# Patient Record
Sex: Female | Born: 1950 | Race: White | Hispanic: No | Marital: Single | Smoking: Never smoker
Health system: Southern US, Community
[De-identification: ages and names within clinical notes are randomized; demographics above are authoritative.]

## PROBLEM LIST (undated history)

## (undated) DIAGNOSIS — E785 Hyperlipidemia, unspecified: Secondary | ICD-10-CM

## (undated) DIAGNOSIS — T7840XA Allergy, unspecified, initial encounter: Secondary | ICD-10-CM

## (undated) DIAGNOSIS — Z923 Personal history of irradiation: Secondary | ICD-10-CM

## (undated) DIAGNOSIS — H9190 Unspecified hearing loss, unspecified ear: Secondary | ICD-10-CM

## (undated) DIAGNOSIS — K219 Gastro-esophageal reflux disease without esophagitis: Secondary | ICD-10-CM

## (undated) DIAGNOSIS — M1990 Unspecified osteoarthritis, unspecified site: Secondary | ICD-10-CM

## (undated) DIAGNOSIS — H269 Unspecified cataract: Secondary | ICD-10-CM

## (undated) DIAGNOSIS — D126 Benign neoplasm of colon, unspecified: Secondary | ICD-10-CM

## (undated) DIAGNOSIS — Z9221 Personal history of antineoplastic chemotherapy: Secondary | ICD-10-CM

## (undated) DIAGNOSIS — K648 Other hemorrhoids: Secondary | ICD-10-CM

## (undated) DIAGNOSIS — IMO0002 Reserved for concepts with insufficient information to code with codable children: Secondary | ICD-10-CM

## (undated) DIAGNOSIS — IMO0001 Reserved for inherently not codable concepts without codable children: Secondary | ICD-10-CM

## (undated) DIAGNOSIS — Z86010 Personal history of colonic polyps: Principal | ICD-10-CM

## (undated) DIAGNOSIS — C801 Malignant (primary) neoplasm, unspecified: Secondary | ICD-10-CM

## (undated) DIAGNOSIS — Z973 Presence of spectacles and contact lenses: Secondary | ICD-10-CM

## (undated) DIAGNOSIS — I10 Essential (primary) hypertension: Secondary | ICD-10-CM

## (undated) DIAGNOSIS — E789 Disorder of lipoprotein metabolism, unspecified: Secondary | ICD-10-CM

## (undated) DIAGNOSIS — K5790 Diverticulosis of intestine, part unspecified, without perforation or abscess without bleeding: Secondary | ICD-10-CM

## (undated) DIAGNOSIS — M25562 Pain in left knee: Secondary | ICD-10-CM

## (undated) HISTORY — DX: Unspecified cataract: H26.9

## (undated) HISTORY — DX: Reserved for concepts with insufficient information to code with codable children: IMO0002

## (undated) HISTORY — DX: Other hemorrhoids: K64.8

## (undated) HISTORY — DX: Pain in left knee: M25.562

## (undated) HISTORY — DX: Allergy, unspecified, initial encounter: T78.40XA

## (undated) HISTORY — DX: Diverticulosis of intestine, part unspecified, without perforation or abscess without bleeding: K57.90

## (undated) HISTORY — PX: KNEE SURGERY: SHX244

## (undated) HISTORY — PX: POLYPECTOMY: SHX149

## (undated) HISTORY — DX: Reserved for inherently not codable concepts without codable children: IMO0001

## (undated) HISTORY — PX: UPPER GASTROINTESTINAL ENDOSCOPY: SHX188

## (undated) HISTORY — DX: Personal history of colonic polyps: Z86.010

## (undated) HISTORY — DX: Disorder of lipoprotein metabolism, unspecified: E78.9

## (undated) HISTORY — DX: Benign neoplasm of colon, unspecified: D12.6

## (undated) HISTORY — PX: TONSILLECTOMY AND ADENOIDECTOMY: SHX28

## (undated) HISTORY — DX: Essential (primary) hypertension: I10

## (undated) HISTORY — PX: BREAST LUMPECTOMY: SHX2

## (undated) HISTORY — PX: HEMORRHOID SURGERY: SHX153

## (undated) HISTORY — DX: Hyperlipidemia, unspecified: E78.5

## (undated) HISTORY — PX: TREATMENT FISTULA ANAL: SUR1390

## (undated) HISTORY — PX: BREAST EXCISIONAL BIOPSY: SUR124

## (undated) HISTORY — PX: OTHER SURGICAL HISTORY: SHX169

---

## 1996-09-06 ENCOUNTER — Encounter: Payer: Self-pay | Admitting: Orthopedic Surgery

## 1997-12-05 ENCOUNTER — Ambulatory Visit (HOSPITAL_COMMUNITY): Admission: RE | Admit: 1997-12-05 | Discharge: 1997-12-05 | Payer: Self-pay | Admitting: Obstetrics & Gynecology

## 1998-06-11 ENCOUNTER — Ambulatory Visit (HOSPITAL_COMMUNITY): Admission: RE | Admit: 1998-06-11 | Discharge: 1998-06-11 | Payer: Self-pay | Admitting: Obstetrics & Gynecology

## 1998-06-11 ENCOUNTER — Encounter: Payer: Self-pay | Admitting: Obstetrics & Gynecology

## 1998-11-11 ENCOUNTER — Ambulatory Visit (HOSPITAL_COMMUNITY): Admission: RE | Admit: 1998-11-11 | Discharge: 1998-11-11 | Payer: Self-pay | Admitting: Obstetrics & Gynecology

## 1999-04-08 ENCOUNTER — Encounter: Payer: Self-pay | Admitting: Internal Medicine

## 1999-04-08 ENCOUNTER — Ambulatory Visit (HOSPITAL_COMMUNITY): Admission: RE | Admit: 1999-04-08 | Discharge: 1999-04-08 | Payer: Self-pay | Admitting: Internal Medicine

## 1999-05-01 ENCOUNTER — Encounter: Admission: RE | Admit: 1999-05-01 | Discharge: 1999-07-30 | Payer: Self-pay | Admitting: Internal Medicine

## 1999-06-18 ENCOUNTER — Ambulatory Visit (HOSPITAL_COMMUNITY): Admission: RE | Admit: 1999-06-18 | Discharge: 1999-06-18 | Payer: Self-pay | Admitting: Obstetrics & Gynecology

## 1999-06-18 ENCOUNTER — Encounter: Payer: Self-pay | Admitting: Obstetrics & Gynecology

## 1999-08-27 ENCOUNTER — Other Ambulatory Visit: Admission: RE | Admit: 1999-08-27 | Discharge: 1999-08-27 | Payer: Self-pay | Admitting: Obstetrics & Gynecology

## 2000-06-23 ENCOUNTER — Ambulatory Visit (HOSPITAL_COMMUNITY): Admission: RE | Admit: 2000-06-23 | Discharge: 2000-06-23 | Payer: Self-pay | Admitting: Obstetrics & Gynecology

## 2000-06-23 ENCOUNTER — Encounter: Payer: Self-pay | Admitting: Obstetrics & Gynecology

## 2000-07-06 HISTORY — PX: COLONOSCOPY: SHX174

## 2000-08-30 ENCOUNTER — Other Ambulatory Visit: Admission: RE | Admit: 2000-08-30 | Discharge: 2000-08-30 | Payer: Self-pay | Admitting: Obstetrics & Gynecology

## 2001-01-25 ENCOUNTER — Ambulatory Visit (HOSPITAL_COMMUNITY): Admission: RE | Admit: 2001-01-25 | Discharge: 2001-01-25 | Payer: Self-pay | Admitting: Gastroenterology

## 2001-06-24 ENCOUNTER — Encounter: Payer: Self-pay | Admitting: Obstetrics & Gynecology

## 2001-06-24 ENCOUNTER — Ambulatory Visit (HOSPITAL_COMMUNITY): Admission: RE | Admit: 2001-06-24 | Discharge: 2001-06-24 | Payer: Self-pay | Admitting: Obstetrics & Gynecology

## 2001-09-06 ENCOUNTER — Other Ambulatory Visit: Admission: RE | Admit: 2001-09-06 | Discharge: 2001-09-06 | Payer: Self-pay | Admitting: Obstetrics & Gynecology

## 2002-01-17 ENCOUNTER — Ambulatory Visit (HOSPITAL_COMMUNITY): Admission: RE | Admit: 2002-01-17 | Discharge: 2002-01-17 | Payer: Self-pay | Admitting: Internal Medicine

## 2002-07-19 ENCOUNTER — Ambulatory Visit (HOSPITAL_COMMUNITY): Admission: RE | Admit: 2002-07-19 | Discharge: 2002-07-19 | Payer: Self-pay | Admitting: Obstetrics & Gynecology

## 2002-07-19 ENCOUNTER — Encounter: Payer: Self-pay | Admitting: Obstetrics & Gynecology

## 2002-09-14 ENCOUNTER — Other Ambulatory Visit: Admission: RE | Admit: 2002-09-14 | Discharge: 2002-09-14 | Payer: Self-pay | Admitting: Obstetrics & Gynecology

## 2002-09-15 ENCOUNTER — Other Ambulatory Visit: Admission: RE | Admit: 2002-09-15 | Discharge: 2002-09-15 | Payer: Self-pay | Admitting: Obstetrics & Gynecology

## 2003-01-11 ENCOUNTER — Ambulatory Visit (HOSPITAL_COMMUNITY): Admission: RE | Admit: 2003-01-11 | Discharge: 2003-01-11 | Payer: Self-pay | Admitting: Internal Medicine

## 2003-01-11 ENCOUNTER — Encounter: Payer: Self-pay | Admitting: Internal Medicine

## 2003-07-25 ENCOUNTER — Ambulatory Visit (HOSPITAL_COMMUNITY): Admission: RE | Admit: 2003-07-25 | Discharge: 2003-07-25 | Payer: Self-pay | Admitting: Obstetrics & Gynecology

## 2003-07-31 ENCOUNTER — Ambulatory Visit (HOSPITAL_COMMUNITY): Admission: RE | Admit: 2003-07-31 | Discharge: 2003-07-31 | Payer: Self-pay | Admitting: Cardiology

## 2003-09-19 ENCOUNTER — Other Ambulatory Visit: Admission: RE | Admit: 2003-09-19 | Discharge: 2003-09-19 | Payer: Self-pay | Admitting: Obstetrics & Gynecology

## 2003-12-17 ENCOUNTER — Ambulatory Visit (HOSPITAL_COMMUNITY): Admission: RE | Admit: 2003-12-17 | Discharge: 2003-12-17 | Payer: Self-pay | Admitting: Internal Medicine

## 2004-07-25 ENCOUNTER — Ambulatory Visit (HOSPITAL_COMMUNITY): Admission: RE | Admit: 2004-07-25 | Discharge: 2004-07-25 | Payer: Self-pay | Admitting: Obstetrics & Gynecology

## 2004-09-25 ENCOUNTER — Other Ambulatory Visit: Admission: RE | Admit: 2004-09-25 | Discharge: 2004-09-25 | Payer: Self-pay | Admitting: Obstetrics & Gynecology

## 2005-07-27 ENCOUNTER — Ambulatory Visit (HOSPITAL_COMMUNITY): Admission: RE | Admit: 2005-07-27 | Discharge: 2005-07-27 | Payer: Self-pay | Admitting: Obstetrics & Gynecology

## 2006-08-03 ENCOUNTER — Ambulatory Visit (HOSPITAL_COMMUNITY): Admission: RE | Admit: 2006-08-03 | Discharge: 2006-08-03 | Payer: Self-pay | Admitting: Obstetrics & Gynecology

## 2006-08-18 ENCOUNTER — Ambulatory Visit (HOSPITAL_COMMUNITY): Admission: RE | Admit: 2006-08-18 | Discharge: 2006-08-18 | Payer: Self-pay | Admitting: Obstetrics & Gynecology

## 2006-11-09 ENCOUNTER — Emergency Department (HOSPITAL_COMMUNITY): Admission: EM | Admit: 2006-11-09 | Discharge: 2006-11-09 | Payer: Self-pay | Admitting: Emergency Medicine

## 2007-07-05 ENCOUNTER — Ambulatory Visit (HOSPITAL_COMMUNITY): Admission: RE | Admit: 2007-07-05 | Discharge: 2007-07-05 | Payer: Self-pay | Admitting: Internal Medicine

## 2007-08-23 ENCOUNTER — Ambulatory Visit (HOSPITAL_COMMUNITY): Admission: RE | Admit: 2007-08-23 | Discharge: 2007-08-23 | Payer: Self-pay | Admitting: Obstetrics & Gynecology

## 2007-08-26 ENCOUNTER — Emergency Department (HOSPITAL_COMMUNITY): Admission: EM | Admit: 2007-08-26 | Discharge: 2007-08-26 | Payer: Self-pay | Admitting: Emergency Medicine

## 2008-07-06 HISTORY — PX: BREAST EXCISIONAL BIOPSY: SUR124

## 2008-08-24 ENCOUNTER — Ambulatory Visit (HOSPITAL_COMMUNITY): Admission: RE | Admit: 2008-08-24 | Discharge: 2008-08-24 | Payer: Self-pay | Admitting: Obstetrics & Gynecology

## 2009-05-07 ENCOUNTER — Ambulatory Visit: Payer: Self-pay | Admitting: Orthopedic Surgery

## 2009-05-07 DIAGNOSIS — M23329 Other meniscus derangements, posterior horn of medial meniscus, unspecified knee: Secondary | ICD-10-CM

## 2009-05-07 DIAGNOSIS — M23302 Other meniscus derangements, unspecified lateral meniscus, unspecified knee: Secondary | ICD-10-CM

## 2009-05-08 ENCOUNTER — Ambulatory Visit (HOSPITAL_COMMUNITY): Admission: RE | Admit: 2009-05-08 | Discharge: 2009-05-08 | Payer: Self-pay | Admitting: Orthopedic Surgery

## 2009-05-09 ENCOUNTER — Ambulatory Visit: Payer: Self-pay | Admitting: Orthopedic Surgery

## 2009-06-11 ENCOUNTER — Ambulatory Visit: Payer: Self-pay | Admitting: Orthopedic Surgery

## 2009-06-11 DIAGNOSIS — M23302 Other meniscus derangements, unspecified lateral meniscus, unspecified knee: Secondary | ICD-10-CM | POA: Insufficient documentation

## 2009-06-11 DIAGNOSIS — M1710 Unilateral primary osteoarthritis, unspecified knee: Secondary | ICD-10-CM

## 2009-06-17 ENCOUNTER — Encounter (INDEPENDENT_AMBULATORY_CARE_PROVIDER_SITE_OTHER): Payer: Self-pay | Admitting: *Deleted

## 2009-06-18 ENCOUNTER — Ambulatory Visit: Payer: Self-pay | Admitting: Orthopedic Surgery

## 2009-06-18 ENCOUNTER — Ambulatory Visit (HOSPITAL_COMMUNITY): Admission: RE | Admit: 2009-06-18 | Discharge: 2009-06-18 | Payer: Self-pay | Admitting: Orthopedic Surgery

## 2009-06-24 ENCOUNTER — Ambulatory Visit: Payer: Self-pay | Admitting: Orthopedic Surgery

## 2009-07-02 ENCOUNTER — Encounter: Payer: Self-pay | Admitting: Orthopedic Surgery

## 2009-07-04 ENCOUNTER — Encounter: Admission: RE | Admit: 2009-07-04 | Discharge: 2009-07-04 | Payer: Self-pay | Admitting: Orthopedic Surgery

## 2009-07-04 ENCOUNTER — Encounter: Payer: Self-pay | Admitting: Orthopedic Surgery

## 2009-07-09 ENCOUNTER — Ambulatory Visit: Payer: Self-pay | Admitting: Orthopedic Surgery

## 2009-07-09 ENCOUNTER — Telehealth: Payer: Self-pay | Admitting: Orthopedic Surgery

## 2009-07-09 DIAGNOSIS — M25469 Effusion, unspecified knee: Secondary | ICD-10-CM | POA: Insufficient documentation

## 2009-07-11 ENCOUNTER — Encounter: Admission: RE | Admit: 2009-07-11 | Discharge: 2009-09-13 | Payer: Self-pay | Admitting: Orthopedic Surgery

## 2009-07-22 ENCOUNTER — Encounter: Payer: Self-pay | Admitting: Orthopedic Surgery

## 2009-07-23 ENCOUNTER — Encounter: Payer: Self-pay | Admitting: Orthopedic Surgery

## 2009-07-24 ENCOUNTER — Ambulatory Visit: Payer: Self-pay | Admitting: Orthopedic Surgery

## 2009-07-25 ENCOUNTER — Telehealth: Payer: Self-pay | Admitting: Orthopedic Surgery

## 2009-07-31 ENCOUNTER — Encounter: Payer: Self-pay | Admitting: Orthopedic Surgery

## 2009-08-29 ENCOUNTER — Ambulatory Visit (HOSPITAL_COMMUNITY): Admission: RE | Admit: 2009-08-29 | Discharge: 2009-08-29 | Payer: Self-pay | Admitting: Obstetrics & Gynecology

## 2009-10-29 ENCOUNTER — Ambulatory Visit: Payer: Self-pay | Admitting: Orthopedic Surgery

## 2009-11-26 ENCOUNTER — Ambulatory Visit: Payer: Self-pay | Admitting: Orthopedic Surgery

## 2010-01-08 ENCOUNTER — Ambulatory Visit: Payer: Self-pay | Admitting: Orthopedic Surgery

## 2010-01-09 ENCOUNTER — Telehealth: Payer: Self-pay | Admitting: Orthopedic Surgery

## 2010-01-10 ENCOUNTER — Ambulatory Visit (HOSPITAL_COMMUNITY): Admission: RE | Admit: 2010-01-10 | Discharge: 2010-01-10 | Payer: Self-pay | Admitting: Orthopedic Surgery

## 2010-01-28 ENCOUNTER — Ambulatory Visit: Payer: Self-pay | Admitting: Orthopedic Surgery

## 2010-01-28 DIAGNOSIS — M23349 Other meniscus derangements, anterior horn of lateral meniscus, unspecified knee: Secondary | ICD-10-CM | POA: Insufficient documentation

## 2010-02-06 ENCOUNTER — Telehealth (INDEPENDENT_AMBULATORY_CARE_PROVIDER_SITE_OTHER): Payer: Self-pay | Admitting: *Deleted

## 2010-02-06 ENCOUNTER — Encounter: Payer: Self-pay | Admitting: Orthopedic Surgery

## 2010-02-11 ENCOUNTER — Encounter (INDEPENDENT_AMBULATORY_CARE_PROVIDER_SITE_OTHER): Payer: Self-pay | Admitting: *Deleted

## 2010-02-17 ENCOUNTER — Encounter (INDEPENDENT_AMBULATORY_CARE_PROVIDER_SITE_OTHER): Payer: Self-pay | Admitting: *Deleted

## 2010-02-19 ENCOUNTER — Encounter: Payer: Self-pay | Admitting: Orthopedic Surgery

## 2010-02-24 ENCOUNTER — Encounter: Payer: Self-pay | Admitting: Orthopedic Surgery

## 2010-02-25 ENCOUNTER — Ambulatory Visit (HOSPITAL_COMMUNITY): Admission: RE | Admit: 2010-02-25 | Discharge: 2010-02-25 | Payer: Self-pay | Admitting: Orthopedic Surgery

## 2010-02-25 ENCOUNTER — Ambulatory Visit: Payer: Self-pay | Admitting: Orthopedic Surgery

## 2010-02-26 ENCOUNTER — Encounter: Payer: Self-pay | Admitting: Orthopedic Surgery

## 2010-02-27 ENCOUNTER — Ambulatory Visit: Payer: Self-pay | Admitting: Orthopedic Surgery

## 2010-02-27 DIAGNOSIS — Z9889 Other specified postprocedural states: Secondary | ICD-10-CM | POA: Insufficient documentation

## 2010-02-28 ENCOUNTER — Encounter (HOSPITAL_COMMUNITY): Admission: RE | Admit: 2010-02-28 | Discharge: 2010-03-30 | Payer: Self-pay | Admitting: Orthopedic Surgery

## 2010-03-03 ENCOUNTER — Encounter: Admission: RE | Admit: 2010-03-03 | Discharge: 2010-06-01 | Payer: Self-pay | Admitting: Orthopedic Surgery

## 2010-03-05 ENCOUNTER — Encounter: Payer: Self-pay | Admitting: Orthopedic Surgery

## 2010-03-12 ENCOUNTER — Encounter: Payer: Self-pay | Admitting: Orthopedic Surgery

## 2010-03-17 ENCOUNTER — Encounter: Payer: Self-pay | Admitting: Orthopedic Surgery

## 2010-03-19 ENCOUNTER — Ambulatory Visit: Payer: Self-pay | Admitting: Orthopedic Surgery

## 2010-03-20 ENCOUNTER — Telehealth: Payer: Self-pay | Admitting: Orthopedic Surgery

## 2010-03-20 ENCOUNTER — Encounter (INDEPENDENT_AMBULATORY_CARE_PROVIDER_SITE_OTHER): Payer: Self-pay | Admitting: *Deleted

## 2010-04-16 ENCOUNTER — Ambulatory Visit: Payer: Self-pay | Admitting: Orthopedic Surgery

## 2010-04-16 ENCOUNTER — Encounter (INDEPENDENT_AMBULATORY_CARE_PROVIDER_SITE_OTHER): Payer: Self-pay | Admitting: *Deleted

## 2010-04-21 ENCOUNTER — Encounter: Payer: Self-pay | Admitting: Orthopedic Surgery

## 2010-07-06 HISTORY — PX: KNEE SURGERY: SHX244

## 2010-07-16 ENCOUNTER — Ambulatory Visit
Admission: RE | Admit: 2010-07-16 | Discharge: 2010-07-16 | Payer: Self-pay | Source: Home / Self Care | Attending: Orthopedic Surgery | Admitting: Orthopedic Surgery

## 2010-07-27 ENCOUNTER — Encounter: Payer: Self-pay | Admitting: Obstetrics & Gynecology

## 2010-07-27 ENCOUNTER — Encounter: Payer: Self-pay | Admitting: Gastroenterology

## 2010-08-05 NOTE — Assessment & Plan Note (Signed)
Summary: NEW PROB/LT KNEE PAIN/NEEDS XRAY/UMR/CAF   Vital Signs:  Patient profile:   60 year old female Weight:      217 pounds  Visit Type:  new problem Referring Provider:  self Primary Provider:  Dr. Nila Nephew  CC:  left knee pain.  History of Present Illness: I saw April Walker in the office today for a  visit.  She is a 60 years old woman with the complaint of: left knee pain.  Xrays today in our office.  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  She has sharp throbbing pain which reaches the level of an 8.  It is intermittent it came on gradually is better when she is elevating the leg and worse when she is walking.  She complains of catching and swelling.  She is already on an anti-inflammatory     Allergies: 1)  ! Sulfa 2)  ! * Actifed 3)  ! 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 Cardiovascular:  Denies chest pain, palpitations, fainting, and murmurs. Respiratory:  Denies short of breath, wheezing, couch, tightness, pain on inspiration, and snoring . Gastrointestinal:  Denies heartburn, nausea, vomiting, diarrhea, constipation, and blood in your stools. Genitourinary:  Denies frequency, urgency, difficulty urinating, painful urination, flank pain, and bleeding in urine. Neurologic:  Denies numbness, tingling, unsteady gait, dizziness, tremors, and seizure. Musculoskeletal:  Denies joint pain, swelling, instability,  stiffness, redness, heat, and muscle pain. Endocrine:  Denies excessive thirst, exessive urination, and heat or cold intolerance. Psychiatric:  Denies nervousness, depression, anxiety, and hallucinations. Skin:  Denies changes in the skin, poor healing, rash, itching, and redness. HEENT:  Denies blurred or double vision, eye pain, redness, and watering. Immunology:  Complains of seasonal allergies; denies sinus problems and allergic to bee stings. Hemoatologic:  Denies easy bleeding and brusing.  Physical Exam  Msk:  The patient is well developed and nourished, with normal grooming and hygiene. The body habitus is   medium/large Pulses:  The pulses and perfusion were normal with normal color, temperature  and no swelling  Extremities:  upper extremities I see no abnormalities, no contracture no subluxation no atrophy no tremor  LEFT lower extremity is medial joint line tenderness patellofemoral crepitus no effusion, range of motion normal, strength normal, stability normal, Murray sign seems positive. Neurologic:  The coordination and sensation were normal  The reflexes were normal   Skin:  intact without lesions or rashes Inguinal Nodes:  no significant adenopathy Psych:  alert and cooperative; normal mood and affect; normal attention span and concentration   Impression & Recommendations:  Problem # 1:  KNEE, ARTHRITIS, DEGEN./OSTEO (ICD-715.96) Assessment New  LEFT knee Her updated medication list for this problem includes:    Lorcet Plus 7.5-650 Mg Tabs (Hydrocodone-acetaminophen) .Marland Kitchen... 1 by mouth q 4 as needed pain    Diclofenac Potassium 50 Mg Tabs (Diclofenac potassium) .Marland Kitchen... 1 by mouth two times a day  Orders: Est. Patient Level IV (45409) Knee x-ray,  3 views (81191) Joint Aspirate / Injection, Large (  [address]) Depo- Medrol 40mg  (J1030)  Problem # 2:  DERANGEMENT MENISCUS (ICD-717.5) Assessment: New  LEFT knee  X-rays 3 views LEFT knee show medial joint space  narrowing with bone to bone changes varus deformity.  Patellofemoral joint looks moderately arthritic.  Orders: Est. Patient Level IV (60454) Knee x-ray,  3 views (09811) LEFT knee joint injection Verbal consent was obtained. The knee was prepped with alcohol and ethyl chloride. 1 cc of depomedrol 40mg /cc and 4 cc of lidocaine 1% was injected. there were no complications.  Patient Instructions: 1)  You have received an injection of cortisone today. You may experience increased pain at the injection site. Apply ice pack to the area for 20 minutes every 2 hours and take 2 xtra strength tylenol every 8 hours. This increased pain will usually resolve in 24 hours. The injection will take effect in 3-10 days.  2)  MRI left knee f/u with film

## 2010-08-05 NOTE — Miscellaneous (Signed)
Summary: PT Clinical evaluation  PT Clinical evaluation   Imported By: Jacklynn Ganong 03/05/2010 08:11:54  _____________________________________________________________________  External Attachment:    Type:   Image     Comment:   External Document

## 2010-08-05 NOTE — Letter (Signed)
Summary: Work [facility]  [address]  [place], [place] [address]   Phone: [phone]  Fax: [phone]     Today's Date: October 29, 2009  Name of Patient: April Walker [facility]  The above named patient had a medical visit today at:  4:15 pm.  Please take the following into consideration:.    Special Instructions:  [ X ] To be returning to work today, 10/29/09, following appointment.   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   Sincerely yours,   Terrance Mass, MD

## 2010-08-05 NOTE — Progress Notes (Signed)
Summary: Phone calls following appointment today  Phone Note Outgoing Call   Call placed to: Specialist Summary of Call: (1) Called [facility] therapy dept @[address] location, ph [phone], per Dr Romeo Apple, and cancelled pt's appointment there for today; spoke w/ Vernona Rieger; okay for Thursday as scheduled. (2) Faxed work note to [employer], [place] dept fax [phone], per patient request. Initial call taken by: Cammie Sickle,  July 09, 2009 4:01 PM

## 2010-08-05 NOTE — Letter (Signed)
Summary: Medical office notes DOS [phone] fax [facility]  Medical office notes DOS [phone] fax [facility]   Imported By: Cammie Sickle 07/23/2009 17:24:47  _____________________________________________________________________  External Attachment:    Type:   Image     Comment:   External Document

## 2010-08-05 NOTE — Progress Notes (Signed)
Summary: note for work  Phone Note Call from Patient   Summary of Call: Patient called and would like her letter faxed to her supervisor at [phone] attn. Diane Coad.  She needs the out of work note, stating how long she is out of work.  Thanks Initial call taken by: Curtis Sites,  March 20, 2010 8:56 AM  Follow-up for Phone Call        Done, as we have authorization on file. Follow-up by: Cammie Sickle,  March 20, 2010 9:40 AM

## 2010-08-05 NOTE — Miscellaneous (Signed)
Summary: Rehab Report  Rehab Report   Imported By: Cammie Sickle 03/19/2010 18:12:02  _____________________________________________________________________  External Attachment:    Type:   Image     Comment:   External Document

## 2010-08-05 NOTE — Assessment & Plan Note (Signed)
Summary: RE-CK LT KNEE/POST OP SURG 02/25/10/UMR/CAF   Visit Type:  Follow-up Referring Keyonta Barradas:  self Primary Akyah Lagrange:  Dr. Nila Nephew  CC:  post op knee.  History of Present Illness:   She is a 60 years old woman with the complaint of:  post op left knee, SALK, PMM, Limited Synovectomy.  DOS 02/25/10.  Diclofenac bid Norco 5 only as needed.  OOW until Oct 17th  Today is 4 week recheck after PT.  Doing well, finished PT.  Her knee looks really good.  He moves free and easy and has near full range of motion.  There is no pain or crepitance with range of motion and she does not have a joint effusion  Excellent progress status post arthroscopy patient will start work on October 17 followup in about 3 months       Allergies: 1)  ! Sulfa 2)  ! * Actifed 3)  ! Hydrocodone   Other Orders: Post-Op Check ([address])  Patient Instructions: 1)  OK TO RTW 10 17 2011 2)  RETURN IN 3 MONTHS

## 2010-08-05 NOTE — Letter (Signed)
Summary: Out of Work  [facility]  [address]  [place], [place] [address]   Phone: [phone]  Fax: [phone]    July 09, 2009   Employee:  April Walker [facility]    To Whom It May Concern:   For Medical reasons, please excuse the above named employee from work for the following dates:  Start:   July 09, 2009  End:   July 24, 2009  Approximate return to work date:  July 31, 2009  If you need additional information, please feel free to contact our office.         Sincerely,    Terrance Mass, MD

## 2010-08-05 NOTE — Progress Notes (Signed)
Summary: call from patient re-schedule surgery date   Phone Note Call from Patient   Caller: Patient Summary of Call: Patient called back to relay that she needs to re-schedule surgery date from 02/11/10 to 02/18/10 if possible due to work schedule.  Please call at work ph # [phone] Initial call taken by: Cammie Sickle,  February 06, 2010 10:30 AM  Follow-up for Phone Call        rescheduled to 02/18/10 at 1020am, preop is 02/14/10 at 2:15pm, post op 1 in our office 10:30am on 02/20/10 Follow-up by: Ether Griffins,  February 06, 2010 10:44 AM

## 2010-08-05 NOTE — Letter (Signed)
Summary: Out of Work  [facility]  [address]  [place], [place] [address]   Phone: [phone]  Fax: [phone]    April 16, 2010   Employee:  April Walker [facility]    To Whom It May Concern:   For Medical reasons, please note that the above named patient/employee is to continue out of work as follows, and return to full duty work as follows, as per appointment in our office today.:   Start:   04/16/10  End/Return to work, no restrictions:  04/21/10  If you need additional information, please feel free to contact our office.         Sincerely,    Terrance Mass, MD

## 2010-08-05 NOTE — Letter (Signed)
Summary: History form  History form   Imported By: Jacklynn Ganong 01/13/2010 13:27:58  _____________________________________________________________________  External Attachment:    Type:   Image     Comment:   External Document

## 2010-08-05 NOTE — Miscellaneous (Signed)
Summary: PT Progress note  PT Progress note   Imported By: Jacklynn Ganong 04/21/2010 08:16:25  _____________________________________________________________________  External Attachment:    Type:   Image     Comment:   External Document

## 2010-08-05 NOTE — Assessment & Plan Note (Signed)
Summary: 1 m RE-CK LT KNEE/UMR/CAF   Visit Type:  Follow-up  CC:  recheck right knee.  History of Present Illness: a 60 year old female had arthroscopy of the RIGHT knee on June 18, 2009; 5 months postop  Procedure  arthroscopy, partial medial menisectomy, partial lateral menisectomy, chondroplasty, medial femoral condyle, chondroplasty lateral femoral condyle, and extensive synovectomy.   Medication  Ibuprofen 800 as needed, Lorcet plus made her hallucinate.   Today is a one month fu after Ibuprofen increase. 1 by mouth:  q8 to q 6   She has stiffness and pain right knee.  Has swelling left foot, no injury, just over compensation with weightbearing on the LEFT.                     Allergies: 1)  ! Sulfa 2)  ! * Actifed 3)  ! Hydrocodone  Review of Systems Musculoskeletal:  Complains of joint pain, swelling, stiffness, and muscle pain; denies instability, redness, and heat.   Knee Exam  General:    Well-developed, well-nourished, normal body habitus; no deformities, normal grooming.  Gait:    Normal heel-toe gait pattern bilaterally.    Skin:    Intact, no scars, lesions, rashes, cafe au lait spots, or bruising.    Inspection:    swelling: RIGHT knee joint  Palpation:    lateral joint line tenderness  Vascular:    There was no swelling or varicose veins. The pulses and temperature are normal. There was no edema or tenderness.  Sensory:    Gross coordination and sensation were normal.    Motor:    Motor strength 5/5 bilaterally for quadriceps, hamstrings, ankle dorsiflexion, and ankle plantar flexion.    Knee Exam:    Right:    Inspection:  Abnormal    Palpation:  Abnormal    Stability:  stable    flexion work of 120   Impression & Recommendations:  Problem # 1:  AFTERCARE FOLLOW SURGERY MUSCULOSKEL SYSTEM NEC (ICD-V58.78) Assessment New  Orders: Est. Patient Level III (42595) Joint Aspirate / Injection, Large  (20610) Depo- Medrol 40mg  (J1030)  Problem # 2:  JOINT EFFUSION, KNEE (ICD-719.06) Assessment: New  recurrent joint effusion RIGHT knee  Aspirated RIGHT knee 50 cc of fluid, injected Depo-Medrol Verbal consent was obtained. The knee was prepped with alcohol and ethyl chloride. 1 cc of depomedrol 40mg /cc and 4 cc of lidocaine 1% was injected. there were no complications.  Orders: Est. Patient Level III (63875) Joint Aspirate / Injection, Large (20610) Depo- Medrol 40mg  (J1030)  Medications Added to Medication List This Visit: 1)  Diclofenac Potassium 50 Mg Tabs (Diclofenac potassium) .Marland Kitchen.. 1 by mouth two times a day  Patient Instructions: 1)  Try an over the counter support hose for the swelling in the ankles  2)  You have received an injection of cortisone today. You may experience increased pain at the injection site. Apply ice pack to the area for 20 minutes every 2 hours and take 2 xtra strength tylenol every 8 hours. This increased pain will usually resolve in 24 hours. The injection will take effect in 3-10 days.  3)  Take diclofenac 50 two times a day  4)  return in 2 months  Prescriptions: DICLOFENAC POTASSIUM 50 MG TABS (DICLOFENAC POTASSIUM) 1 by mouth two times a day  #60 x 1   Entered and Authorized by:   [name] MD   Signed by:   [name] MD on 11/26/2009   Method used:  Print then Give to Patient   RxID:   [id number]

## 2010-08-05 NOTE — Assessment & Plan Note (Signed)
Summary: 2 M RE-CK RT KNEE/MRI RESULTS LEFT KNEE/UMR/BSF   Visit Type:  Follow-up Referring Provider:  self Primary Provider:  Dr. Nila Nephew  CC:  left knee pain.  History of Present Illness: I saw April Walker in the office today for a  visit.  She is a 60 years old woman with the complaint of: left knee pain.  Xrays today in our office.  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  She has sharp throbbing pain which reaches the level of an 8.  It is intermittent it came on gradually is better when she is elevating the leg and worse when she is walking.  She complains of catching and swelling.  She is already on an anti-inflammatory   MRI was done to evaluate LEFT knee:  1.  Tricompartmental degenerative changes, most significant medially. 2.  Intact ligamentous structures and no acute bony findings. 3.  Medial and lateral meniscal tears. 4.  Moderate to large joint effusion and moderate sized, partially ruptured, Baker's cyst. 5.  Pes anserinus bursitis.  She complains that her knee doesn't feel good.    Allergies: 1)  ! Sulfa 2)  ! * Actifed 3)  ! Hydrocodone  Physical Exam  Additional Exam:   * VS reviewed and were normal   *GEN: appearance was normal   ** CDV: normal pulses temperature and no edema  * LYMPH nodes were normal   * SKIN was normal   * Neuro: normal sensation ** Psyche: AAO x 3 and mood was normal   MSK *Gait was normal  *Inspection LEFT knee tenderness over the medial compartment. *ROM knee flexion 125 mild flexion contracture *Motor 5/5 *Stability normal special tests include McMurray's sign which is positive  RIGHT knee mild tenderness medial joint line full range of motion except for mild flexion contracture motor exam normal knee  stable   The upper extremities have normal appearance, ROM, strength and stability.     Impression & Recommendations:  Problem # 1:  KNEE, ARTHRITIS, DEGEN./OSTEO (ICD-715.96)  Her updated medication list for this problem includes:    Lorcet Plus 7.5-650 Mg Tabs (Hydrocodone-acetaminophen) .Marland Kitchen... 1 by mouth q 4 as needed pain    Diclofenac Potassium 50 Mg Tabs (Diclofenac potassium) .Marland Kitchen... 1 by mouth two times a day  Orders: Est. Patient Level IV ([address]109)  Problem # 2:  DERANGEMENT OF POSTERIOR HORN OF MEDIAL MENISCUS (ICD-717.2)  Orders: Est. Patient Level IV (60454)  Problem # 3:  DERANGEMENT OF ANTERIOR HORN OF LATERAL MENISCUS (ICD-717.42)  recommend: SALK medial and lateral  menisectomy  As the patient has undergone this procedure before we just reviewed again were going to do in the risk and complications she agrees to have the surgery, although we expect some mild discomfort afterwards we should be able to help her with some of her mechanical symptoms and improve her ability to walk.  Orders: Est. Patient Level IV (09811)  Patient Instructions: 1)  call to schedule  2)  will not need appt just pick up packet  Prescriptions: DICLOFENAC POTASSIUM 50 MG TABS (DICLOFENAC POTASSIUM) 1 by mouth two times a day  #60 x 1   Entered and Authorized by:   [name] MD   Signed by:   [name] MD on 01/28/2010   Method used:  Print then Give to Patient   RxID:   [id number] LORCET PLUS 7.5-650 MG TABS (HYDROCODONE-ACETAMINOPHEN) 1 by mouth q 4 as needed pain  #60 x 1   Entered and Authorized by:   [name] MD   Signed by:   [name] MD on 01/28/2010   Method used:   Print then Give to Patient   RxID:   [id number]

## 2010-08-05 NOTE — Miscellaneous (Signed)
Summary: reschedule surgery to 02/25/10   Clinical Lists Changes    post op 1 is now 02/27/10 at 930 am, preop is 02/20/10 at 845am

## 2010-08-05 NOTE — Miscellaneous (Signed)
Summary: PT Discharge Summary  PT Discharge Summary   Imported By: Jacklynn Ganong 08/08/2009 16:56:43  _____________________________________________________________________  External Attachment:    Type:   Image     Comment:   External Document

## 2010-08-05 NOTE — Letter (Signed)
Summary: FMLA form  FMLA form   Imported By: Cammie Sickle 03/18/2010 18:04:47  _____________________________________________________________________  External Attachment:    Type:   Image     Comment:   External Document

## 2010-08-05 NOTE — Progress Notes (Signed)
Summary: MRI appointment.  Phone Note Outgoing Call   Call placed by: Waldon Reining,  January 09, 2010 3:31 PM Call placed to: Patient Action Taken: Appt scheduled Summary of Call: I called to give the patient her MRI appointment at [facility] on 01-10-10 at 3:30. Patient has UMR, no precert required. She will follow up here for results.

## 2010-08-05 NOTE — Miscellaneous (Signed)
Summary: call back to [employer],no prior auth req'd for out-pt proc  Clinical Lists Changes  Call back to insurer [employer] re: pre-cert for re-scheduled date of 02/25/10 for out-pt surgery at [facility], [place] [id number].  Left voice message as requested on automated system at 4:58pm Followed up and reached representative re: updated surgery date, spoke w/Lisa.  Ref # [id number]

## 2010-08-05 NOTE — Assessment & Plan Note (Signed)
Summary: post op 1 left knee.cbt   Visit Type:  Follow-up Referring Provider:  self Primary Provider:  Dr. Nila Nephew  CC:  post op 1 left knee.  History of Present Illness: I saw April Walker in the office today for a followup visit.  She is a 60 years old woman with the complaint of:  post op 1 left knee, SALK, PMM, Limited Synovectomy.  DOS 02/25/10.  POD 2.  Today for dressing change, check wound.  Lorcet plus for pain, 1 by mouth q 4 hrs.  Tomorrow starting PT [facility], to switch to [facility].  Walking toe touch with crutches.  doing ok   knee looks good   return 3 weeks       Allergies: 1)  ! Sulfa 2)  ! * Actifed 3)  ! Hydrocodone   Impression & Recommendations:  Problem # 1:  DERANGEMENT OF POSTERIOR HORN OF MEDIAL MENISCUS (ICD-717.2) Assessment Comment Only  Orders: Post-Op Check (78295)  Problem # 2:  KNEE, ARTHRITIS, DEGEN./OSTEO (ICD-715.96) Assessment: Comment Only  Her updated medication list for this problem includes:    Lorcet Plus 7.5-650 Mg Tabs (Hydrocodone-acetaminophen) .Marland Kitchen... 1 by mouth q 4 as needed pain    Diclofenac Potassium 50 Mg Tabs (Diclofenac potassium) .Marland Kitchen... 1 by mouth two times a day  Orders: Post-Op Check (62130)  Problem # 3:  ARTHROSCOPY, KNEE, HX OF (ICD-V45.89) Assessment: Comment Only  Orders: Post-Op Check (86578)  Patient Instructions: 1)  Please schedule a follow-up appointment in 3 weeks.

## 2010-08-05 NOTE — Assessment & Plan Note (Signed)
Summary: 2 WK RE-CK RT KNEE/POST OP SURG 06/18/09/UMR/CAF   Visit Type:  Follow-up  CC:  knee pain .  History of Present Illness: I saw April Walker in the office today for a followup visit.  She is a 60 years old woman with the complaint of:  DOS right knee 06/18/09.  5 weeks out   Procedure  arthroscopy, partial medial menisectomy, partial lateral menisectomy, chondroplasty, medial femoral condyle, chondroplasty lateral femoral condyle, and extensive synovectomy.   Medication Lorcet plus and Ibuprofen, takes Ibuprofen, does not take Lorcet much.  Subjectives postop 3, recheck right knee after aspiration, injection and rest.  she has good days and bad days but the good days are out numbering the bad days.  She does have some mediolateral discomfort.  She is ready to go back to work.  Her knee looks pretty good there is minimal swelling good flexion extension.  Good quadriceps tone  Assessment progressing well status post arthroscopy of the RIGHT knee with extensive synovectomy and chondroplasties and partial medial lateral meniscectomy  Return to work after completing therapy                Allergies: 1)  ! Sulfa 2)  ! * Actifed   Impression & Recommendations:  Problem # 1:  AFTERCARE FOLLOW SURGERY MUSCULOSKEL SYSTEM NEC (ICD-V58.78) Assessment Improved  Orders: Post-Op Check (23762)  Problem # 2:  KNEE, ARTHRITIS, DEGEN./OSTEO (ICD-715.96) Assessment: Improved  Her updated medication list for this problem includes:    Lorcet Plus 7.5-650 Mg Tabs (Hydrocodone-acetaminophen) .Marland Kitchen... 1 by mouth q 4 as needed pain  Orders: Post-Op Check (83151)  Patient Instructions: 1)  RTW 26th  2)  finish therapy  3)  Continue the ibuprofen as needed  4)  return in 3 months

## 2010-08-05 NOTE — Letter (Signed)
Summary: Out of Work  [facility]  [address]  [place], [place] [address]   Phone: [phone]  Fax: [phone]    July 24, 2009   Employee:  April Walker [facility]    To Whom It May Concern:   For Medical reasons, please excuse the above named employee from work for the following dates:  Start:   July 24, 2009 (original start date out of work:  June 18, 2009)  End:   July 30, 2009  Medically cleared to return to work, full duty:   July 31, 2009   If you need additional information, please feel free to contact our office.         Sincerely,    Terrance Mass, MD

## 2010-08-05 NOTE — Assessment & Plan Note (Signed)
Summary: 3 WK RE-CK/POST OP 2/LT KNEE SURG 02/25/10/[facility]   Visit Type:  Follow-up Referring Provider:  self Primary Provider:  Dr. Nila Nephew  CC:  post op left knee.  History of Present Illness: I saw April Walker in the office today for a followup visit.  She is a 60 years old woman with the complaint of:  post op 1 left knee, SALK, PMM, Limited Synovectomy.  DOS 02/25/10.  POD 22  Diclofenac helps, no pain med needed.  Today is 3 week recheck left knee after PT.  Therapy ends thursday, note for review, needs to be signed.  OOW until Oct 18th   She looks really good. Her knee looks good. Her flexion is good. She has minimal swelling. No pain really  Followup in 4 weeks after finishing therapy    Allergies: 1)  ! Sulfa 2)  ! * Actifed 3)  ! Hydrocodone   Impression & Recommendations:  Problem # 1:  ARTHROSCOPY, KNEE, HX OF (ICD-V45.89)  Orders: Post-Op Check (40981)  Problem # 2:  DERANGEMENT OF ANTERIOR HORN OF LATERAL MENISCUS (ICD-717.42)  Orders: Post-Op Check (19147)  Problem # 3:  DERANGEMENT OF POSTERIOR HORN OF MEDIAL MENISCUS (ICD-717.2)  Orders: Post-Op Check (82956)  Problem # 4:  KNEE, ARTHRITIS, DEGEN./OSTEO (ICD-715.96)  Her updated medication list for this problem includes:    Lorcet Plus 7.5-650 Mg Tabs (Hydrocodone-acetaminophen) .Marland Kitchen... 1 by mouth q 4 as needed pain    Diclofenac Potassium 50 Mg Tabs (Diclofenac potassium) .Marland Kitchen... 1 by mouth two times a day  Orders: Post-Op Check (21308)  Patient Instructions: 1)  continue PT  2)  return in 4 weeks

## 2010-08-05 NOTE — Letter (Signed)
Summary: Out of Work  [facility]  [address]  [place], [place] [address]   Phone: [phone]  Fax: [phone]    March 20, 2010   Employee:  April Walker [facility]    To Whom It May Concern:  The above named patient/employee was seen in our office on 03/19/10  for an appointment in our office.  For Medical reasons, please excuse the above named employee from work for the following dates:  Start:   February 24, 2010  End/Approximate Return to work date:   April 21, 2010   If you need additional information, please feel free to contact our office.         Sincerely,    Terrance Mass, MD

## 2010-08-05 NOTE — Miscellaneous (Signed)
Summary: No pre-authorization req'd for out-patient surgery  Clinical Lists Changes   Call to insurer [employer] re: out-patient surgery scheduled 02/18/10 at [facility], [place] [address], [phone] do not require pre-authorization if done as out-patient.  Spoke w/Mohammed, Office manager.  Ref # [id number].

## 2010-08-05 NOTE — Letter (Signed)
Summary: surgery order LT knee sched 02/18/10  surgery order LT knee sched 02/18/10   Imported By: Cammie Sickle 02/11/2010 10:10:23  _____________________________________________________________________  External Attachment:    Type:   Image     Comment:   External Document

## 2010-08-05 NOTE — Miscellaneous (Signed)
Summary: PT discharge summary  PT discharage summary   Imported By: Jacklynn Ganong 08/05/2009 11:20:15  _____________________________________________________________________  External Attachment:    Type:   Image     Comment:   External Document

## 2010-08-05 NOTE — Letter (Signed)
Summary: Out of Work  [facility]  [address]  [place], [place] [address]   Phone: [phone]  Fax: [phone]    February 19, 2010   Employee:  FERGIE SHERBERT [facility]    To Whom It May Concern:   For Medical reasons, please excuse the above named employee from work for the following dates:  Start:   February 24, 2010  End:   April 21, 2010 (approximate) - or until otherwise notified  If you need additional information, please feel free to contact our office.         Sincerely,    Terrance Mass, MD

## 2010-08-05 NOTE — Miscellaneous (Signed)
CC:  left knee pain.  History of Present Illness: I saw April Walker in the office today for a  visit.  She is a 60 years old woman with the complaint of: left knee pain.  Xrays today in our office.  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  She has sharp throbbing pain which reaches the level of an 8.  It is intermittent it came on gradually is better when she is elevating the leg and worse when she is walking.  She complains of catching and swelling.  She is already on an anti-inflammatory   MRI was done to evaluate LEFT knee:  1.  Tricompartmental degenerative changes, most significant medially. 2.  Intact ligamentous structures and no acute bony findings. 3.  Medial and lateral meniscal tears. 4.  Moderate to large joint effusion and moderate sized, partially ruptured, Baker's cyst. 5.  Pes anserinus bursitis.  She complains that her knee doesn't feel good.    Allergies (verified): 1)  ! Sulfa 2)  ! * Actifed  Past History:  Past Surgical History: Total Abdominal Hysterectomy  Review of Systems General:  Denies weight loss, weight gain, fever, chills, and fatigue. Cardiac :  Denies chest pain, angina, heart attack, heart failure, poor circulation, blood clots, and phlebitis. Resp:  Denies short of breath, difficulty breathing, COPD, cough, and pneumonia. GI:  Complains of reflux; denies nausea, vomiting, diarrhea, constipation, difficulty swallowing, ulcers, and GERD. GU:  Denies kidney failure, kidney transplant, kidney stones, burning, poor stream, testicular cancer, blood in urine, and . Neuro:  Denies headache, dizziness, migraines, numbness, weakness, tremor, and unsteady walking. MS:  Denies joint pain, rheumatoid arthritis, joint swelling, gout, bone cancer,  osteoporosis, and . Endo:  Denies thyroid disease, goiter, and diabetes. Psych:  Denies depression, mood swings, anxiety, panic attack, bipolar, and schizophrenia. Derm:  Denies eczema, cancer, and itching. EENT:  Denies poor vision, cataracts, glaucoma, poor hearing, vertigo, ears ringing, sinusitis, hoarseness, toothaches, and bleeding gums. Immunology:  Complains of seasonal allergies and allergic to bee stings; denies sinus problems. Lymphatic:  Denies lymph node cancer and lymph edema.  Physical Exam  Additional Exam:   * VS reviewed and were normal   *GEN: appearance was normal   ** CDV: normal pulses temperature and no edema  * LYMPH nodes were normal   * SKIN was normal   * Neuro: normal sensation ** Psyche: AAO x 3 and mood was normal   MSK *Gait was normal  *Inspection LEFT knee tenderness over the medial compartment. *ROM knee flexion 125 mild flexion contracture *Motor 5/5 *Stability normal special tests include McMurray's sign which is positive  RIGHT knee mild tenderness medial joint line full range of motion except for mild flexion contracture motor exam normal knee stable   The upper extremities have normal appearance, ROM, strength and stability.     Impression & Recommendations:  Problem # 1:  KNEE, ARTHRITIS, DEGEN./OSTEO (ICD-715.96)  Her updated medication list for this problem includes:    Lorcet Plus 7.5-650 Mg Tabs (Hydrocodone-acetaminophen) .Marland Kitchen... 1 by mouth q 4 as needed pain    Diclofenac Potassium 50 Mg Tabs (Diclofenac potassium) .Marland Kitchen... 1 by mouth two times a day  Orders: Est. Patient Level IV ([address])  Problem # 2:  DERANGEMENT OF POSTERIOR HORN OF MEDIAL MENISCUS (ICD-717.2)  Orders: Est. Patient Level IV (21308)  Problem # 3:  DERANGEMENT OF ANTERIOR HORN OF LATERAL MENISCUS (ICD-717.42)  recommend: SALK medial and lateral  menisectomy  As the patient has undergone this procedure before we just reviewed again were going to do in  the risk and complications she agrees to have the surgery, although we expect some mild discomfort afterwards we should be able to help her with some of her mechanical symptoms and improve her ability to walk.

## 2010-08-05 NOTE — Letter (Signed)
Summary: [employer] disab form  [employer] disab form   Imported By: Cammie Sickle 03/18/2010 17:59:34  _____________________________________________________________________  External Attachment:    Type:   Image     Comment:   External Document

## 2010-08-05 NOTE — Progress Notes (Signed)
Summary: Medical records DOS 07/24/09 and work note fax  Phone Note Outgoing Call   Call placed to: Insurer Summary of Call: Faxed office notes of 07/24/09 to [employer] short term disability today per patient request - Fax [phone], Ph [phone]. Faxed work note to employer, [employer], [facility] Att[employer] HR Dept - Fax [phone] per patient request.  Auth's on file. Initial call taken by: Cammie Sickle,  July 25, 2009 12:07 PM

## 2010-08-05 NOTE — Assessment & Plan Note (Signed)
Summary: 3 M RE-CK RT KNEE/SURG 06/18/09/[facility]/CAF   Visit Type:  Follow-up  CC:  3 month recheck rt knee post op.  History of Present Illness: a 60 year old female had arthroscopy of the RIGHT knee on June 18, 2009  Procedure  arthroscopy, partial medial menisectomy, partial lateral menisectomy, chondroplasty, medial femoral condyle, chondroplasty lateral femoral condyle, and extensive synovectomy.   Medication  Ibuprofen 800 as needed, Lorcet plus made her hallucinate.  she has some pain and swelling that come and go she takes ibuprofen on an as-needed basis.  She does have some reflux.  She does take occasional Prilosec.  She is ALLERGIC to sulfa.  She has no catching locking or giving way but pain and swelling seem to be the main findings  She does have some tenderness in the lateral compartment which is expected based on the arthritis we saw on the lateral side.  She has good range of motion.  No joint effusion.  Knee stable.  Recommend increase in the ibuprofen after she's taken some Prilosec and then continue Prilosec.  Follow up in a month to see if this does help.                  Allergies: 1)  ! Sulfa 2)  ! * Actifed 3)  ! Hydrocodone   Impression & Recommendations:  Problem # 1:  AFTERCARE FOLLOW SURGERY MUSCULOSKEL SYSTEM NEC (ICD-V58.78)  Orders: Post-Op Check (88416)  Problem # 2:  KNEE, ARTHRITIS, DEGEN./OSTEO (ICD-715.96)  Her updated medication list for this problem includes:    Lorcet Plus 7.5-650 Mg Tabs (Hydrocodone-acetaminophen) .Marland Kitchen... 1 by mouth q 4 as needed pain  Orders: Post-Op Check (60630)  Problem # 3:  UNSPECIFIED DERANGEMENT OF LATERAL MENISCUS (ICD-717.40)  Orders: Post-Op Check (16010)  Problem # 4:  DERANGEMENT OF POSTERIOR HORN OF MEDIAL MENISCUS (ICD-717.2)  Orders: Post-Op Check (93235)  Patient Instructions: 1)  Take prilosec for 5 days then start ibuprofen and continue prilosec x 1 month  2)  Please  schedule a follow-up appointment in 1 month.

## 2010-08-05 NOTE — Assessment & Plan Note (Signed)
Summary: POST OP 2/2 WK RE-CK RT KNEE/ARTH SURG 06/18/09/UMR/CAF   Visit Type:  Follow-up  CC:  post op .  History of Present Illness: I saw April Walker in the office today for a followup visit.  She is a 60 years old woman with the complaint of:  continued pain and swelling in the RIGHT knee.  DOS 06/18/09 right knee.  Procedure   arthroscopy, partial medial menisectomy, partial lateral menisectomy, chondroplasty, medial femoral condyle, chondroplasty lateral femoral condyle, and extensive synovectomy.  Medication:  Lorcet plus and Ibuprofen.  She has good and bad days. pain seems to be the problem / inferior patella and lateral side of the knee  Swelling: yes using cryotherapy.    calf pain is better     Physical examination of her RIGHT knee. She does have a joint effusion. Her flexion is limited to about 100 she does not come to full extension appears to have a mild 5 loss of extension.  Her knee remained stable.  Aspiration of 50 cc of clear, yellow fluid was performed under sterile conditions.  Assessment postop swelling and giving rehabilitation recommend aspiration and injection and then followed with physical therapy starting after today.  She'll stay out of work another 3 weeks , see me in 2 weeks.      Allergies: 1)  ! Sulfa 2)  ! * Actifed   Impression & Recommendations:  Problem # 1:  JOINT EFFUSION, KNEE (ICD-719.06)  aspiration RIGHT knee with injection, aspiration of 50 cc  Verbal consent was obtained. The knee was prepped with alcohol and ethyl chloride. 1 cc of depomedrol 40mg /cc and 4 cc of lidocaine 1% was injected. there were no complications.  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 1 by mouth q 4 as needed pain  Orders: Post-Op Check (81191)  Problem # 3:   UNSPECIFIED DERANGEMENT OF LATERAL MENISCUS (ICD-717.40)  Orders: Post-Op Check (47829)  Problem # 4:  DERANGEMENT OF POSTERIOR HORN OF MEDIAL MENISCUS (ICD-717.2)  Orders: Post-Op Check (56213) Joint Aspirate / Injection, Large (20610) Depo- Medrol 40mg  (J1030)  Patient Instructions: 1)  OOW 3 WEEKS  2)  F/U IN 2 WEEKS  3)  NO THERAPY TODAY  4)  You have received an injection of cortisone today. You may experience increased pain at the injection site. Apply ice pack to the area for 20 minutes every 2 hours and take 2 xtra strength tylenol every 8 hours. This increased pain will usually resolve in 24 hours. The injection will take effect in 3-10 days.

## 2010-08-05 NOTE — Letter (Signed)
Summary: [employer] form  [employer] form   Imported By: Cammie Sickle 07/23/2009 10:39:15  _____________________________________________________________________  External Attachment:    Type:   Image     Comment:   External Document

## 2010-08-07 NOTE — Assessment & Plan Note (Signed)
Summary: 3 M RE-CK LT KNEE/POST OP SURG 02/25/10/[facility]/CAF   Visit Type:  Follow-up Referring Provider:  self Primary Provider:  Dr. Nila Nephew  CC:  left knee.  History of Present Illness: s/p bilateral knee scopes the left was done in August of 2011   DOS 02/25/10.  Diclofenac bid Norco 5 only as needed.  Today, recheck.  Complaints: She has good days and bad days. She has pain on the medial aspect of her LEFT knee.  review of systems the RIGHT knee is doing well, without any major problems at this time.  Examination reveals tenderness on the medial joint line over the medial femoral condyle. She has 118 of knee flexion, and a slight flexion contracture of less than 5. He remains stable. She has excellent vascular function to the LEFT lower extremity.       Allergies: 1)  ! Sulfa 2)  ! * Actifed 3)  ! Hydrocodone   Impression & Recommendations:  Problem # 1:  KNEE, ARTHRITIS, DEGEN./OSTEO (ICD-715.96)  recommend hinged knee brace.  Recheck in 3 months.   The following medications were removed from the medication list:    Lorcet Plus 7.5-650 Mg Tabs (Hydrocodone-acetaminophen) .Marland Kitchen... 1 by mouth q 4 as needed pain Her updated medication list for this problem includes:    Diclofenac Potassium 50 Mg Tabs (Diclofenac potassium) .Marland Kitchen... 1 by mouth two times a day  Orders: Est. Patient Level III (56213)  Problem # 2:  ARTHROSCOPY, KNEE, HX OF (ICD-V45.89)  Orders: Est. Patient Level III (08657)   Orders Added: 1)  Est. Patient Level III [84696]

## 2010-08-08 NOTE — Miscellaneous (Signed)
Summary: PT progress note  PT progress note   Imported By: Jacklynn Ganong 07/26/2009 09:13:10  _____________________________________________________________________  External Attachment:    Type:   Image     Comment:   External Document

## 2010-08-15 ENCOUNTER — Other Ambulatory Visit (HOSPITAL_COMMUNITY): Payer: Self-pay | Admitting: Obstetrics & Gynecology

## 2010-08-15 DIAGNOSIS — Z139 Encounter for screening, unspecified: Secondary | ICD-10-CM

## 2010-09-02 ENCOUNTER — Ambulatory Visit (HOSPITAL_COMMUNITY)
Admission: RE | Admit: 2010-09-02 | Discharge: 2010-09-02 | Disposition: A | Discharge status: Home / Self Care | Payer: 59 | Source: Ambulatory Visit | Attending: Obstetrics & Gynecology | Admitting: Obstetrics & Gynecology

## 2010-09-02 DIAGNOSIS — Z139 Encounter for screening, unspecified: Secondary | ICD-10-CM

## 2010-09-02 DIAGNOSIS — Z1231 Encounter for screening mammogram for malignant neoplasm of breast: Secondary | ICD-10-CM | POA: Insufficient documentation

## 2010-09-16 ENCOUNTER — Encounter: Payer: Self-pay | Admitting: Orthopedic Surgery

## 2010-09-19 LAB — BASIC METABOLIC PANEL
CO2: 27 mEq/L (ref 19–32)
Calcium: 9.5 mg/dL (ref 8.4–10.5)
GFR calc Af Amer: >60 mL/min (ref >60)
GFR calc non Af Amer: >60 mL/min (ref >60)
Potassium: 4.2 mEq/L (ref 3.5–5.1)
Sodium: 143 mEq/L (ref 135–145)

## 2010-09-19 LAB — SURGICAL PCR SCREEN: Staphylococcus aureus: NEGATIVE

## 2010-09-19 LAB — HEMOGLOBIN AND HEMATOCRIT, BLOOD: HCT: 39.4 % (ref 36.0–46.0)

## 2010-10-07 LAB — BASIC METABOLIC PANEL
BUN: 13 mg/dL (ref 6–23)
Calcium: 9.3 mg/dL (ref 8.4–10.5)
GFR calc non Af Amer: >60 mL/min (ref >60)
Glucose, Bld: 85 mg/dL (ref 70–99)

## 2010-10-07 LAB — HEMOGLOBIN AND HEMATOCRIT, BLOOD
HCT: 38.9 % (ref 36.0–46.0)
Hemoglobin: 13.1 g/dL (ref 12.0–15.0)

## 2010-10-15 ENCOUNTER — Ambulatory Visit (INDEPENDENT_AMBULATORY_CARE_PROVIDER_SITE_OTHER): Payer: 59 | Admitting: Orthopedic Surgery

## 2010-10-15 ENCOUNTER — Encounter: Payer: Self-pay | Admitting: Orthopedic Surgery

## 2010-10-15 DIAGNOSIS — IMO0002 Reserved for concepts with insufficient information to code with codable children: Secondary | ICD-10-CM

## 2010-10-15 DIAGNOSIS — M23329 Other meniscus derangements, posterior horn of medial meniscus, unspecified knee: Secondary | ICD-10-CM

## 2010-10-15 DIAGNOSIS — M23302 Other meniscus derangements, unspecified lateral meniscus, unspecified knee: Secondary | ICD-10-CM

## 2010-10-15 DIAGNOSIS — M1710 Unilateral primary osteoarthritis, unspecified knee: Secondary | ICD-10-CM

## 2010-10-15 NOTE — Progress Notes (Signed)
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  The RIGHT knee is doing well although last week she noticed some pain running down the lateral portion of the leg towards the ankle starting at the lateral aspect of her knee.  She is on diclofenac 50 mg twice a day.  She's made adjustments in her activities and her work activities sitting down more and adjusting her exercise program which seemed to have helped her significantly.  Exam shows that the LEFT and RIGHT knee are normal in terms of temperature there is no joint effusion.  She has 110 of flexion on the LEFT and 100 on the RIGHT.  The knees are stable.  On the RIGHT knee she has tenderness around the fibular head and palpable tenderness in the lateral compartment.  I suspect peroneal nerve irritation.  Impression stable bilateral knees status post arthroscopies  Continue current treatment and followup 3 months

## 2010-11-11 ENCOUNTER — Other Ambulatory Visit: Payer: Self-pay | Admitting: *Deleted

## 2010-11-11 DIAGNOSIS — M1990 Unspecified osteoarthritis, unspecified site: Secondary | ICD-10-CM

## 2010-11-11 MED ORDER — DICLOFENAC SODIUM 50 MG PO TBEC: 50.0000 mg | DELAYED_RELEASE_TABLET | Freq: Two times a day (BID) | ORAL | Status: DC

## 2010-11-21 NOTE — Procedures (Signed)
[place]. [facility]  Patient:    April Walker, April Walker.                     MRN: [id number] Proc. Date: 01/25/01 Attending:  Anselmo Rod, M.D.                           Procedure Report  DATE OF BIRTH:  December 28, 1950  REFERRING PHYSICIAN:  PROCEDURE PERFORMED:  Colonoscopy.  ENDOSCOPIST:  Anselmo Rod, M.D.  INSTRUMENT USED:  Olympus video colonoscope.  INDICATIONS FOR PROCEDURE:  A 60 year old white female with a history of rectal bleeding.  Rule out colonic polyps, masses, hemorrhoids, etc.  PREPROCEDURE PREPARATION:  Informed consent was procured from the patient. The patient was fasted for eight hours prior to the procedure and prepped with a bottle of magnesium citrate and a gallon of NuLytely the night prior to the procedure.  PREPROCEDURE PHYSICAL:  The patient had stable vital signs.  Neck supple. Chest clear to auscultation.  S1, S2 regular.  Abdomen soft with normal abdominal bowel sounds.  DESCRIPTION OF PROCEDURE:  The patient was placed in the left lateral decubitus position and sedated with an additional 20 mg of Demerol and 2 mg of Versed intravenously.  Once the patient was adequately sedated and maintained on low-flow oxygen and continuous cardiac monitoring, the Olympus video colonoscope was advanced from the rectum to the cecum and terminal ileum without difficulty.  The entire colonic mucosa appeared healthy and without lesions.  No masses, polyps, erosions, ulcerations or diverticula were seen. The patient had small internal and external hemorrhoids and tolerated the procedure well without complication.  IMPRESSION:  Healthy-appearing colon up to the terminal ileum except for small nonbleeding internal and external hemorrhoids.  RECOMMENDATIONS: 1. A high fiber diet has been discussed with the patient in great detail. 2. Outpatient follow-up is advised as need arises.  If the patient has no    further problems, repeat  colorectal cancer screening is recommended in    the next five to 10 years. DD:  01/25/01 TD:  01/26/01 Job: [id number]

## 2010-11-21 NOTE — Procedures (Signed)
[place]. [facility]  Patient:    April Walker, April Walker.                     MRN: [id number] Proc. Date: 01/25/01 Attending:  Anselmo Rod, M.D.                           Procedure Report  DATE OF BIRTH:  03-Jan-1951  REFERRING PHYSICIAN:  PROCEDURE PERFORMED:  Esophagogastroduodenoscopy.  ENDOSCOPIST:  Anselmo Rod, M.D.  INSTRUMENT USED:  Olympus video panendoscope.  INDICATIONS FOR PROCEDURE:  Dysphagia with a history of rectal bleeding in a 60 year old white female.  Rule out peptic strictures, esophagitis, etc.  PREPROCEDURE PREPARATION:  Informed consent was procured from the patient. The patient was fasted for eight hours prior to the procedure and was advised to remain off of all nonsteroidals prior to the procedure.  PREPROCEDURE PHYSICAL:  The patient had stable vital signs.  Neck supple. Chest clear to auscultation.  S1, S2 regular.  Abdomen soft with normal abdominal bowel sounds.  DESCRIPTION OF PROCEDURE:  The patient was placed in left lateral decubitus position and sedated with 60 mg of Demerol and 6 mg of Versed intravenously. Once the patient was adequately sedated and maintained on low-flow oxygen and continuous cardiac monitoring, the Olympus video panendoscope was advanced through the mouthpiece, over the tongue, into the esophagus under direct vision.  The entire esophagus was widely patent and no strictures, masses, erosions, ulcerations or abnormalities were seen.  There was no evidence of achalasia.  On advancing the scope into the stomach, a small hiatal hernia was seen on high retroflexion.  The rest of the gastric mucosa and the proximal small bowel appeared normal.  IMPRESSION: 1. Widely patent esophagus. 2. Small hiatal hernia. 3. Normal-appearing stomach and proximal small bowel.  RECOMMENDATION: 1. Proceed with colonoscopy. 2. Outpatient follow-up ____________ dysphagia if her symptoms persist. 3. Further  recommendations made once the colonoscopy had been done. DD:  01/25/01 TD:  01/26/01 Job: [id number]

## 2010-11-25 ENCOUNTER — Telehealth: Payer: Self-pay

## 2010-11-25 ENCOUNTER — Ambulatory Visit (AMBULATORY_SURGERY_CENTER): Payer: 59

## 2010-11-25 VITALS — Ht 68.0 in | Wt 210.7 lb

## 2010-11-25 DIAGNOSIS — Z1211 Encounter for screening for malignant neoplasm of colon: Secondary | ICD-10-CM

## 2010-11-25 MED ORDER — PEG-KCL-NACL-NASULF-NA ASC-C 100 G PO SOLR: 1.0000 | Freq: Once | ORAL | Status: AC

## 2010-11-25 NOTE — Progress Notes (Signed)
Pt came into office today for her pre-visit prior to colon on 12/09/10. Pt had a colon/endo with Dr Loreta Ave in "2000". Medical release form filled out and gave to [facility].April Walker

## 2010-11-25 NOTE — Telephone Encounter (Signed)
Pt came into office today for pre-visit prior to colon on 12/09/10 with Dr Leone Payor. Pt had previous colon/endo with Dr Loreta Ave in "2000". Medical release form filled out and put on [employer].Ulis Rias

## 2010-11-26 ENCOUNTER — Encounter: Payer: Self-pay | Admitting: Internal Medicine

## 2010-11-27 NOTE — Telephone Encounter (Signed)
April Walker has release and will follow up with this.

## 2010-12-09 ENCOUNTER — Encounter: Payer: Self-pay | Admitting: Internal Medicine

## 2010-12-09 ENCOUNTER — Ambulatory Visit (AMBULATORY_SURGERY_CENTER): Payer: 59 | Admitting: Internal Medicine

## 2010-12-09 VITALS — BP 133/77 | HR 77 | Temp 98.4°F | Resp 18 | Ht 68.0 in | Wt 210.0 lb

## 2010-12-09 DIAGNOSIS — Z860100 Personal history of colon polyps, unspecified: Secondary | ICD-10-CM

## 2010-12-09 DIAGNOSIS — K5730 Diverticulosis of large intestine without perforation or abscess without bleeding: Secondary | ICD-10-CM

## 2010-12-09 DIAGNOSIS — D126 Benign neoplasm of colon, unspecified: Secondary | ICD-10-CM

## 2010-12-09 DIAGNOSIS — K648 Other hemorrhoids: Secondary | ICD-10-CM

## 2010-12-09 DIAGNOSIS — Z1211 Encounter for screening for malignant neoplasm of colon: Secondary | ICD-10-CM

## 2010-12-09 DIAGNOSIS — M1990 Unspecified osteoarthritis, unspecified site: Secondary | ICD-10-CM

## 2010-12-09 HISTORY — DX: Personal history of colonic polyps: Z86.010

## 2010-12-09 HISTORY — PX: COLONOSCOPY W/ POLYPECTOMY: SHX1380

## 2010-12-09 HISTORY — DX: Personal history of colon polyps, unspecified: Z86.0100

## 2010-12-09 MED ORDER — SODIUM CHLORIDE 0.9 % IV SOLN: 500.0000 mL | INTRAVENOUS | Status: DC

## 2010-12-09 MED ORDER — DICLOFENAC SODIUM 50 MG PO TBEC: 50.0000 mg | DELAYED_RELEASE_TABLET | Freq: Two times a day (BID) | ORAL | Status: DC

## 2010-12-09 NOTE — Patient Instructions (Addendum)
One polyp was removed today. Hold the diclofenac and restart on 6/14. Please review discharge instructions

## 2010-12-10 ENCOUNTER — Telehealth: Payer: Self-pay

## 2010-12-10 NOTE — Telephone Encounter (Signed)
No ID on anwsering machine.

## 2010-12-16 NOTE — Progress Notes (Signed)
Quick Note:  1 cm adenoma Diverticulosis Internal hemorrhoids  Repeat colonoscopy 5 years ______

## 2010-12-23 ENCOUNTER — Other Ambulatory Visit: Payer: Self-pay | Admitting: Orthopedic Surgery

## 2011-01-14 ENCOUNTER — Ambulatory Visit: Payer: 59 | Admitting: Orthopedic Surgery

## 2011-01-22 ENCOUNTER — Ambulatory Visit (INDEPENDENT_AMBULATORY_CARE_PROVIDER_SITE_OTHER): Payer: 59 | Admitting: Orthopedic Surgery

## 2011-01-22 DIAGNOSIS — M1712 Unilateral primary osteoarthritis, left knee: Secondary | ICD-10-CM

## 2011-01-22 DIAGNOSIS — M1710 Unilateral primary osteoarthritis, unspecified knee: Secondary | ICD-10-CM

## 2011-01-22 DIAGNOSIS — M25569 Pain in unspecified knee: Secondary | ICD-10-CM

## 2011-01-22 NOTE — Patient Instructions (Signed)
You have received a steroid shot. 15% of patients experience increased pain at the injection site with in the next 24 hours. This is best treated with ice and tylenol extra strength 2 tabs every 8 hours. If you are still having pain please call the office.    

## 2011-01-23 ENCOUNTER — Encounter: Payer: Self-pay | Admitting: Orthopedic Surgery

## 2011-01-23 ENCOUNTER — Telehealth: Payer: Self-pay | Admitting: *Deleted

## 2011-01-23 DIAGNOSIS — M1712 Unilateral primary osteoarthritis, left knee: Secondary | ICD-10-CM | POA: Insufficient documentation

## 2011-01-23 DIAGNOSIS — M25569 Pain in unspecified knee: Secondary | ICD-10-CM | POA: Insufficient documentation

## 2011-01-23 MED ORDER — METHYLPREDNISOLONE ACETATE 40 MG/ML IJ SUSP: 40.0000 mg | Freq: Once | INTRAMUSCULAR | Status: DC

## 2011-01-23 NOTE — Progress Notes (Signed)
  follow up C/O left knee pain right knee is fine; Mild crepitance in the left knee   ROS no catching locking or giving way    GENERAL: normal development   CDV: pulses are normal   Skin: normal  Lymph: deferred  Psychiatric: awake, alert and oriented  Neuro: normal sensation  MSK  Left knee:  Pain and tenderness over the medial and lateral joint lines with mild crepitation    Right knee normal exam   OA left knee synovitis  oa right knee stable   Injection LEFT knee.  Consent was obtained.  Time out was taken   LEFT knee was injected with Depo-Medrol 40 mg plus lidocaine 1% 4 cc.  Knee was prepped with alcohol and anesthetized with ethyl chloride.  The injection was tolerated without complication.    6 mos xrays b/l knees

## 2011-01-23 NOTE — Telephone Encounter (Signed)
error 

## 2011-03-27 LAB — BASIC METABOLIC PANEL
BUN: 12
Calcium: 8.8
GFR calc non Af Amer: >60
Glucose, Bld: 100 — ABNORMAL HIGH

## 2011-03-27 LAB — CBC
Platelets: 242
RDW: 13.4

## 2011-04-10 LAB — CREATININE, SERUM
Creatinine, Ser: 0.75
GFR calc Af Amer: >60

## 2011-04-10 LAB — BUN: BUN: 12

## 2011-06-17 ENCOUNTER — Other Ambulatory Visit: Payer: Self-pay | Admitting: Orthopedic Surgery

## 2011-06-17 NOTE — Telephone Encounter (Signed)
Ok

## 2011-07-28 ENCOUNTER — Ambulatory Visit (INDEPENDENT_AMBULATORY_CARE_PROVIDER_SITE_OTHER): Payer: 59 | Admitting: Orthopedic Surgery

## 2011-07-28 VITALS — BP 122/80 | Ht 68.0 in | Wt 212.0 lb

## 2011-07-28 DIAGNOSIS — M1710 Unilateral primary osteoarthritis, unspecified knee: Secondary | ICD-10-CM

## 2011-07-28 NOTE — Patient Instructions (Addendum)
Continue diclofenacTotal Knee Replacement In total knee replacement, the damaged knee is replaced with an artificial knee joint (prosthesis). The purpose of this surgery is to reduce pain and improve your range of motion. Regaining a near normal range of motion of your knee in the first 3 to 6 weeks after surgery is critical. Generally, these artificial joints last a minimum of 10 years. By that time, about 1 in 10 patients will need another surgery to repair the loose prosthesis. LET YOUR CAREGIVER KNOW ABOUT:    Allergies to food or medicine.     Medicines taken, including vitamins, herbs, eyedrops, over-the-counter medicines, and creams.     Use of steroids (by mouth or creams).     Previous problems with anesthetics or numbing medicines.     History of bleeding problems or blood clots.     Previous surgery.     Other health problems, including diabetes and kidney problems.     Possibility of pregnancy, if this applies.  RISKS AND COMPLICATIONS    Knee pain.     Loss of range of motion of the knee (stiffness) or instability.     Loosening of the prosthesis.     Infection.  BEFORE THE PROCEDURE    If you smoke, quit.     You may need a replacement or addition of blood (transfusion) during this procedure. You may want to donate your own blood for storage several weeks before the procedure. This way, your own blood can be stored and given to you if needed. Talk to your surgeon about this option.     Do not eat or drink anything for as long as directed by your caregiver before the procedure.     You may bathe and brush your teeth before the procedure. Do not swallow the water when brushing your teeth.     Scrub the area to be operated on for 5 minutes in the morning before the procedure. Use special soap if you are directed to do so by your caregiver.     Take your regular medicines with a small sip of water unless otherwise instructed. Your caregiver will let you know if there  are medicines that need to be stopped and for how long.     You should be present 60 minutes before your procedure or as directed by your caregiver.  PROCEDURE   Before surgery, an intravenous (IV) access for giving fluids will be started. You will be given medicines and/or gas to make you sleep (anesthetic). You may be given medicines in your back with a needle to make you numb from the waist down. Your surgeon will take out any damaged cartilage and bone. He or she will then put in new metal, plastic, or ceramic joint surfaces to restore alignment and function to your knee. AFTER THE PROCEDURE   You will be taken to the recovery area where a nurse will watch and check your progress. You may have a long, narrow tube (catheter) in your bladder after surgery. The catheter helps you empty your bladder (pass your urine). You may have drainage tubes coming out from under the dressing. These tubes attach to a device that removes blood or fluids that gather after surgery. Once you are awake, stable, and taking fluids well, you will be returned to your room. You will receive physical therapy as prescribed by your caregiver. If you do not have help at home, you may need to go to an extended care facility for a  few weeks. If you are sent home with a continuous passive motion (CPM) machine, use it as instructed. Document Released: 09/28/2000 Document Revised: 03/04/2011 Document Reviewed: 04/24/2009 [facility] Patient Information 2012 [place], [place].

## 2011-07-29 ENCOUNTER — Encounter: Payer: Self-pay | Admitting: Orthopedic Surgery

## 2011-07-29 DIAGNOSIS — M1710 Unilateral primary osteoarthritis, unspecified knee: Secondary | ICD-10-CM | POA: Insufficient documentation

## 2011-07-29 NOTE — Progress Notes (Signed)
Patient ID: April Walker, female   DOB: 04-Apr-1951, 61 y.o.   MRN: [id number]  FOLLOW UP VISIT  Chief complaint: KNEE PAIN  HPI:(1) S/P LEFT KNEE SCOPE, OA TREATING WITH DICLOFENAC, does reasonably well with the left knee however at the end of the day she is limping. 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 Diclofenac seems to handle the pain most of the time. The pain can be a worse at times depending on her activity level  ROS:(1) the knee will occasionally catch and lock. Pain seems to be increasing symptoms seemed to be worsening     Physical Exam(6) GENERAL: normal development   CDV: pulses are normal   Skin: normal  Psychiatric: awake, alert and oriented  Neuro: normal sensation  MSK gait abnormality limping left lower extremity 1 range of motion apparently is worsening as well today remeasured range of motion of 108 2 strength is normal 3 tenderness of the medial compartment with varus malalignment 4 ligaments remain stable  Imaging: Repeat imaging today compared to previous film shows increased joint space narrowing and now the tibiofemoral articulation shows bone to bone change   Assessment: Worsening osteoarthritis left knee with worsening symptoms    Plan: The patient is thinking about knee replacement surgery.  X-ray report left knee  3 views left knee  There is increased joint space narrowing of the medial compartment Alignment is varus Marginal osteophytes are noted Patellofemoral joint and lateral compartment have mild disease  Impression worsening osteoarthritis especially involving the medial compartment

## 2011-08-18 ENCOUNTER — Other Ambulatory Visit (HOSPITAL_COMMUNITY): Payer: Self-pay | Admitting: Obstetrics & Gynecology

## 2011-08-18 DIAGNOSIS — Z139 Encounter for screening, unspecified: Secondary | ICD-10-CM

## 2011-09-04 ENCOUNTER — Ambulatory Visit (HOSPITAL_COMMUNITY)
Admission: RE | Admit: 2011-09-04 | Discharge: 2011-09-04 | Disposition: A | Discharge status: Home / Self Care | Payer: 59 | Source: Ambulatory Visit | Attending: Obstetrics & Gynecology | Admitting: Obstetrics & Gynecology

## 2011-09-04 DIAGNOSIS — Z139 Encounter for screening, unspecified: Secondary | ICD-10-CM

## 2011-09-04 DIAGNOSIS — Z1231 Encounter for screening mammogram for malignant neoplasm of breast: Secondary | ICD-10-CM | POA: Insufficient documentation

## 2011-09-28 ENCOUNTER — Encounter: Payer: Self-pay | Admitting: Orthopedic Surgery

## 2011-09-28 ENCOUNTER — Ambulatory Visit (INDEPENDENT_AMBULATORY_CARE_PROVIDER_SITE_OTHER): Payer: 59 | Admitting: Orthopedic Surgery

## 2011-09-28 VITALS — BP 140/70 | Ht 68.0 in | Wt 212.0 lb

## 2011-09-28 DIAGNOSIS — M79609 Pain in unspecified limb: Secondary | ICD-10-CM

## 2011-09-28 DIAGNOSIS — M79673 Pain in unspecified foot: Secondary | ICD-10-CM | POA: Insufficient documentation

## 2011-09-28 NOTE — Progress Notes (Signed)
Subjective:     Patient ID: April Walker, female   DOB: [birth year], 61 y.o.   MRN: [id number]  HPI Chief Complaint  Patient presents with  . Follow-up    recheck left knee, discuss surgery   The patient is actually complaining today atraumatic onset of moderate pain in the dorsum of the RIGHT foot radiating into the arch for the last 3 weeks causing her to have difficulty ambulating.  It's a dull ache, with associated numbness or tingling  She is ready to have surgery on her LEFT knee total knee replacement when a substitute can be provided for her at work.  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 Abnormal cholesterol test   . Hyperlipidemia   . Knee pain, left     Past Surgical History  Procedure Date  . Tonsillectomy and adenoidectomy   . Right knee arthroscopy 2011 Dr. Romeo Apple  . Treatment fistula anal   . Upper gastrointestinal endoscopy   . Hemorrhoid surgery   . Colonoscopy 2002    hemorrhoids  . Colonoscopy w/ polypectomy 12/09/2010    1 cm sigmoid polyp - , diverticulosis, hemorrhoids  . Knee surgery       Review of Systems  Musculoskeletal: Positive for joint swelling, arthralgias and gait problem.  Neurological: Negative for weakness and numbness.         Objective:   Physical Exam  Constitutional: She is oriented to person, place, and time. She appears well-developed and well-nourished.  Musculoskeletal:       RIGHT foot no swelling, tenderness is noted over the fourth metatarsal.  A normal arch.  Achilles Tendon nontender no swelling in the retrocalcaneal bursa.  No atrophy.  Ankle is stable.  Neurological: She is alert and oriented to person, place, and time. She has normal reflexes.  Skin: Skin is warm. Rash noted.       LEFT leg LEFT knee RIGHT leg  Psychiatric: She has a normal mood and affect. Her behavior is normal. Judgment and thought content normal.       Assessment:     X-ray  was obtained to rule out stress fracture x-ray was negative  Unexplained pain in the RIGHT foot cannot completely rule out stress fracture but there are no signs of bone formation on x-ray to suggest.    Plan:     [facility]  The patient will call us when she is ready to have knee replacement surgery.  The rash over the LEFT knee was discussed the patient says that she can get the area cleaned and cleared in time for surgery.

## 2011-09-28 NOTE — Patient Instructions (Signed)
[facility]   Call office with in 1-2 weeks to schedule surgery

## 2011-09-28 NOTE — Progress Notes (Signed)
Separately identifiable x-ray report AP and lateral with oblique film of the RIGHT foot  Findings Bone alignment is normal.  No evidence of fracture.  No evidence of callus.  Impression Normal x-ray RIGHT foot

## 2011-10-29 ENCOUNTER — Telehealth: Payer: Self-pay | Admitting: Orthopedic Surgery

## 2011-10-29 NOTE — Telephone Encounter (Signed)
Patient called, states ready to schedule her surgery; requests week of 11/23/11, if possible.  Please call at work (at [employer]) ph# [phone], if before 3:00pm today, or home ph# at  [phone].  She will need a work note entered and post op appointment scheduled as well.

## 2011-11-03 NOTE — Telephone Encounter (Signed)
Routed back to nurse; patient awaiting date for surgery.

## 2011-11-10 ENCOUNTER — Encounter (HOSPITAL_COMMUNITY): Payer: Self-pay

## 2011-11-10 ENCOUNTER — Other Ambulatory Visit: Payer: Self-pay | Admitting: *Deleted

## 2011-11-10 NOTE — Telephone Encounter (Signed)
Surgery scheduled for 5/20, preop 5/8 1:45  Patient aware

## 2011-11-11 ENCOUNTER — Encounter (HOSPITAL_COMMUNITY): Payer: Self-pay

## 2011-11-11 ENCOUNTER — Encounter (HOSPITAL_COMMUNITY)
Admission: RE | Admit: 2011-11-11 | Discharge: 2011-11-11 | Disposition: A | Discharge status: Home / Self Care | Payer: 59 | Source: Ambulatory Visit | Attending: Orthopedic Surgery | Admitting: Orthopedic Surgery

## 2011-11-11 HISTORY — DX: Unspecified osteoarthritis, unspecified site: M19.90

## 2011-11-11 HISTORY — DX: Gastro-esophageal reflux disease without esophagitis: K21.9

## 2011-11-11 LAB — CBC
Hemoglobin: 12 g/dL (ref 12.0–15.0)
MCH: 26.7 pg (ref 26.0–34.0)
MCHC: 32.1 g/dL (ref 30.0–36.0)
RDW: 13.8 % (ref 11.5–15.5)

## 2011-11-11 LAB — BASIC METABOLIC PANEL
BUN: 19 mg/dL (ref 6–23)
Creatinine, Ser: 0.74 mg/dL (ref 0.50–1.10)
GFR calc Af Amer: >90 mL/min (ref >90)
GFR calc non Af Amer: 89 mL/min — ABNORMAL LOW (ref >90)
Glucose, Bld: 95 mg/dL (ref 70–99)
Potassium: 4.1 mEq/L (ref 3.5–5.1)

## 2011-11-11 LAB — DIFFERENTIAL
Basophils Absolute: 0.1 10*3/uL (ref 0.0–0.1)
Basophils Relative: 1 % (ref 0–1)
Eosinophils Absolute: 0.1 10*3/uL (ref 0.0–0.7)
Monocytes Absolute: 0.5 10*3/uL (ref 0.1–1.0)
Monocytes Relative: 8 % (ref 3–12)
Neutrophils Relative %: 60 % (ref 43–77)

## 2011-11-11 LAB — SURGICAL PCR SCREEN: MRSA, PCR: NEGATIVE

## 2011-11-11 LAB — PROTIME-INR: Prothrombin Time: 13.9 seconds (ref 11.6–15.2)

## 2011-11-11 NOTE — Patient Instructions (Addendum)
[name]  11/11/2011   Your procedure is scheduled on:  11/23/2011  Report to [facility] at  900  AM.  Call this number if you have problems the morning of surgery: [phone]   Remember:   Do not eat food:After Midnight.  May have clear liquids:until Midnight .  Clear liquids include soda, tea, black coffee, apple or grape juice, broth.  Take these medicines the morning of surgery with A SIP OF WATER:  none   Do not wear jewelry, make-up or nail polish.  Do not wear lotions, powders, or perfumes. You may wear deodorant.  Do not shave 48 hours prior to surgery.  Do not bring valuables to the hospital.  Contacts, dentures or bridgework may not be worn into surgery.  Leave suitcase in the car. After surgery it may be brought to your room.  For patients admitted to the hospital, checkout time is 11:00 AM the day of discharge.   Patients discharged the day of surgery will not be allowed to drive home.  Name and phone number of your driver: family  Special Instructions: CHG Shower Use Special Wash: 1/2 bottle night before surgery and 1/2 bottle morning of surgery.   Please read over the following fact sheets that you were given: Pain Booklet, MRSA Information, Surgical Site Infection Prevention, Anesthesia Post-op Instructions and Care and Recovery After Surgery Arthroscopic Procedure, Knee An arthroscopic procedure can find what is wrong with your knee. PROCEDURE Arthroscopy is a surgical technique that allows your orthopedic surgeon to diagnose and treat your knee injury with accuracy. They will look into your knee through a small instrument. This is almost like a small (pencil sized) telescope. Because arthroscopy affects your knee less than open knee surgery, you can anticipate a more rapid recovery. Taking an active role by following your caregiver's instructions will help with rapid and complete recovery. Use crutches, rest, elevation, ice, and knee exercises as instructed. The  length of recovery depends on various factors including type of injury, age, physical condition, medical conditions, and your rehabilitation. Your knee is the joint between the large bones (femur and tibia) in your leg. Cartilage covers these bone ends which are smooth and slippery and allow your knee to bend and move smoothly. Two menisci, thick, semi-lunar shaped pads of cartilage which form a rim inside the joint, help absorb shock and stabilize your knee. Ligaments bind the bones together and support your knee joint. Muscles move the joint, help support your knee, and take stress off the joint itself. Because of this all programs and physical therapy to rehabilitate an injured or repaired knee require rebuilding and strengthening your muscles. AFTER THE PROCEDURE  After the procedure, you will be moved to a recovery area until most of the effects of the medication have worn off. Your caregiver will discuss the test results with you.   Only take over-the-counter or prescription medicines for pain, discomfort, or fever as directed by your caregiver.  SEEK MEDICAL CARE IF:   You have increased bleeding from your wounds.   You see redness, swelling, or have increasing pain in your wounds.   You have pus coming from your wound.   You have an oral temperature above 102 F (38.9 C).   You notice a bad smell coming from the wound or dressing.   You have severe pain with any motion of your knee.  SEEK IMMEDIATE MEDICAL CARE IF:   You develop a rash.   You have difficulty breathing.  You have any allergic problems.  Document Released: 06/19/2000 Document Revised: 06/11/2011 Document Reviewed: 01/11/2008 [facility] Patient Information 2012 [place], [place].PATIENT INSTRUCTIONS POST-ANESTHESIA  IMMEDIATELY FOLLOWING SURGERY:  Do not drive or operate machinery for the first twenty four hours after surgery.  Do not make any important decisions for twenty four hours after surgery or while taking  narcotic pain medications or sedatives.  If you develop intractable nausea and vomiting or a severe headache please notify your doctor immediately.  FOLLOW-UP:  Please make an appointment with your surgeon as instructed. You do not need to follow up with anesthesia unless specifically instructed to do so.  WOUND CARE INSTRUCTIONS (if applicable):  Keep a dry clean dressing on the anesthesia/puncture wound site if there is drainage.  Once the wound has quit draining you may leave it open to air.  Generally you should leave the bandage intact for twenty four hours unless there is drainage.  If the epidural site drains for more than 36-48 hours please call the anesthesia department.  QUESTIONS?:  Please feel free to call your physician or the hospital operator if you have any questions, and they will be happy to assist you.     [facility] Anesthesia Department [address] [place] [phone]

## 2011-11-12 ENCOUNTER — Telehealth: Payer: Self-pay | Admitting: Orthopedic Surgery

## 2011-11-12 ENCOUNTER — Encounter: Payer: Self-pay | Admitting: Orthopedic Surgery

## 2011-11-12 NOTE — Telephone Encounter (Signed)
Post op appointment scheduled per phone with patient, for 12/07/11, 3:00pm

## 2011-11-12 NOTE — Telephone Encounter (Addendum)
Contacted insurer, [employer] ph [phone] regarding surgery scheduled 11/23/11 at [facility], CPT code 09811, ICD9 715.96, 715.16.  Spoke with Raynelle Fanning who routed me to pre-authorization dept.  Left voice message for return call; also given alternate ph# [phone] to reach representative. Spoke with Ricki Rodriguez, who again routed me to same department.  Voice message with all pertinent information, for call to be returned as soon as possible.

## 2011-11-13 NOTE — Telephone Encounter (Signed)
Received call back from [facility] pre-authorization contact, Pilar Jarvis, and patient's surgery CPT [phone] has been pre-authorized for 2-day stay beginning 11/23/11, under REF #[id number].

## 2011-11-18 ENCOUNTER — Telehealth: Payer: Self-pay | Admitting: Orthopedic Surgery

## 2011-11-18 NOTE — Telephone Encounter (Signed)
Patient called to relay that she received a call from [employer] regarding the order for the CPM machine following her scheduled total knee surgery.  She wanted to mention, since [employer] had been trying to set up delivery, that she will be discharged to rehab facility, possibly [facility].   Do we need to do anything further at this point?  Patient is at work [phone].

## 2011-11-19 NOTE — H&P (Signed)
April Walker is an 61 y.o. female.   Chief Complaint: left knee pain  HPI: This is a 61 year old female with a history of left knee arthroscopy and presents represented back to the office complaining of continued pain and loss of function in the left knee. 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 She has done in a relatively well considering that her pain is increased her function is decreased she is having difficulty with walking throughout the day she is having some stiffness in her knee as well as some other functional deficit such as kneeling and squatting and getting out of a chair. She would like to proceed now with knee replacement surgery understanding the risks and benefits of continued nonoperative treatment versus operative treatment.    Past Medical History  Diagnosis Date  . Abnormal cholesterol test   . Hyperlipidemia   . Knee pain, left   . GERD (gastroesophageal reflux disease)   . Arthritis     Past Surgical History  Procedure Date  . Tonsillectomy and adenoidectomy   . Right knee arthroscopy 2011 Dr. Romeo Apple  . Treatment fistula anal   . Upper gastrointestinal endoscopy   . Hemorrhoid surgery   . Colonoscopy 2002    hemorrhoids  . Colonoscopy w/ polypectomy 12/09/2010    1 cm sigmoid polyp - , diverticulosis, hemorrhoids  . Knee surgery   . Knee surgery 2012    Left knee arthroscopy    Family History  Problem Relation Age of Onset  . Adopted: Yes  . Stroke Mother   . Heart attack Father    Social History:  reports that she has never smoked. She has never used smokeless tobacco. She reports that she drinks alcohol. She reports that she does not use illicit drugs.  Allergies:  Allergies  Allergen Reactions  . Sulfonamide Derivatives Anaphylaxis and Hives  . Hydrocodone Other (See Comments)    REACTION: hallucinations  . Triprolidine-Pse Other (See Comments)    Increased heartrate    No  prescriptions prior to admission    No results found for this or any previous visit (from the past 48 hour(s)). No results found.  Review of Systems  Musculoskeletal: Positive for joint pain.  All other systems reviewed and are negative.    There were no vitals taken for this visit. Physical Exam  Musculoskeletal:       Vital signs are stable as recorded  General appearance is normal  The patient is alert and oriented x3  The patient's mood and affect are normal  Gait assessment: She has a waddling gait with an alternating coronal plane bouncing type pelvic tilt The cardiovascular exam reveals normal pulses and temperature without edema swelling.  The lymphatic system is negative for palpable lymph nodes  The sensory exam is normal.  There are no pathologic reflexes.  Balance is normal.   Exam of the upper extremities: Alignment is normal there is no contracture subluxation or muscle atrophy skin is normal  Left knee exam there is essentially diffuse knee tenderness but more medial than lateral. The range of motion arc is 120. The ligaments are stable the muscle strength and tone is normal and the skin over the knee joint is intact.   The right knee is stable at this time. The joint is reduced. The muscle tone is normal. There is no joint effusion. Range of motion is 128     Assessment/Plan Osteoarthritis left knee  failed conservative management  Plan left total knee arthroplasty    [name] 11/19/2011, 1:49 PM

## 2011-11-19 NOTE — Telephone Encounter (Signed)
Will not know until time of surgery if patient will go home or nursing facility

## 2011-11-23 ENCOUNTER — Encounter (HOSPITAL_COMMUNITY): Payer: Self-pay | Admitting: Anesthesiology

## 2011-11-23 ENCOUNTER — Inpatient Hospital Stay (HOSPITAL_COMMUNITY): Payer: 59

## 2011-11-23 ENCOUNTER — Encounter (HOSPITAL_COMMUNITY): Payer: Self-pay | Admitting: *Deleted

## 2011-11-23 ENCOUNTER — Encounter (HOSPITAL_COMMUNITY)
Admission: RE | Disposition: A | Discharge status: Skilled Nursing Facility | Payer: Self-pay | Source: Ambulatory Visit | Attending: Orthopedic Surgery

## 2011-11-23 ENCOUNTER — Inpatient Hospital Stay (HOSPITAL_COMMUNITY)
Admission: RE | Admit: 2011-11-23 | Discharge: 2011-11-26 | DRG: 470 | Disposition: A | Discharge status: Skilled Nursing Facility | Payer: 59 | Source: Ambulatory Visit | Attending: Orthopedic Surgery | Admitting: Orthopedic Surgery

## 2011-11-23 ENCOUNTER — Inpatient Hospital Stay (HOSPITAL_COMMUNITY): Payer: 59 | Admitting: Anesthesiology

## 2011-11-23 DIAGNOSIS — Z860100 Personal history of colon polyps, unspecified: Secondary | ICD-10-CM

## 2011-11-23 DIAGNOSIS — E785 Hyperlipidemia, unspecified: Secondary | ICD-10-CM | POA: Diagnosis present

## 2011-11-23 DIAGNOSIS — M1710 Unilateral primary osteoarthritis, unspecified knee: Principal | ICD-10-CM | POA: Diagnosis present

## 2011-11-23 DIAGNOSIS — M129 Arthropathy, unspecified: Secondary | ICD-10-CM | POA: Diagnosis present

## 2011-11-23 DIAGNOSIS — K219 Gastro-esophageal reflux disease without esophagitis: Secondary | ICD-10-CM | POA: Diagnosis present

## 2011-11-23 HISTORY — PX: TOTAL KNEE ARTHROPLASTY: SHX125

## 2011-11-23 SURGERY — ARTHROPLASTY, KNEE, TOTAL
Anesthesia: Spinal | Site: Knee | Laterality: Left | Wound class: Clean

## 2011-11-23 MED ORDER — METHOCARBAMOL 100 MG/ML IJ SOLN
500.0000 mg | Freq: Once | INTRAVENOUS | Status: AC
  Administered 2011-11-23: 500 mg via INTRAVENOUS
  Filled 2011-11-23: qty 5

## 2011-11-23 MED ORDER — POLYETHYLENE GLYCOL 3350 17 G PO PACK
17.0000 g | PACK | Freq: Every day | ORAL | Status: DC | PRN
  Administered 2011-11-25: 17 g via ORAL
  Filled 2011-11-23 (×2): qty 1

## 2011-11-23 MED ORDER — ALUM & MAG HYDROXIDE-SIMETH 200-200-20 MG/5ML PO SUSP: 30.0000 mL | ORAL | Status: DC | PRN

## 2011-11-23 MED ORDER — MIDAZOLAM HCL 2 MG/2ML IJ SOLN
INTRAMUSCULAR | Status: AC
  Filled 2011-11-23: qty 2

## 2011-11-23 MED ORDER — FENTANYL CITRATE 0.05 MG/ML IJ SOLN
INTRAMUSCULAR | Status: AC
  Administered 2011-11-23: 50 ug via INTRAVENOUS
  Filled 2011-11-23: qty 2

## 2011-11-23 MED ORDER — SENNA 8.6 MG PO TABS
1.0000 | ORAL_TABLET | Freq: Two times a day (BID) | ORAL | Status: DC
  Administered 2011-11-23 – 2011-11-26 (×6): 8.6 mg via ORAL
  Filled 2011-11-23 (×6): qty 1

## 2011-11-23 MED ORDER — PREGABALIN 50 MG PO CAPS
50.0000 mg | ORAL_CAPSULE | Freq: Three times a day (TID) | ORAL | Status: DC
  Administered 2011-11-23: 50 mg via ORAL

## 2011-11-23 MED ORDER — MAGNESIUM CITRATE PO SOLN: 1.0000 | Freq: Once | ORAL | Status: AC | PRN

## 2011-11-23 MED ORDER — CEFAZOLIN SODIUM-DEXTROSE 2-3 GM-% IV SOLR
INTRAVENOUS | Status: AC
  Filled 2011-11-23: qty 50

## 2011-11-23 MED ORDER — SODIUM CHLORIDE 0.9 % IV SOLN
INTRAVENOUS | Status: DC
  Administered 2011-11-23: 1000 mL via INTRAVENOUS
  Administered 2011-11-23: 15:00:00 via INTRAVENOUS

## 2011-11-23 MED ORDER — BUPIVACAINE ON-Q PAIN PUMP (FOR ORDER SET NO CHG)
INJECTION | Status: DC
  Filled 2011-11-23: qty 1

## 2011-11-23 MED ORDER — ACETAMINOPHEN 10 MG/ML IV SOLN
1000.0000 mg | Freq: Four times a day (QID) | INTRAVENOUS | Status: AC
  Administered 2011-11-23 – 2011-11-24 (×3): 1000 mg via INTRAVENOUS
  Filled 2011-11-23 (×3): qty 100

## 2011-11-23 MED ORDER — ACETAMINOPHEN 10 MG/ML IV SOLN
1000.0000 mg | Freq: Once | INTRAVENOUS | Status: AC
  Administered 2011-11-23: 1000 mg via INTRAVENOUS

## 2011-11-23 MED ORDER — LORATADINE 10 MG PO TABS
10.0000 mg | ORAL_TABLET | Freq: Every day | ORAL | Status: DC | PRN
  Filled 2011-11-23: qty 1

## 2011-11-23 MED ORDER — BUPIVACAINE 0.25 % ON-Q PUMP SINGLE CATH 300ML
INJECTION | Status: DC | PRN
  Administered 2011-11-23: 270 mL

## 2011-11-23 MED ORDER — METOCLOPRAMIDE HCL 5 MG PO TABS
5.0000 mg | ORAL_TABLET | Freq: Three times a day (TID) | ORAL | Status: DC | PRN
  Filled 2011-11-23: qty 2

## 2011-11-23 MED ORDER — DIPHENHYDRAMINE HCL 50 MG/ML IJ SOLN
INTRAMUSCULAR | Status: AC
  Administered 2011-11-23: 25 mg via INTRAVENOUS
  Filled 2011-11-23: qty 1

## 2011-11-23 MED ORDER — OXYCODONE HCL 5 MG PO TABS
5.0000 mg | ORAL_TABLET | Freq: Once | ORAL | Status: AC
  Administered 2011-11-23: 5 mg via ORAL

## 2011-11-23 MED ORDER — OXYCODONE HCL 5 MG PO TABS
ORAL_TABLET | ORAL | Status: AC
  Administered 2011-11-23: 5 mg via ORAL
  Filled 2011-11-23: qty 1

## 2011-11-23 MED ORDER — BUPIVACAINE HCL (PF) 0.25 % IJ SOLN
INTRAMUSCULAR | Status: AC
  Filled 2011-11-23: qty 270

## 2011-11-23 MED ORDER — ONDANSETRON HCL 4 MG PO TABS
4.0000 mg | ORAL_TABLET | Freq: Four times a day (QID) | ORAL | Status: DC | PRN
  Filled 2011-11-23: qty 1

## 2011-11-23 MED ORDER — MIDAZOLAM HCL 5 MG/5ML IJ SOLN
INTRAMUSCULAR | Status: DC | PRN
  Administered 2011-11-23 (×2): 2 mg via INTRAVENOUS

## 2011-11-23 MED ORDER — EPHEDRINE SULFATE 50 MG/ML IJ SOLN
INTRAMUSCULAR | Status: DC | PRN
  Administered 2011-11-23: 10 mg via INTRAVENOUS

## 2011-11-23 MED ORDER — BISACODYL 10 MG RE SUPP: 10.0000 mg | Freq: Every day | RECTAL | Status: DC | PRN

## 2011-11-23 MED ORDER — FENTANYL CITRATE 0.05 MG/ML IJ SOLN
INTRAMUSCULAR | Status: AC
  Filled 2011-11-23: qty 2

## 2011-11-23 MED ORDER — SODIUM CHLORIDE 0.9 % IR SOLN
Status: DC | PRN
  Administered 2011-11-23: 1000 mL
  Administered 2011-11-23: 3000 mL

## 2011-11-23 MED ORDER — BUPIVACAINE HCL 0.75 % IJ SOLN
INTRAMUSCULAR | Status: DC | PRN
  Administered 2011-11-23: 15 mg via INTRATHECAL

## 2011-11-23 MED ORDER — PHENOL 1.4 % MT LIQD
1.0000 | OROMUCOSAL | Status: DC | PRN
  Filled 2011-11-23: qty 177

## 2011-11-23 MED ORDER — ACETAMINOPHEN 650 MG RE SUPP
650.0000 mg | Freq: Four times a day (QID) | RECTAL | Status: DC | PRN
  Filled 2011-11-23: qty 1

## 2011-11-23 MED ORDER — ONDANSETRON HCL 4 MG/2ML IJ SOLN
INTRAMUSCULAR | Status: AC
  Administered 2011-11-23: 4 mg via INTRAVENOUS
  Filled 2011-11-23: qty 2

## 2011-11-23 MED ORDER — ONDANSETRON HCL 4 MG/2ML IJ SOLN
4.0000 mg | Freq: Once | INTRAMUSCULAR | Status: AC
  Administered 2011-11-23: 4 mg via INTRAVENOUS

## 2011-11-23 MED ORDER — ACETAMINOPHEN 325 MG PO TABS: 650.0000 mg | ORAL_TABLET | Freq: Four times a day (QID) | ORAL | Status: DC | PRN

## 2011-11-23 MED ORDER — FENTANYL CITRATE 0.05 MG/ML IJ SOLN
INTRAMUSCULAR | Status: DC | PRN
  Administered 2011-11-23: 50 ug via INTRAVENOUS
  Administered 2011-11-23: 20 ug via INTRATHECAL
  Administered 2011-11-23: 30 ug via INTRAVENOUS

## 2011-11-23 MED ORDER — FENTANYL CITRATE 0.05 MG/ML IJ SOLN
25.0000 ug | INTRAMUSCULAR | Status: DC | PRN
  Administered 2011-11-23 (×2): 50 ug via INTRAVENOUS

## 2011-11-23 MED ORDER — HYDROMORPHONE HCL PF 1 MG/ML IJ SOLN
0.5000 mg | INTRAMUSCULAR | Status: DC | PRN
  Administered 2011-11-23 – 2011-11-24 (×5): 0.5 mg via INTRAVENOUS
  Filled 2011-11-23 (×5): qty 1

## 2011-11-23 MED ORDER — EPHEDRINE SULFATE 50 MG/ML IJ SOLN
INTRAMUSCULAR | Status: AC
  Filled 2011-11-23: qty 1

## 2011-11-23 MED ORDER — ACETAMINOPHEN 10 MG/ML IV SOLN
INTRAVENOUS | Status: AC
  Administered 2011-11-23: 1000 mg via INTRAVENOUS
  Filled 2011-11-23: qty 100

## 2011-11-23 MED ORDER — TEMAZEPAM 15 MG PO CAPS: 15.0000 mg | ORAL_CAPSULE | Freq: Every evening | ORAL | Status: DC | PRN

## 2011-11-23 MED ORDER — CEFAZOLIN SODIUM-DEXTROSE 2-3 GM-% IV SOLR: 2.0000 g | INTRAVENOUS | Status: DC

## 2011-11-23 MED ORDER — CHLORHEXIDINE GLUCONATE 4 % EX LIQD
60.0000 mL | Freq: Once | CUTANEOUS | Status: DC
  Filled 2011-11-23: qty 60

## 2011-11-23 MED ORDER — LACTATED RINGERS IV SOLN
INTRAVENOUS | Status: DC
  Administered 2011-11-23: 11:00:00 via INTRAVENOUS
  Administered 2011-11-23: 1000 mL via INTRAVENOUS

## 2011-11-23 MED ORDER — CEFAZOLIN SODIUM-DEXTROSE 2-3 GM-% IV SOLR
2.0000 g | Freq: Four times a day (QID) | INTRAVENOUS | Status: AC
  Administered 2011-11-23 – 2011-11-24 (×3): 2 g via INTRAVENOUS
  Filled 2011-11-23 (×3): qty 50

## 2011-11-23 MED ORDER — PROPOFOL 10 MG/ML IV EMUL
INTRAVENOUS | Status: AC
  Filled 2011-11-23: qty 20

## 2011-11-23 MED ORDER — ONDANSETRON HCL 4 MG/2ML IJ SOLN: 4.0000 mg | Freq: Once | INTRAMUSCULAR | Status: DC | PRN

## 2011-11-23 MED ORDER — PREGABALIN 50 MG PO CAPS
ORAL_CAPSULE | ORAL | Status: AC
  Administered 2011-11-23: 50 mg via ORAL
  Filled 2011-11-23: qty 1

## 2011-11-23 MED ORDER — ASPIRIN EC 325 MG PO TBEC
325.0000 mg | DELAYED_RELEASE_TABLET | Freq: Two times a day (BID) | ORAL | Status: DC
  Administered 2011-11-24 – 2011-11-26 (×5): 325 mg via ORAL
  Filled 2011-11-23 (×6): qty 1

## 2011-11-23 MED ORDER — METHOCARBAMOL 100 MG/ML IJ SOLN
500.0000 mg | Freq: Four times a day (QID) | INTRAVENOUS | Status: DC
  Administered 2011-11-23 – 2011-11-25 (×9): 500 mg via INTRAVENOUS
  Filled 2011-11-23 (×12): qty 5

## 2011-11-23 MED ORDER — BUPIVACAINE IN DEXTROSE 0.75-8.25 % IT SOLN
INTRATHECAL | Status: AC
  Filled 2011-11-23: qty 2

## 2011-11-23 MED ORDER — METOCLOPRAMIDE HCL 5 MG/ML IJ SOLN: 5.0000 mg | Freq: Three times a day (TID) | INTRAMUSCULAR | Status: DC | PRN

## 2011-11-23 MED ORDER — ONDANSETRON HCL 4 MG/2ML IJ SOLN: 4.0000 mg | Freq: Four times a day (QID) | INTRAMUSCULAR | Status: DC | PRN

## 2011-11-23 MED ORDER — DOCUSATE SODIUM 100 MG PO CAPS
100.0000 mg | ORAL_CAPSULE | Freq: Two times a day (BID) | ORAL | Status: DC
  Administered 2011-11-23 – 2011-11-26 (×6): 100 mg via ORAL
  Filled 2011-11-23 (×6): qty 1

## 2011-11-23 MED ORDER — PROPOFOL 10 MG/ML IV EMUL
INTRAVENOUS | Status: DC | PRN
  Administered 2011-11-23: 11:00:00 via INTRAVENOUS
  Administered 2011-11-23: 50 ug/kg/min via INTRAVENOUS

## 2011-11-23 MED ORDER — ATORVASTATIN CALCIUM 10 MG PO TABS
10.0000 mg | ORAL_TABLET | Freq: Every day | ORAL | Status: DC
  Administered 2011-11-23 – 2011-11-25 (×3): 10 mg via ORAL
  Filled 2011-11-23 (×4): qty 1

## 2011-11-23 MED ORDER — PROPOFOL 10 MG/ML IV BOLUS
INTRAVENOUS | Status: DC | PRN
  Administered 2011-11-23 (×2): 10 mg via INTRAVENOUS

## 2011-11-23 MED ORDER — BUPIVACAINE-EPINEPHRINE PF 0.5-1:200000 % IJ SOLN
INTRAMUSCULAR | Status: DC | PRN
  Administered 2011-11-23: 60 mL

## 2011-11-23 MED ORDER — CEFAZOLIN SODIUM 1-5 GM-% IV SOLN
INTRAVENOUS | Status: DC | PRN
  Administered 2011-11-23: 2 g via INTRAVENOUS

## 2011-11-23 MED ORDER — DIPHENHYDRAMINE HCL 50 MG/ML IJ SOLN
25.0000 mg | Freq: Once | INTRAMUSCULAR | Status: AC
  Administered 2011-11-23: 25 mg via INTRAVENOUS

## 2011-11-23 MED ORDER — BUPIVACAINE-EPINEPHRINE PF 0.5-1:200000 % IJ SOLN
INTRAMUSCULAR | Status: AC
  Filled 2011-11-23: qty 20

## 2011-11-23 MED ORDER — OXYCODONE HCL 5 MG PO TABS: 5.0000 mg | ORAL_TABLET | ORAL | Status: DC

## 2011-11-23 MED ORDER — MENTHOL 3 MG MT LOZG
1.0000 | LOZENGE | OROMUCOSAL | Status: DC | PRN
  Filled 2011-11-23: qty 9

## 2011-11-23 MED ORDER — MIDAZOLAM HCL 2 MG/2ML IJ SOLN
1.0000 mg | INTRAMUSCULAR | Status: DC | PRN
  Administered 2011-11-23: 2 mg via INTRAVENOUS

## 2011-11-23 SURGICAL SUPPLY — 70 items
BAG HAMPER (MISCELLANEOUS) ×2 IMPLANT
BANDAGE ELASTIC 4 VELCRO NS (GAUZE/BANDAGES/DRESSINGS) ×2 IMPLANT
BANDAGE ELASTIC 6 VELCRO NS (GAUZE/BANDAGES/DRESSINGS) ×4 IMPLANT
BANDAGE ESMARK 6X9 LF (GAUZE/BANDAGES/DRESSINGS) ×1 IMPLANT
BIT DRILL 3.2X128 (BIT) ×1 IMPLANT
BLADE HEX COATED 2.75 (ELECTRODE) ×2 IMPLANT
BLADE SAG 18X100X1.27 (BLADE) ×2 IMPLANT
BLADE SAGITTAL 25.0X1.27X90 (BLADE) ×2 IMPLANT
BLADE SAW SAG 90X13X1.27 (BLADE) ×2 IMPLANT
BNDG CMPR 9X6 STRL LF SNTH (GAUZE/BANDAGES/DRESSINGS) ×1
BNDG ESMARK 6X9 LF (GAUZE/BANDAGES/DRESSINGS) ×2
BOWL SMART MIX CTS (DISPOSABLE) IMPLANT
CATH KIT ON Q 2.5IN SLV (PAIN MANAGEMENT) ×2 IMPLANT
CEMENT HV SMART SET (Cement) ×4 IMPLANT
CLOTH BEACON ORANGE TIMEOUT ST (SAFETY) ×2 IMPLANT
COVER LIGHT HANDLE STERIS (MISCELLANEOUS) ×4 IMPLANT
COVER PROBE W GEL 5X96 (DRAPES) ×2 IMPLANT
CUFF TOURNIQUET SINGLE 34IN LL (TOURNIQUET CUFF) IMPLANT
CUFF TOURNIQUET SINGLE 44IN (TOURNIQUET CUFF) ×1 IMPLANT
DECANTER SPIKE VIAL GLASS SM (MISCELLANEOUS) ×22 IMPLANT
DRAIN TROCAR  MED 1/8 (DRAIN) ×2 IMPLANT
DRAPE BACK TABLE (DRAPES) ×2 IMPLANT
DRAPE EXTREMITY T 121X128X90 (DRAPE) ×2 IMPLANT
DRAPE U-SHAPE 47X51 STRL (DRAPES) ×1 IMPLANT
DRSG MEPILEX BORDER 4X12 (GAUZE/BANDAGES/DRESSINGS) ×2 IMPLANT
DURAPREP 26ML APPLICATOR (WOUND CARE) ×4 IMPLANT
ELECT REM PT RETURN 9FT ADLT (ELECTROSURGICAL) ×2
ELECTRODE REM PT RTRN 9FT ADLT (ELECTROSURGICAL) ×1 IMPLANT
FACESHIELD LNG OPTICON STERILE (SAFETY) ×2 IMPLANT
GLOVE BIO SURGEON STRL SZ7.5 (GLOVE) ×1 IMPLANT
GLOVE BIOGEL PI IND STRL 7.0 (GLOVE) IMPLANT
GLOVE BIOGEL PI INDICATOR 7.0 (GLOVE) ×2
GLOVE ECLIPSE 6.5 STRL STRAW (GLOVE) ×3 IMPLANT
GLOVE OPTIFIT SS 8.0 STRL (GLOVE) ×2 IMPLANT
GLOVE SKINSENSE NS SZ8.0 LF (GLOVE) ×2
GLOVE SKINSENSE STRL SZ8.0 LF (GLOVE) ×2 IMPLANT
GLOVE SS N UNI LF 8.5 STRL (GLOVE) ×2 IMPLANT
GOWN STRL REIN XL XLG (GOWN DISPOSABLE) ×9 IMPLANT
HANDPIECE INTERPULSE COAX TIP (DISPOSABLE) ×2
HOOD W/PEELAWAY (MISCELLANEOUS) ×9 IMPLANT
INST SET MAJOR BONE (KITS) ×2 IMPLANT
IV NS IRRIG 3000ML ARTHROMATIC (IV SOLUTION) ×2 IMPLANT
KIT BLADEGUARD II DBL (SET/KITS/TRAYS/PACK) ×2 IMPLANT
KIT ROOM TURNOVER APOR (KITS) ×2 IMPLANT
MANIFOLD NEPTUNE II (INSTRUMENTS) ×2 IMPLANT
MARKER SKIN DUAL TIP RULER LAB (MISCELLANEOUS) ×2 IMPLANT
NDL HYPO 21X1.5 SAFETY (NEEDLE) ×1 IMPLANT
NEEDLE HYPO 21X1.5 SAFETY (NEEDLE) ×2 IMPLANT
NS IRRIG 1000ML POUR BTL (IV SOLUTION) ×2 IMPLANT
PACK TOTAL JOINT (CUSTOM PROCEDURE TRAY) ×2 IMPLANT
PAD ARMBOARD 7.5X6 YLW CONV (MISCELLANEOUS) ×2 IMPLANT
PAD DANNIFLEX CPM (ORTHOPEDIC SUPPLIES) ×2 IMPLANT
PIN TROCAR 3 INCH (PIN) ×2 IMPLANT
PIN/DRILL PACK ORTHO 1/8X3.0 (PIN) ×1 IMPLANT
PRECISION FALCON OSCILLATING SAW ×1 IMPLANT
PUMP ON Q 270MLX5ML/HR (PAIN MANAGEMENT) ×2 IMPLANT
SET BASIN LINEN APH (SET/KITS/TRAYS/PACK) ×2 IMPLANT
SET HNDPC FAN SPRY TIP SCT (DISPOSABLE) ×1 IMPLANT
SPONGE GAUZE 4X4 12PLY (GAUZE/BANDAGES/DRESSINGS) IMPLANT
STAPLER VISISTAT 35W (STAPLE) ×2 IMPLANT
SUT BRALON NAB BRD #1 30IN (SUTURE) ×4 IMPLANT
SUT MON AB 0 CT1 (SUTURE) ×5 IMPLANT
SUT MON AB 2-0 CT1 36 (SUTURE) ×2 IMPLANT
SYR 30ML LL (SYRINGE) ×2 IMPLANT
SYR BULB IRRIGATION 50ML (SYRINGE) ×2 IMPLANT
TOWEL OR 17X26 4PK STRL BLUE (TOWEL DISPOSABLE) ×2 IMPLANT
TOWER CARTRIDGE SMART MIX (DISPOSABLE) ×2 IMPLANT
TRAY FOLEY CATH 14FR (SET/KITS/TRAYS/PACK) ×2 IMPLANT
WATER STERILE IRR 1000ML POUR (IV SOLUTION) ×8 IMPLANT
YANKAUER SUCT 12FT TUBE ARGYLE (SUCTIONS) ×2 IMPLANT

## 2011-11-23 NOTE — Progress Notes (Signed)
2 units PC available in the lab, verified by Cordelia Pen. Pt taught incentive spirometry, with return demonstration.

## 2011-11-23 NOTE — Anesthesia Postprocedure Evaluation (Signed)
  Anesthesia Post-op Note  Patient: April Walker  Procedure(s) Performed: Procedure(s) (LRB): TOTAL KNEE ARTHROPLASTY (Left)  Patient Location: PACU  Anesthesia Type: Spinal  Level of Consciousness: awake, alert  and oriented  Airway and Oxygen Therapy: Patient Spontanous Breathing and Patient connected to nasal cannula oxygen  Post-op Pain: none  Post-op Assessment: Post-op Vital signs reviewed, Patient's Cardiovascular Status Stable, Respiratory Function Stable, Patent Airway and No signs of Nausea or vomiting  Post-op Vital Signs: Reviewed and stable  Complications: No apparent anesthesia complications

## 2011-11-23 NOTE — Anesthesia Preprocedure Evaluation (Addendum)
Anesthesia Evaluation  Patient identified by MRN, date of birth, ID band Patient awake    Reviewed: Allergy & Precautions, H&P , NPO status , Patient's Chart, lab work & pertinent test results  History of Anesthesia Complications Negative for: history of anesthetic complications  Airway Mallampati: II      Dental  (+) Teeth Intact   Pulmonary neg pulmonary ROS,  breath sounds clear to auscultation        Cardiovascular negative cardio ROS  Rhythm:Regular Rate:Normal     Neuro/Psych    GI/Hepatic GERD-  Medicated and Controlled,  Endo/Other    Renal/GU      Musculoskeletal   Abdominal   Peds  Hematology   Anesthesia Other Findings   Reproductive/Obstetrics                           Anesthesia Physical Anesthesia Plan  ASA: II  Anesthesia Plan: Spinal   Post-op Pain Management:    Induction:   Airway Management Planned: Nasal Cannula  Additional Equipment:   Intra-op Plan:   Post-operative Plan:   Informed Consent: I have reviewed the patients History and Physical, chart, labs and discussed the procedure including the risks, benefits and alternatives for the proposed anesthesia with the patient or authorized representative who has indicated his/her understanding and acceptance.     Plan Discussed with:   Anesthesia Plan Comments:         Anesthesia Quick Evaluation

## 2011-11-23 NOTE — Brief Op Note (Signed)
11/23/2011  11:46 AM  PATIENT:  April Walker  61 y.o. female  PRE-OPERATIVE DIAGNOSIS:  left knee osteoarthritis  POST-OPERATIVE DIAGNOSIS:  left knee osteoarthritis  PROCEDURE:  Procedure(s) (LRB): TOTAL KNEE ARTHROPLASTY (Left)  depuy 38f, 4t  38P  10 poly ps sigma FB  FINDINGS: Operative findings severe wear of the medial compartment and the patellofemoral joint with fairly normal lateral compartment, minimal deformity ITO terms of varus valgus  SURGEON:  Surgeon(s) and Role:    * Vickki Hearing, MD - Primary  PHYSICIAN ASSISTANT:   ASSISTANTS: Agenda Nation and Wayne McFaTTER   ANESTHESIA:   spinal  EBL:  Total I/O In: 1000 [I.V.:1000] Out: 150 [Urine:150]  BLOOD ADMINISTERED:none  DRAINS: One Hemovac not charged and one subcutaneous On-Q pain pump and the subcutaneous with a Hemovac in the joint  LOCAL MEDICATIONS USED:  MARCAINE   , Amount: 60 ml and OTHER epinephrine  SPECIMEN:  No Specimen  DISPOSITION OF SPECIMEN:  N/A  COUNTS:  YES  TOURNIQUET:   Total Tourniquet Time Documented: Thigh (Left) - 80 minutes  DICTATION: .Reubin Milan Dictation  PLAN OF CARE: Admit to inpatient   PATIENT DISPOSITION:  PACU - hemodynamically stable.   Delay start of Pharmacological VTE agent (>24hrs) due to surgical blood loss or risk of bleeding: yes

## 2011-11-23 NOTE — Progress Notes (Signed)
UR Chart Review Completed  

## 2011-11-23 NOTE — Interval H&P Note (Signed)
History and Physical Interval Note:  11/23/2011 9:12 AM  April Walker  has presented today for surgery, with the diagnosis of left knee osteoarthritis  The various methods of treatment have been discussed with the patient and family. After consideration of risks, benefits and other options for treatment, the patient has consented to  Procedure(s) (LRB): TOTAL KNEE ARTHROPLASTY (Left) as a surgical intervention .  The patients' history has been reviewed, patient examined, no change in status, stable for surgery.  I have reviewed the patients' chart and labs.  Questions were answered to the patient's satisfaction.   Surgical incision will be altered slightly to avoid scratched skin  [name]

## 2011-11-23 NOTE — Transfer of Care (Signed)
Immediate Anesthesia Transfer of Care Note  Patient: April Walker  Procedure(s) Performed: Procedure(s) (LRB): TOTAL KNEE ARTHROPLASTY (Left)  Patient Location: PACU  Anesthesia Type: Regional and Spinal  Level of Consciousness: awake, alert  and oriented  Airway & Oxygen Therapy: Patient Spontanous Breathing and Patient connected to nasal cannula oxygen  Post-op Assessment: Report given to PACU RN  Post vital signs: Reviewed and stable  Complications: No apparent anesthesia complications

## 2011-11-23 NOTE — Anesthesia Procedure Notes (Addendum)
Spinal  Patient location during procedure: OR Start time: 11/23/2011 9:49 AM Staffing CRNA/Resident: Glynn Octave E Preanesthetic Checklist Completed: patient identified, site marked, surgical consent, pre-op evaluation, timeout performed, IV checked, risks and benefits discussed and monitors and equipment checked Spinal Block Patient position: left lateral decubitus Prep: Betadine Patient monitoring: heart rate, cardiac monitor, continuous pulse ox and blood pressure Approach: left paramedian Location: L3-4 Injection technique: single-shot Needle Needle type: Spinocan  Needle gauge: 22 G Needle length: 9 cm Assessment Sensory level: T10 Additional Notes  ATTEMPTS:1 TRAY WU:[id number] TRAY EXPIRATION DATE:05/2012

## 2011-11-23 NOTE — Op Note (Signed)
Preop diagnosis osteoarthritis LEFT  knee Postop diagnosis same Procedure LEFT total knee arthroplasty Surgeon Romeo Apple Assisted by [facility] AND BETTY ASHLEY  Anesthesia spinal Findings OA MEDIAL AND PTF JOINT SEVERE  Tourniquet time 80 minutes, pressure 300 mm of mercury  Indications for procedure disabling knee pain, failure to control pain with nonoperative measures  Details of procedure:  In the preop area the patient's LEFT knee was marked and countersigned by the surgeon, the chart was updated, consent was signed  The patient was taken to the operating room for spinal anesthetic followed by administering 2 g of Ancef based on weight of >80 kg  A Foley catheter was inserted sterilely, then the operative extremity LEFT was prepped and draped sterilely  The timeout was completed  The limb was then exsanguinated with a six-inch Esmarch with the knee in flexion and the tourniquet was elevated to 300 mm of mercury. A midline incision was made, the subcutaneous tissues were divided down to the extensor mechanism. A medial arthrotomy was performed, the patella was everted,  the fat pad was resected. The medial and  lateral menisci were resected. The medial soft tissue sleeve was elevated to the mid coronal plane. The anterior cruciate ligament and PCL were resected. Osteophytes were removed. The distal femur anterior surface was skeletonized with sharp dissection.  A three-eighths inch drill bit was used to enter the femoral canal which was suctioned and irrigated until the fluid was clear;  an intramedullary rod was placed in the femur with a 5 LEFT  setting, the block was pinned in place; then an 10mm distal femoral resection was performed. The cut was checked for flatness.  The sizing guide was then placed on the femur, the femur measured a size 5; the block was pinned in external rotation 3 using the epicondyles as reference; a  4-in-1 cutting block was placed and the 4 cuts  were made with retractors protecting the collateral ligaments. The posterior osteophytes were removed with a curved osteotome. Residual PCL tissue was resected; residual meniscal tissue was resected.  The external tibial alignment instrument was set for tibial resection. The guide was   placed referencing the medial side which was the worn side and the stylus was set at 2 mm resection. Anterior slope was built in to match the patients anatomy; a neutral varus valgus cut was set using the medial third of the tibial tubercle as reference along with the medial portion of the lateral tibial spine. Theguide was stabilized with pins.  A saw was used to resect the anterior tibia. The tibia was sized to a size 4 base plate.  We then placed spacer blocks using a  10 and a 12.5 spacer block;  the knee was balanced  in extension and was balanced in flexion with the 10 (THE 12.5 DID NOT FIT) spacer block   The box cut was then done using the box cutting guide. We then turned our attention to the patella.  The patella measured 25 mm we set the guide to leave 16 mm of patella.  After resection of the patella,  It was remeasured, and measured 16 mm. The size was 38/9. We then drilled the 3 peg holes.  We then did a trial reduction. The trial reduction was excellent with full extension, balanced in extension, balance in flexion. Passive flexion equalled 115, patella normal tracking.   We then punched the tibia per technique.   The bone was then irrigated and dried while cement was  mixed on the back table. The implants were checked for accuracy and then cemented in place; excess cement was removed; the cement was allowed to cure. The wound was then irrigated with copious amounts of saline, the posterior capsule was injected with 30 cc of Marcaine with epinephrine followed by 30 cc in the soft tissue. The 10PS, FB  insert was placed. Range of motion matched trial reduction  A HEMOVAC DRAINED WAS PLACED AND CHECKED THRU  CLOSURE   The capsule was closed with #1 Bralon in interrupted and running fashion and then the joint was injected with 30 cc of Marcaine with epinephrine.  The subcutaneous pain pump was placed.  The subcutaneous tissues were closed with  0 Monocryl  in running fashion  Staples were used to reapproximate the skin edges.  Sterile dressings were applied. A radiograph was obtained.   The patient was then taken to the recovery room in stable condition.  Routine postop plan for knee replacement.

## 2011-11-23 NOTE — Interval H&P Note (Signed)
History and Physical Interval Note:  11/23/2011 9:11 AM  April Walker  has presented today for surgery, with the diagnosis of left knee osteoarthritis  The various methods of treatment have been discussed with the patient and family. After consideration of risks, benefits and other options for treatment, the patient has consented to  Procedure(s) (LRB): TOTAL KNEE ARTHROPLASTY (Left) as a surgical intervention .  The patients' history has been reviewed, patient examined, no change in status, stable for surgery.  I have reviewed the patients' chart and labs.  Questions were answered to the patient's satisfaction.     [name]

## 2011-11-23 NOTE — Interval H&P Note (Signed)
History and Physical Interval Note:  11/23/2011 9:10 AM  April Walker  has presented today for surgery, with the diagnosis of left knee osteoarthritis  The various methods of treatment have been discussed with the patient and family. After consideration of risks, benefits and other options for treatment, the patient has consented to  Procedure(s) (LRB): TOTAL KNEE ARTHROPLASTY (Left) as a surgical intervention .  The patients' history has been reviewed, patient examined, no change in status, stable for surgery.  I have reviewed the patients' chart and labs.  Questions were answered to the patient's satisfaction.   Surgical incision will be altered to avoid skin abnormalities    [name]

## 2011-11-24 LAB — CBC
HCT: 31.6 % — ABNORMAL LOW (ref 36.0–46.0)
Hemoglobin: 10.5 g/dL — ABNORMAL LOW (ref 12.0–15.0)
WBC: 11.6 10*3/uL — ABNORMAL HIGH (ref 4.0–10.5)

## 2011-11-24 LAB — BASIC METABOLIC PANEL
BUN: 11 mg/dL (ref 6–23)
CO2: 25 mEq/L (ref 19–32)
Chloride: 102 mEq/L (ref 96–112)
Glucose, Bld: 136 mg/dL — ABNORMAL HIGH (ref 70–99)
Potassium: 3.9 mEq/L (ref 3.5–5.1)

## 2011-11-24 MED ORDER — OXYCODONE HCL 5 MG PO TABS
5.0000 mg | ORAL_TABLET | ORAL | Status: DC
  Administered 2011-11-24 – 2011-11-25 (×6): 5 mg via ORAL
  Filled 2011-11-24 (×6): qty 1

## 2011-11-24 NOTE — Care Management Note (Unsigned)
    Page 1 of 1   11/24/2011     11:40:47 AM   CARE MANAGEMENT NOTE 11/24/2011  Patient:  April Walker, April Walker   Account Number:  [id number]  Date Initiated:  11/24/2011  Documentation initiated by:  Sharrie Rothman  Subjective/Objective Assessment:   Pt admitted from home with left knee osteoarthritis. Pt lives alone and was independent with ADL's PTA. Pt is s/p left total knee.     Action/Plan:   Pt will need short term SNF since she lives alone and will not have any help in the home at discharge. CSW is aware and will start bed search.   Anticipated DC Date:  11/30/2011   Anticipated DC Plan:  SKILLED NURSING FACILITY  In-house referral  Clinical Social Worker      DC Planning Services  CM consult      Choice offered to / List presented to:             Status of service:  In process, will continue to follow Medicare Important Message given?   (If response is "NO", the following Medicare IM given date fields will be blank) Date Medicare IM given:   Date Additional Medicare IM given:    Discharge Disposition:    Per UR Regulation:    If discussed at Long Length of Stay Meetings, dates discussed:    Comments:  11/24/11 1140 Arlyss Queen, RN BSN CM

## 2011-11-24 NOTE — Clinical Social Work Note (Signed)
CSW called [employer] to check pt's benefits and pt is aware she has a 400 copay and will have to pay 20% after that.  Pt states she is unable to afford this and is requesting home health benefits check.  CSW notified Tammy, CM who will follow up.  CSW will check with pt in the morning following PT to ensure this is appropriate plan before signing off.  Karn Cassis

## 2011-11-24 NOTE — Clinical Social Work Placement (Signed)
Clinical Social Work Department CLINICAL SOCIAL WORK PLACEMENT NOTE 11/24/2011  Patient:  April Walker, April Walker  Account Number:  [id number] Admit date:  11/23/2011  Clinical Social Worker:  Sherrlyn Hock  Date/time:  11/24/2011 03:00 PM  Clinical Social Work is seeking post-discharge placement for this patient at the following level of care:   SKILLED NURSING   (*CSW will update this form in Epic as items are completed)   11/24/2011  Patient/family provided with [facility] Department of Clinical Social Work's list of facilities offering this level of care within the geographic area requested by the patient (or if unable, by the patient's family).  11/24/2011  Patient/family informed of their freedom to choose among providers that offer the needed level of care, that participate in Medicare, Medicaid or managed care program needed by the patient, have an available bed and are willing to accept the patient.  11/24/2011  Patient/family informed of [facility]' ownership interest in [facility], as well as of the fact that they are under no obligation to receive care at this facility.  PASARR submitted to EDS on 11/24/2011 PASARR number received from [employer] on 11/24/2011  FL2 transmitted to all facilities in geographic area requested by pt/family on  11/24/2011 FL2 transmitted to all facilities within larger geographic area on   Patient informed that his/her managed care company has contracts with or will negotiate with  certain facilities, including the following:     Patient/family informed of bed offers received:   Patient chooses bed at  Physician recommends and patient chooses bed at    Patient to be transferred to  on   Patient to be transferred to facility by   The following physician request were entered in Epic:   Additional Comments:  Karn Cassis

## 2011-11-24 NOTE — Plan of Care (Signed)
Problem: Phase II Progression Outcomes Goal: Ambulates Outcome: Progressing Weight bearing as tolerated,patient was out of bed to chair this am. Goal: Other Phase II Outcomes/Goals Outcome: Progressing Foley d/c'ed this am.

## 2011-11-24 NOTE — Clinical Social Work Note (Signed)
Clinical Social Work Department BRIEF PSYCHOSOCIAL ASSESSMENT 11/24/2011  Patient:  April Walker, April Walker     Account Number:  [id number]     Admit date:  11/23/2011  Clinical Social Worker:  Sherrlyn Hock  Date/Time:  11/24/2011 03:00 PM  Referred by:  Physician  Date Referred:  11/24/2011 Referred for  SNF Placement   Other Referral:   Interview type:  Patient Other interview type:    PSYCHOSOCIAL DATA Living Status:  ALONE Admitted from facility:   Level of care:   Primary support name:  friends/neighbors Primary support relationship to patient:  FRIEND Degree of support available:   adequate per pt    CURRENT CONCERNS Current Concerns  Post-Acute Placement   Other Concerns:    SOCIAL WORK ASSESSMENT / PLAN CSW met with pt at bedside following MD referral for SNF. Pt alert and oriented and reports she lives alone.  Pt works at [employer]. She states her family provide support but relies on her friends/neighbors for any assistance as they are nearby.  Pt feels SNF will be necessary as it would be more difficult to have different people stay with her at home.  PT recommending SNF.  CSW to fax out FL2.   Assessment/plan status:  Psychosocial Support/Ongoing Assessment of Needs Other assessment/ plan:   CSW to continue to follow and will return with bed offers when available.   Information/referral to community resources:   n/a    PATIENT'S/FAMILY'S RESPONSE TO PLAN OF CARE: Pt reports positive feelings regarding [facility] for rehab.        Karn Cassis

## 2011-11-24 NOTE — Addendum Note (Signed)
Addendum  created 11/24/11 0800 by Marolyn Hammock, CRNA   Modules edited:Notes Section

## 2011-11-24 NOTE — Progress Notes (Signed)
Subjective: 1 Day Post-Op Procedure(s) (LRB): TOTAL KNEE ARTHROPLASTY (Left) Patient reports pain as 6 on 0-10 scale.    Objective: Vital signs in last 24 hours: Temp:  [97.5 F (36.4 C)-98.6 F (37 C)] 98.1 F (36.7 C) (05/21 0534) Pulse Rate:  [68-87] 83  (05/21 0534) Resp:  [12-22] 20  (05/21 0534) BP: (107-154)/(60-99) 154/84 mmHg (05/21 0534) SpO2:  [91 %-100 %] 91 % (05/21 0534) Weight:  [97.523 kg (215 lb)] 97.523 kg (215 lb) (05/20 0752)  Intake/Output from previous day: 05/20 0701 - 05/21 0700 In: 1531.7 [I.V.:1531.7] Out: 750 [Urine:500; Drains:250] Intake/Output this shift:     Basename 11/24/11 0513  HGB 10.5*    Basename 11/24/11 0513  WBC 11.6*  RBC 3.90  HCT 31.6*  PLT 211    Basename 11/24/11 0513  NA 135  K 3.9  CL 102  CO2 25  BUN 11  CREATININE 0.54  GLUCOSE 136*  CALCIUM 9.0   No results found for this basename: LABPT:2,INR:2 in the last 72 hours  Neurologically intact Sensation intact distally Dorsiflexion/Plantar flexion intact Compartment soft  Assessment/Plan: 1 Day Post-Op Procedure(s) (LRB): TOTAL KNEE ARTHROPLASTY (Left) Advance diet Up with therapy Start oral pain meds Decrease ivfs Medicate as needed for nausea  Apply ice to left knee every 2 hours  [name] 11/24/2011, 7:31 AM

## 2011-11-24 NOTE — Evaluation (Signed)
Physical Therapy Evaluation Patient Details Name: April Walker MRN: [id number] DOB: 1950-08-29 Today's Date: 11/24/2011 Time: 4098-1191 PT Time Calculation (min): 34 min  PT Assessment / Plan / Recommendation Clinical Impression  Pt is extremely cooperative and well motivated, had attended total joint replacement class.  We initiated TKR protocol, ROM L knee= 16-54 deg, AA.  Min to min guard assist needed for transfers and limited gait with walker.  She is planning to go to SNF at discharge as she lives alone.  We will follow per protocol.    PT Assessment  Patient needs continued PT services    Follow Up Recommendations  Skilled nursing facility    Barriers to Discharge Decreased caregiver support also has 2 steps into home, no railing    lEquipment Recommendations  Defer to next venue    Recommendations for Other Services     Frequency Min 6X/week    Precautions / Restrictions Precautions Precautions: Knee Precaution Booklet Issued: No Precaution Comments: pt attended total joint replacement class...has booklet Restrictions Weight Bearing Restrictions: Yes LLE Weight Bearing: Weight bearing as tolerated Other Position/Activity Restrictions: no pillow under L knee   Pertinent Vitals/Pain       Mobility  Bed Mobility Bed Mobility: Supine to Sit;Sit to Supine Supine to Sit: HOB elevated;4: Min assist Sit to Supine: Not Tested (comment) Details for Bed Mobility Assistance: min assist of LLE needed Transfers Transfers: Sit to Stand;Stand to Sit;Squat Pivot Transfers Sit to Stand: 4: Min guard;With upper extremity assist;From elevated surface Stand to Sit: 4: Min guard;With upper extremity assist;To chair/3-in-1 Squat Pivot Transfers: 4: Min assist;With upper extremity assistance;From elevated surface Ambulation/Gait Ambulation/Gait Assistance: 4: Min assist Ambulation Distance (Feet): 2 Feet Assistive device: Standard walker Ambulation/Gait Assistance Details:  unable to do a gait analysis due to short distance walked Gait Pattern: Antalgic Stairs: No Wheelchair Mobility Wheelchair Mobility: No    Exercises Total Joint Exercises Ankle Circles/Pumps: AROM;Both;10 reps;Supine Quad Sets: AROM;Both;10 reps;Supine Short Arc Quad: AAROM;Left;10 reps;Seated Knee Flexion: AAROM;Left;10 reps;Seated Goniometric ROM: 16-54 deg, AA   PT Diagnosis: Difficulty walking;Abnormality of gait;Generalized weakness;Acute pain  PT Problem List: Decreased strength;Decreased range of motion;Decreased activity tolerance;Decreased mobility;Decreased knowledge of use of DME;Decreased safety awareness;Decreased knowledge of precautions;Pain PT Treatment Interventions: DME instruction;Gait training;Functional mobility training;Therapeutic activities;Therapeutic exercise;Patient/family education   PT Goals Acute Rehab PT Goals PT Goal Formulation: With patient Time For Goal Achievement: 12/01/11 Potential to Achieve Goals: Good Pt will go Supine/Side to Sit: with supervision;with HOB not 0 degrees (comment degree) PT Goal: Supine/Side to Sit - Progress: Goal set today Pt will go Sit to Supine/Side: with supervision;with HOB not 0 degrees (comment degree) PT Goal: Sit to Supine/Side - Progress: Goal set today Pt will go Sit to Stand: with modified independence;with upper extremity assist PT Goal: Sit to Stand - Progress: Goal set today Pt will go Stand to Sit: with modified independence;with upper extremity assist PT Goal: Stand to Sit - Progress: Goal set today Pt will Transfer Bed to Chair/Chair to Bed: with modified independence PT Transfer Goal: Bed to Chair/Chair to Bed - Progress: Goal set today Pt will Ambulate: 16 - 50 feet;with supervision;with rolling walker PT Goal: Ambulate - Progress: Goal set today  Visit Information  Last PT Received On: 11/24/11    Subjective Data  Subjective: I'm feeling OK Patient Stated Goal: return to work, no knee pain     Prior Functioning  Home Living Lives With: Alone Available Help at Discharge: Friend(s) Type of Home: House Home  Access: Stairs to enter [employer] of Steps: 2 Entrance Stairs-Rails: None Home Layout: One level Firefighter: Standard Home Adaptive Equipment: Bedside commode/3-in-1;Walker - standard Additional Comments: walker/BSC are borrowed...walker is quite old and worn Prior Function Level of Independence: Independent Able to Take Stairs?: Yes Driving: Yes Vocation: Full time employment Communication Communication: No difficulties    Cognition  Overall Cognitive Status: Appears within functional limits for tasks assessed/performed Arousal/Alertness: Awake/alert Orientation Level: Appears intact for tasks assessed Behavior During Session: [facility] for tasks performed    Extremity/Trunk Assessment Right Upper Extremity Assessment RUE ROM/Strength/Tone: Within functional levels RUE Sensation: WFL - Light Touch;WFL - Proprioception RUE Coordination: WFL - gross motor Left Upper Extremity Assessment LUE ROM/Strength/Tone: Within functional levels LUE Sensation: WFL - Light Touch;WFL - Proprioception LUE Coordination: WFL - gross motor Right Lower Extremity Assessment RLE ROM/Strength/Tone: Within functional levels RLE Sensation: WFL - Light Touch;WFL - Proprioception RLE Coordination: WFL - gross motor Left Lower Extremity Assessment LLE ROM/Strength/Tone: Deficits LLE ROM/Strength/Tone Deficits: ROM of knee = 16-54 deg, AA...hip and ankle are WNL LLE Sensation: WFL - Light Touch;WFL - Proprioception LLE Coordination: WFL - gross motor Trunk Assessment Trunk Assessment: Normal   Balance Balance Balance Assessed: No  End of Session PT - End of Session Equipment Utilized During Treatment: Gait belt Activity Tolerance: Patient tolerated treatment well Patient left: in chair;with call bell/phone within reach   Myrlene Broker L 11/24/2011, 10:08 AM

## 2011-11-24 NOTE — Anesthesia Postprocedure Evaluation (Signed)
  Anesthesia Post-op Note  Patient: April Walker  Procedure(s) Performed: Procedure(s) (LRB): TOTAL KNEE ARTHROPLASTY (Left)  Patient Location: Room 334  Anesthesia Type: Spinal  Level of Consciousness: awake, alert , oriented and patient cooperative  Airway and Oxygen Therapy: Patient Spontanous Breathing  Post-op Pain: moderate  Post-op Assessment: Post-op Vital signs reviewed, Patient's Cardiovascular Status Stable, Respiratory Function Stable, Patent Airway, Adequate PO intake and Pain level controlled  Post-op Vital Signs: Reviewed and stable  Complications: No apparent anesthesia complications

## 2011-11-24 NOTE — Progress Notes (Signed)
Physical Therapy Treatment Patient Details Name: April Walker MRN: [id number] DOB: Jun 11, 1951 Today's Date: 11/24/2011 Time: 1478-2956 PT Time Calculation (min): 27 min  PT Assessment / Plan / Recommendation Comments on Treatment Session  CPM set and applied 0-60degrees at 1130 needs to come off at 1730. Pt c/o of pain and "being tired" and prefers to attempt gait training tomorrow. Exercises completed    Follow Up Recommendations  Skilled nursing facility    Barriers to Discharge Decreased caregiver support also has 2 steps into home, no railing    Equipment Recommendations  Defer to next venue    Recommendations for Other Services    Frequency Min 6X/week   Plan      Precautions / Restrictions Precautions Precautions: Knee Precaution Booklet Issued: No Precaution Comments: pt attended total joint replacement class...has booklet Restrictions Weight Bearing Restrictions: Yes LLE Weight Bearing: Weight bearing as tolerated Other Position/Activity Restrictions: no pillow under L knee   Pertinent Vitals/Pain     Mobility  Bed Mobility Bed Mobility: Supine to Sit;Sit to Supine Supine to Sit: HOB elevated;4: Min assist Sit to Supine: Not Tested (comment) Details for Bed Mobility Assistance: min assist of LLE needed Transfers Transfers: Sit to Stand;Stand to Sit;Squat Pivot Transfers Sit to Stand: 4: Min guard;With upper extremity assist;From elevated surface Stand to Sit: 4: Min guard;With upper extremity assist;To chair/3-in-1 Squat Pivot Transfers: 4: Min assist;With upper extremity assistance;From elevated surface Ambulation/Gait Ambulation/Gait Assistance: 4: Min assist Ambulation Distance (Feet): 2 Feet Assistive device: Standard walker Ambulation/Gait Assistance Details: unable to do a gait analysis due to short distance walked Gait Pattern: Antalgic Stairs: No Wheelchair Mobility Wheelchair Mobility: No    Exercises Total Joint Exercises Ankle  Circles/Pumps: Both;20 reps Quad Sets: Both;15 reps Short Arc Quad: Both;10 reps Heel Slides: Left;10 reps;AAROM Knee Flexion: AAROM;Left;10 reps;Seated Goniometric ROM: 16-54 deg, AA   PT Diagnosis: Difficulty walking;Abnormality of gait;Generalized weakness;Acute pain  PT Problem List: Decreased strength;Decreased range of motion;Decreased activity tolerance;Decreased mobility;Decreased knowledge of use of DME;Decreased safety awareness;Decreased knowledge of precautions;Pain PT Treatment Interventions: DME instruction;Gait training;Functional mobility training;Therapeutic activities;Therapeutic exercise;Patient/family education   PT Goals Acute Rehab PT Goals PT Goal Formulation: With patient Time For Goal Achievement: 12/01/11 Potential to Achieve Goals: Good Pt will go Supine/Side to Sit: with supervision;with HOB not 0 degrees (comment degree) PT Goal: Supine/Side to Sit - Progress: Goal set today Pt will go Sit to Supine/Side: with supervision;with HOB not 0 degrees (comment degree) PT Goal: Sit to Supine/Side - Progress: Goal set today Pt will go Sit to Stand: with modified independence;with upper extremity assist PT Goal: Sit to Stand - Progress: Goal set today Pt will go Stand to Sit: with modified independence;with upper extremity assist PT Goal: Stand to Sit - Progress: Goal set today Pt will Transfer Bed to Chair/Chair to Bed: with modified independence PT Transfer Goal: Bed to Chair/Chair to Bed - Progress: Goal set today Pt will Ambulate: 16 - 50 feet;with supervision;with rolling walker PT Goal: Ambulate - Progress: Goal set today  Visit Information  Last PT Received On: 11/24/11    Subjective Data  Subjective: I'm feeling OK Patient Stated Goal: return to work, no knee pain   Cognition  Overall Cognitive Status: Appears within functional limits for tasks assessed/performed Arousal/Alertness: Awake/alert Orientation Level: Appears intact for tasks  assessed Behavior During Session: [name] for tasks performed    Balance  Balance Balance Assessed: No  End of Session PT - End of Session Equipment Utilized During Treatment: Gait  belt Activity Tolerance: Patient limited by pain;Patient tolerated treatment well Patient left: in bed;with call bell/phone within reach;with bed alarm set    April Walker 11/24/2011, 11:48 AM

## 2011-11-25 LAB — CBC
HCT: 30.8 % — ABNORMAL LOW (ref 36.0–46.0)
Hemoglobin: 10 g/dL — ABNORMAL LOW (ref 12.0–15.0)
MCH: 26.7 pg (ref 26.0–34.0)
MCHC: 32.5 g/dL (ref 30.0–36.0)
MCV: 82.4 fL (ref 78.0–100.0)

## 2011-11-25 MED ORDER — SODIUM CHLORIDE 0.9 % IJ SOLN
INTRAMUSCULAR | Status: AC
  Administered 2011-11-25: 17:00:00
  Filled 2011-11-25: qty 3

## 2011-11-25 MED ORDER — OXYCODONE-ACETAMINOPHEN 5-325 MG PO TABS
1.0000 | ORAL_TABLET | ORAL | Status: AC
  Administered 2011-11-25 (×4): 1 via ORAL
  Filled 2011-11-25 (×4): qty 1

## 2011-11-25 MED ORDER — METHOCARBAMOL 500 MG PO TABS
500.0000 mg | ORAL_TABLET | Freq: Four times a day (QID) | ORAL | Status: DC
  Administered 2011-11-25 – 2011-11-26 (×3): 500 mg via ORAL
  Filled 2011-11-25 (×3): qty 1

## 2011-11-25 NOTE — Clinical Social Work Note (Signed)
Pt reconsidered SNF last night and now feels this is best option.  Awaiting bed offers.    Karn Cassis

## 2011-11-25 NOTE — Progress Notes (Signed)
   Attempted to evaluate patient this morning, however, patient was in conference with social work.  Will attempt OT evaluation as schedule allows.  Shirlean Mylar, OTR/L  11/25/2011, 9:35 AM

## 2011-11-25 NOTE — Progress Notes (Signed)
Able to ambulate with walker stand by assist only no nausea vomiting. Passing gas through rectum

## 2011-11-25 NOTE — Progress Notes (Signed)
Physical Therapy Treatment Patient Details Name: April Walker MRN: [id number] DOB: 03-25-1951 Today's Date: 11/25/2011 Time: 4098-1191 PT Time Calculation (min): 27 min  PT Assessment / Plan / Recommendation Comments on Treatment Session  Patient continues to increase gait distance; 100' with RW;min guard with step through pattern.    Follow Up Recommendations       Barriers to Discharge        Equipment Recommendations       Recommendations for Other Services    Frequency     Plan Discharge plan remains appropriate;Frequency remains appropriate    Precautions / Restrictions Restrictions Weight Bearing Restrictions: Yes LLE Weight Bearing: Weight bearing as tolerated   Pertinent Vitals/Pain     Mobility  Bed Mobility Supine to Sit: 6: Modified independent (Device/Increase time);HOB elevated Sit to Supine: 6: Modified independent (Device/Increase time) Transfers Sit to Stand: 5: Supervision Stand to Sit: 5: Supervision Ambulation/Gait Ambulation/Gait Assistance: 4: Min guard Ambulation Distance (Feet): 100 Feet Assistive device: Rolling walker Gait Pattern: Step-through pattern;Decreased stance time - right;Decreased stance time - left Gait velocity: slow General Gait Details: Pt instructed in equalizing step lengths and in proper gait sequence Stairs: No Wheelchair Mobility Wheelchair Mobility: No    Exercises Total Joint Exercises Ankle Circles/Pumps: AROM;Both;10 reps;Supine Quad Sets: AROM;Both;10 reps;Supine Towel Squeeze: AROM;Both;10 reps;Supine Short Arc Quad: AAROM;Left;10 reps;Supine Heel Slides: AAROM;10 reps;Left;Supine Goniometric ROM: 12-80 deg, AA General Exercises - Lower Extremity Quad Sets: 10 reps;Left Short Arc Quad: Left;10 reps Heel Slides: Seated;10 reps Other Exercises Other Exercises: passive knee flexion stretch 3x30" Other Exercises: passive knee ext stretch 3x30"   PT Diagnosis:    PT Problem List:   PT Treatment  Interventions:     PT Goals Acute Rehab PT Goals PT Goal: Supine/Side to Sit - Progress: Met PT Goal: Sit to Supine/Side - Progress: Met PT Goal: Sit to Stand - Progress: Progressing toward goal Pt will go Stand to Sit: with modified independence PT Goal: Stand to Sit - Progress: Progressing toward goal Pt will Ambulate: 51 - 150 feet;with rolling walker;with supervision PT Goal: Ambulate - Progress: Progressing toward goal  Visit Information  Last PT Received On: 11/25/11    Subjective Data  Subjective: feels better Patient Stated Goal: return to work   Agricultural consultant Assessed: Yes  End of Session PT - End of Session Equipment Utilized During Treatment: Gait belt Activity Tolerance: Patient tolerated treatment well Patient left: in bed;with call bell/phone within reach;with bed alarm set Nurse Communication: Mobility status    Sabrina Arriaga ATKINSO 11/25/2011, 12:28 PM

## 2011-11-25 NOTE — Progress Notes (Signed)
Subjective: 2 Days Post-Op Procedure(s) (LRB): TOTAL KNEE ARTHROPLASTY (Left) Patient reports pain as 6 on 0-10 scale.    Objective: Vital signs in last 24 hours: Temp:  [98.1 F (36.7 C)-98.7 F (37.1 C)] 98.1 F (36.7 C) (05/22 0521) Pulse Rate:  [82-103] 87  (05/22 0521) Resp:  [19-20] 20  (05/22 0521) BP: (122-161)/(68-86) 122/68 mmHg (05/22 0521) SpO2:  [94 %-99 %] 94 % (05/22 0521)  Intake/Output from previous day: 05/21 0701 - 05/22 0700 In: 3032 [I.V.:2647; IV Piggyback:385] Out: 1130 [Urine:1000; Drains:130] Intake/Output this shift: Total I/O In: -  Out: 55 [Drains:55]   Basename 11/25/11 0530 11/24/11 0513  HGB 10.0* 10.5*    Basename 11/25/11 0530 11/24/11 0513  WBC 10.2 11.6*  RBC 3.74* 3.90  HCT 30.8* 31.6*  PLT 198 211    Basename 11/24/11 0513  NA 135  K 3.9  CL 102  CO2 25  BUN 11  CREATININE 0.54  GLUCOSE 136*  CALCIUM 9.0   No results found for this basename: LABPT:2,INR:2 in the last 72 hours  Neurologically intact Neurovascular intact Sensation intact distally Intact pulses distally Dorsiflexion/Plantar flexion intact Incision: dressing C/D/I Compartment soft  Assessment/Plan: 2 Days Post-Op Procedure(s) (LRB): TOTAL KNEE ARTHROPLASTY (Left) Advance diet Up with therapy D/C IV fluids  [name] 11/25/2011, 7:36 AM

## 2011-11-25 NOTE — Progress Notes (Signed)
Physical Therapy Treatment Patient Details Name: April Walker MRN: [id number] DOB: 1951/03/29 Today's Date: 11/25/2011 Time: 0918-1020 PT Time Calculation (min): 62 min  PT Assessment / Plan / Recommendation Comments on Treatment Session  L knee ROM is 12-8- deg with stress on developing increased knee extension using passive stretch and knee bridging.  She is tolerating ther ex well and is increasing gait tolerance slowly.    Follow Up Recommendations       Barriers to Discharge        Equipment Recommendations       Recommendations for Other Services    Frequency     Plan Discharge plan remains appropriate;Frequency remains appropriate    Precautions / Restrictions     Pertinent Vitals/Pain     Mobility  Bed Mobility Supine to Sit: 6: Modified independent (Device/Increase time);HOB elevated Sit to Supine: 6: Modified independent (Device/Increase time) Transfers Sit to Stand: 5: Supervision Stand to Sit: 5: Supervision Ambulation/Gait Ambulation/Gait Assistance: 4: Min guard Assistive device: Rolling walker Gait Pattern: Step-to pattern;Decreased stance time - left;Decreased step length - right General Gait Details: Pt instructed in equalizing step lengths and in proper gait sequence Stairs: No Wheelchair Mobility Wheelchair Mobility: No    Exercises Total Joint Exercises Ankle Circles/Pumps: AROM;Both;10 reps;Supine Quad Sets: AROM;Both;10 reps;Supine Towel Squeeze: AROM;Both;10 reps;Supine Short Arc Quad: AAROM;Left;10 reps;Supine Heel Slides: AAROM;10 reps;Left;Supine Goniometric ROM: 12-80 deg, AA   PT Diagnosis:    PT Problem List:   PT Treatment Interventions:     PT Goals Acute Rehab PT Goals PT Goal: Supine/Side to Sit - Progress: Met PT Goal: Sit to Supine/Side - Progress: Met PT Goal: Sit to Stand - Progress: Progressing toward goal PT Goal: Stand to Sit - Progress: Progressing toward goal Pt will Ambulate: 51 - 150 feet;with rolling  walker;with supervision PT Goal: Ambulate - Progress: Updated due to goal met  Visit Information  Last PT Received On: 11/25/11    Subjective Data  Subjective: feels better Patient Stated Goal: return to work   Agricultural consultant Assessed: Yes  End of Session PT - End of Session Equipment Utilized During Treatment: Gait belt Activity Tolerance: Patient tolerated treatment well Patient left: in chair;with call bell/phone within reach    [place], Debarah Crape L 11/25/2011, 10:30 AM

## 2011-11-25 NOTE — Evaluation (Signed)
Occupational Therapy Evaluation Patient Details Name: April Walker MRN: [id number] DOB: [birth year] Today's Date: 11/25/2011 Time: 0865-7846 OT Time Calculation (min): 20 min  OT Assessment / Plan / Recommendation Clinical Impression  A: Patient with decreased I with functional activities, BADLs, functional transfers s/p left total knee replacement on 11/23/11.    OT Assessment  Patient does not need any further OT services    Follow Up Recommendations  No OT follow up    Barriers to Discharge      Equipment Recommendations  Defer to next venue    Recommendations for Other Services    Frequency       Precautions / Restrictions Precautions Precautions: Knee Restrictions LLE Weight Bearing: Weight bearing as tolerated   Pertinent Vitals/Pain     ADL  ADL Comments: Patient in CPM during evaluation, discussed use of adaptive equipment and dme for increased independence with BADLs at home.  Patient is planning to discharge  to SNF for rehab and further ADL needs can be addressed at that time.     OT Goals    Visit Information  Last OT Received On: 11/25/11    Subjective Data  Subjective: S:  I think I am going to go to the [facility] for some rehab. Patient Stated Goal: I want to be able to be up moving around.   Prior Functioning  Home Living Lives With: Alone Available Help at Discharge: Friend(s) Type of Home: House Home Access: Stairs to enter Entrance Stairs-Rails: None Home Layout: One level Firefighter: Standard Home Adaptive Equipment: Bedside commode/3-in-1 Additional Comments: is able to borrow a shower seat, if needed Prior Function Level of Independence: Independent Able to Take Stairs?: Yes Driving: Yes Vocation: Full time employment Communication Communication: No difficulties Dominant Hand: Right    Cognition  Overall Cognitive Status: Appears within functional limits for tasks assessed/performed Arousal/Alertness:  Awake/alert Orientation Level: Appears intact for tasks assessed Behavior During Session: [facility] for tasks performed    Extremity/Trunk Assessment Right Upper Extremity Assessment RUE ROM/Strength/Tone: Within functional levels RUE Sensation: WFL - Light Touch;WFL - Proprioception RUE Coordination: WFL - gross/fine motor;WFL - gross motor Left Upper Extremity Assessment LUE ROM/Strength/Tone: Within functional levels LUE Sensation: WFL - Light Touch;WFL - Proprioception LUE Coordination: WFL - gross motor;WFL - gross/fine motor                End of Session OT - End of Session Activity Tolerance: Patient tolerated treatment well Patient left: in bed;with call bell/phone within reach;Other (comment) (CPM in place)   Shirlean Mylar, OTR/L  11/25/2011, 4:29 PM

## 2011-11-26 ENCOUNTER — Inpatient Hospital Stay
Admission: RE | Admit: 2011-11-26 | Discharge: 2011-12-01 | Disposition: A | Discharge status: Home / Self Care | Payer: 59 | Source: Ambulatory Visit | Attending: Internal Medicine | Admitting: Internal Medicine

## 2011-11-26 LAB — CBC
HCT: 31.3 % — ABNORMAL LOW (ref 36.0–46.0)
Hemoglobin: 10.3 g/dL — ABNORMAL LOW (ref 12.0–15.0)
MCV: 81.1 fL (ref 78.0–100.0)
WBC: 10 10*3/uL (ref 4.0–10.5)

## 2011-11-26 LAB — TYPE AND SCREEN: Unit division: 0

## 2011-11-26 MED ORDER — SENNA 8.6 MG PO TABS: 1.0000 | ORAL_TABLET | Freq: Two times a day (BID) | ORAL | Status: DC

## 2011-11-26 MED ORDER — ASPIRIN 325 MG PO TBEC: 325.0000 mg | DELAYED_RELEASE_TABLET | Freq: Two times a day (BID) | ORAL | Status: DC

## 2011-11-26 MED ORDER — OXYCODONE-ACETAMINOPHEN 5-325 MG PO TABS: 1.0000 | ORAL_TABLET | ORAL | Status: DC | PRN

## 2011-11-26 MED ORDER — METHOCARBAMOL 500 MG PO TABS: 500.0000 mg | ORAL_TABLET | Freq: Four times a day (QID) | ORAL | Status: DC

## 2011-11-26 NOTE — Progress Notes (Signed)
Patient was transported to [facility] center via tunnel to go for rehab. Report called. Patient discharged to [facility] in stable condition.

## 2011-11-26 NOTE — Clinical Social Work Note (Signed)
Pt d/c today by MD to SNF.  CSW presented bed offers and pt chooses [facility].  Facility aware. Pt to transfer with RN. D/C summary faxed.  Pt is aware that insurance only authorized SNF through Tuesday.  Derenda Fennel, [place] [phone]

## 2011-11-26 NOTE — Discharge Summary (Signed)
Physician Discharge Summary  Patient ID: April Walker MRN: [id number] DOB/AGE: [birth year] 61 y.o.  Admit date: 11/23/2011 Discharge date: 11/26/2011  11/26/2011  Discharge summary  Admitting diagnosis osteoarthritis left knee  Discharge diagnosis same  Operative procedure left total knee arthroplasty  Implants: DePuy Sigma fixed-bearing posterior stabilized total knee. Size 5 femur, size 4 tibia, size 10 highly cross-link polyethylene posterior stabilized insert size 38 patella  Hospital course: The patient was admitted on May 20 for uncomplicated left total knee arthroplasty. She tolerated procedure well. She advanced in therapy as expected. Her pain was controlled with Percocet 5 mg. DVT prophylaxis included bilateral TED hose and Ecotrin 325 mg twice a day. (Low risk for DVT).  There were no complications  At discharge her vital signs were stable. Her incision was clean. Her calf was soft. She was neurovascularly intact.  Discharge disposition pending skilled nursing facility  CBC    Component Value Date/Time   WBC 10.0 11/26/2011 0525   RBC 3.86* 11/26/2011 0525   HGB 10.3* 11/26/2011 0525   HCT 31.3* 11/26/2011 0525   PLT 199 11/26/2011 0525   MCV 81.1 11/26/2011 0525   MCH 26.7 11/26/2011 0525   MCHC 32.9 11/26/2011 0525   RDW 14.1 11/26/2011 0525   LYMPHSABS 1.8 11/11/2011 1400   MONOABS 0.5 11/11/2011 1400   EOSABS 0.1 11/11/2011 1400   BASOSABS 0.1 11/11/2011 1400    Weightbearing status as tolerated   Discharge Orders    Future Appointments: Provider: Department: Dept Phone: Center:   12/07/2011 3:00 PM Vickki Hearing, MD [facility] [phone] [facility]     Future Orders Please Complete By Expires   Diet - low sodium heart healthy      Call MD / Call 911      Comments:   If you experience chest pain or shortness of breath, CALL 911 and be transported to the hospital emergency room.  If you develope a fever above 101 F, pus (white drainage) or increased  drainage or redness at the wound, or calf pain, call your surgeon's office.   Constipation Prevention      Comments:   Drink plenty of fluids.  Prune juice may be helpful.  You may use a stool softener, such as Colace (over the counter) 100 mg twice a day.  Use MiraLax (over the counter) for constipation as needed.   Increase activity slowly as tolerated      CPM      Comments:   Continuous passive motion machine (CPM):      Use the CPM from 0 to 80 for 6 hours per day.      You may increase by 10 per day.  You may break it up into 2 or 3 sessions per day.      Use CPM for 3 weeks or until you are told to stop.   TED hose      Comments:   Use stockings (TED hose) for 6 weeks on both leg(s).  You may remove them at night for sleeping.   Change dressing      Comments:   Change dressing daily, then change the dressing daily with sterile  gauze dressing and apply TED hose.  You may clean the incision with alcohol prior to redressing.   Do not put a pillow under the knee. Place it under the heel.      Driving restrictions      Comments:   No driving for 2 weeks  Medication List  As of 11/26/2011  8:01 AM   STOP taking these medications         diclofenac 50 MG EC tablet         TAKE these medications         aspirin 325 MG EC tablet   Take 1 tablet (325 mg total) by mouth 2 (two) times daily.      loratadine 10 MG tablet   Commonly known as: CLARITIN   Take 10 mg by mouth daily as needed. For allergies      methocarbamol 500 MG tablet   Commonly known as: ROBAXIN   Take 1 tablet (500 mg total) by mouth every 6 (six) hours.      oxyCODONE-acetaminophen 5-325 MG per tablet   Commonly known as: PERCOCET   Take 1 tablet by mouth every 4 (four) hours as needed for pain.      rosuvastatin 5 MG tablet   Commonly known as: CRESTOR   Take 5 mg by mouth daily.      senna 8.6 MG Tabs   Commonly known as: SENOKOT   Take 1 tablet (8.6 mg total) by mouth 2 (two) times daily.              Signed: [name] 11/26/2011, 8:01 AM

## 2011-11-26 NOTE — Clinical Social Work Placement (Signed)
Clinical Social Work Department CLINICAL SOCIAL WORK PLACEMENT NOTE 11/26/2011  Patient:  April Walker, April Walker  Account Number:  [id number] Admit date:  11/23/2011  Clinical Social Worker:  Sherrlyn Hock  Date/time:  11/24/2011 03:00 PM  Clinical Social Work is seeking post-discharge placement for this patient at the following level of care:   SKILLED NURSING   (*CSW will update this form in Epic as items are completed)   11/24/2011  Patient/family provided with [facility] Department of Clinical Social Work's list of facilities offering this level of care within the geographic area requested by the patient (or if unable, by the patient's family).  11/24/2011  Patient/family informed of their freedom to choose among providers that offer the needed level of care, that participate in Medicare, Medicaid or managed care program needed by the patient, have an available bed and are willing to accept the patient.  11/24/2011  Patient/family informed of [facility]' ownership interest in [facility], as well as of the fact that they are under no obligation to receive care at this facility.  PASARR submitted to EDS on 11/24/2011 PASARR number received from EDS on 11/24/2011  FL2 transmitted to all facilities in geographic area requested by pt/family on  11/24/2011 FL2 transmitted to all facilities within larger geographic area on   Patient informed that his/her managed care company has contracts with or will negotiate with  certain facilities, including the following:     Patient/family informed of bed offers received:  11/26/2011 Patient chooses bed at [facility] Physician recommends and patient chooses bed at  [facility]  Patient to be transferred to [facility] on  11/26/2011 Patient to be transferred to facility by RN  The following physician request were entered in Epic:   Additional Comments:  Derenda Fennel, LCSW [phone]

## 2011-11-26 NOTE — Progress Notes (Signed)
Subjective: Mild pain mild stiffness   Objective: Vital signs in last 24 hours: Temp:  [98.1 F (36.7 C)-98.7 F (37.1 C)] 98.1 F (36.7 C) (05/23 0545) Pulse Rate:  [84-90] 90  (05/23 0545) Resp:  [17-20] 17  (05/23 0545) BP: (121-136)/(69-71) 131/69 mmHg (05/23 0545) SpO2:  [93 %-98 %] 98 % (05/23 0545)  Intake/Output from previous day: 05/22 0701 - 05/23 0700 In: 1500 [P.O.:1500] Out: 55 [Drains:55] Intake/Output this shift:     Basename 11/26/11 0525 11/25/11 0530 11/24/11 0513  HGB 10.3* 10.0* 10.5*    Basename 11/26/11 0525 11/25/11 0530  WBC 10.0 10.2  RBC 3.86* 3.74*  HCT 31.3* 30.8*  PLT 199 198    Basename 11/24/11 0513  NA 135  K 3.9  CL 102  CO2 25  BUN 11  CREATININE 0.54  GLUCOSE 136*  CALCIUM 9.0   No results found for this basename: LABPT:2,INR:2 in the last 72 hours  Neurologically intact ABD soft Neurovascular intact Sensation intact distally Intact pulses distally Dorsiflexion/Plantar flexion intact Incision: dressing C/D/I Compartment soft  Assessment/Plan: Discharge   April Walker 11/26/2011, 7:50 AM

## 2011-11-27 ENCOUNTER — Encounter (HOSPITAL_COMMUNITY): Payer: Self-pay | Admitting: Orthopedic Surgery

## 2011-12-07 ENCOUNTER — Ambulatory Visit (INDEPENDENT_AMBULATORY_CARE_PROVIDER_SITE_OTHER): Payer: 59 | Admitting: Orthopedic Surgery

## 2011-12-07 ENCOUNTER — Encounter: Payer: Self-pay | Admitting: Orthopedic Surgery

## 2011-12-07 VITALS — BP 128/70 | Ht 68.0 in | Wt 215.0 lb

## 2011-12-07 DIAGNOSIS — Z96659 Presence of unspecified artificial knee joint: Secondary | ICD-10-CM

## 2011-12-07 DIAGNOSIS — Z96652 Presence of left artificial knee joint: Secondary | ICD-10-CM | POA: Insufficient documentation

## 2011-12-07 NOTE — Progress Notes (Signed)
Patient ID: April Walker, female   DOB: 05/03/1951, 61 y.o.   MRN: [id number] Chief Complaint  Patient presents with  . Routine Post Op    post op 1, DOS 11/23/11, LEFT TKA    depuy Sigma posterior stabilized fixed bearing total knee  Doing well incision clean Staples removed  DVT prophylaxis TED hose and stockings for the balance of 6 weeks  Continue CPM for the balance of 3 weeks  She does continue to have problems with extension she is encouraged to work on this for her therapy notes are reviewed and therapy is advised to work on this harder including prone hangs.

## 2011-12-07 NOTE — Patient Instructions (Signed)
CPM  Use CPM for 3 weeks or until you are told to stop.  TED hose  Comments:  Use stockings (TED hose) for 6 weeks on both leg(s). You may remove them at night for sleeping.  Change dressing  Comments:  Change dressing daily, then change the dressing daily with sterile gauze dressing and apply TED hose. You may clean the incision with alcohol prior to redressing.  Do not put a pillow under the knee. Place it under the heel.  Driving restrictions  Comments:  Driving when ready

## 2011-12-18 ENCOUNTER — Telehealth: Payer: Self-pay | Admitting: Orthopedic Surgery

## 2011-12-18 NOTE — Telephone Encounter (Signed)
[facility] will be discharging April Walker from home rehab this week.  Will need an outpatient rehab order faxed to  [facility] to begin therapy next week. The clinic's phone # is  [phone] and fax # [phone]

## 2011-12-22 ENCOUNTER — Other Ambulatory Visit: Payer: Self-pay | Admitting: *Deleted

## 2011-12-22 DIAGNOSIS — Z9889 Other specified postprocedural states: Secondary | ICD-10-CM

## 2011-12-22 NOTE — Telephone Encounter (Signed)
Order faxed.

## 2011-12-23 ENCOUNTER — Encounter: Payer: Self-pay | Admitting: Orthopedic Surgery

## 2011-12-29 ENCOUNTER — Ambulatory Visit: Payer: 59 | Attending: Orthopedic Surgery

## 2011-12-29 DIAGNOSIS — IMO0001 Reserved for inherently not codable concepts without codable children: Secondary | ICD-10-CM | POA: Insufficient documentation

## 2011-12-29 DIAGNOSIS — M25569 Pain in unspecified knee: Secondary | ICD-10-CM | POA: Insufficient documentation

## 2011-12-29 DIAGNOSIS — M6281 Muscle weakness (generalized): Secondary | ICD-10-CM | POA: Insufficient documentation

## 2011-12-29 DIAGNOSIS — R269 Unspecified abnormalities of gait and mobility: Secondary | ICD-10-CM | POA: Insufficient documentation

## 2011-12-31 ENCOUNTER — Ambulatory Visit: Payer: 59 | Admitting: Physical Therapy

## 2012-01-04 ENCOUNTER — Ambulatory Visit (INDEPENDENT_AMBULATORY_CARE_PROVIDER_SITE_OTHER): Payer: 59 | Admitting: Orthopedic Surgery

## 2012-01-04 ENCOUNTER — Encounter: Payer: Self-pay | Admitting: Orthopedic Surgery

## 2012-01-04 VITALS — BP 124/72 | Ht 68.0 in | Wt 215.0 lb

## 2012-01-04 DIAGNOSIS — Z96659 Presence of unspecified artificial knee joint: Secondary | ICD-10-CM

## 2012-01-04 MED ORDER — METHOCARBAMOL 500 MG PO TABS: 500.0000 mg | ORAL_TABLET | Freq: Four times a day (QID) | ORAL | Status: DC

## 2012-01-04 NOTE — Patient Instructions (Signed)
Continue therapy

## 2012-01-04 NOTE — Progress Notes (Signed)
Patient ID: April Walker, female   DOB: March 12, 1951, 61 y.o.   MRN: [id number] Chief Complaint  Patient presents with  . Routine Post Op    post op 2, Lt TKA, DOS 11/23/11    BP 124/72  Ht 5\' 8"  (1.727 m)  Wt 215 lb (97.523 kg)  BMI 32.69 kg/m2  total knee arthroplasty -left   Postop visit status post total knee arthroplasty Implant Depuy  posterior stabilized total knee replacement DVT prophylaxis Ecotrin twice a day with TED hose for 6 weeks  Living situation home  Complaints none  Exam 0-100 ROM  Plan CONTINUE PT  RTW AUG 20

## 2012-01-04 NOTE — Addendum Note (Signed)
Addended by: Adella Hare B on: 01/04/2012 02:36 PM   Modules accepted: Orders

## 2012-01-05 ENCOUNTER — Ambulatory Visit: Payer: 59 | Attending: Orthopedic Surgery

## 2012-01-05 DIAGNOSIS — M6281 Muscle weakness (generalized): Secondary | ICD-10-CM | POA: Insufficient documentation

## 2012-01-05 DIAGNOSIS — Z96659 Presence of unspecified artificial knee joint: Secondary | ICD-10-CM | POA: Insufficient documentation

## 2012-01-05 DIAGNOSIS — R269 Unspecified abnormalities of gait and mobility: Secondary | ICD-10-CM | POA: Insufficient documentation

## 2012-01-05 DIAGNOSIS — IMO0001 Reserved for inherently not codable concepts without codable children: Secondary | ICD-10-CM | POA: Insufficient documentation

## 2012-01-05 DIAGNOSIS — M25569 Pain in unspecified knee: Secondary | ICD-10-CM | POA: Insufficient documentation

## 2012-01-08 ENCOUNTER — Ambulatory Visit: Payer: 59 | Admitting: Physical Therapy

## 2012-01-12 ENCOUNTER — Ambulatory Visit: Payer: 59

## 2012-01-14 ENCOUNTER — Ambulatory Visit: Payer: 59

## 2012-01-20 ENCOUNTER — Ambulatory Visit: Payer: 59

## 2012-01-22 ENCOUNTER — Ambulatory Visit: Payer: 59

## 2012-01-26 ENCOUNTER — Ambulatory Visit: Payer: 59

## 2012-01-28 ENCOUNTER — Ambulatory Visit: Payer: 59

## 2012-02-02 ENCOUNTER — Ambulatory Visit: Payer: 59

## 2012-02-04 ENCOUNTER — Ambulatory Visit: Payer: 59 | Attending: Orthopedic Surgery

## 2012-02-04 DIAGNOSIS — M6281 Muscle weakness (generalized): Secondary | ICD-10-CM | POA: Insufficient documentation

## 2012-02-04 DIAGNOSIS — IMO0001 Reserved for inherently not codable concepts without codable children: Secondary | ICD-10-CM | POA: Insufficient documentation

## 2012-02-04 DIAGNOSIS — R269 Unspecified abnormalities of gait and mobility: Secondary | ICD-10-CM | POA: Insufficient documentation

## 2012-02-04 DIAGNOSIS — M25569 Pain in unspecified knee: Secondary | ICD-10-CM | POA: Insufficient documentation

## 2012-02-08 ENCOUNTER — Ambulatory Visit: Payer: 59 | Admitting: Physical Therapy

## 2012-02-10 ENCOUNTER — Ambulatory Visit (INDEPENDENT_AMBULATORY_CARE_PROVIDER_SITE_OTHER): Payer: 59 | Admitting: Orthopedic Surgery

## 2012-02-10 ENCOUNTER — Encounter: Payer: Self-pay | Admitting: Orthopedic Surgery

## 2012-02-10 ENCOUNTER — Ambulatory Visit: Payer: 59

## 2012-02-10 VITALS — BP 120/80 | Ht 68.0 in | Wt 204.0 lb

## 2012-02-10 DIAGNOSIS — Z96659 Presence of unspecified artificial knee joint: Secondary | ICD-10-CM

## 2012-02-10 MED ORDER — METHOCARBAMOL 500 MG PO TABS: 500.0000 mg | ORAL_TABLET | Freq: Four times a day (QID) | ORAL | Status: DC

## 2012-02-10 NOTE — Progress Notes (Signed)
Patient ID: April Walker, female   DOB: 1950/12/08, 61 y.o.   MRN: [id number] Chief Complaint  Patient presents with  . Follow-up    recheck left knee post TKA, DOS 11/23/11    BP 120/80  Ht 5\' 8"  (1.727 m)  Wt 204 lb (92.534 kg)  BMI 31.02 kg/m2  DOING WELL   TAKES TYLENOL AND MUSCLE RELAXER   rom 10-110  REFILL ROBAXIN   CONTINUE PT   RET TO WORK 02-11-12 1/2 DAY   RET TO CLINIC 3 MONS

## 2012-02-10 NOTE — Patient Instructions (Signed)
RTW 02/11/2012 1/2 DAY   02/23/2012 FULL DAYS

## 2012-02-16 ENCOUNTER — Ambulatory Visit: Payer: 59

## 2012-02-17 ENCOUNTER — Ambulatory Visit: Payer: 59 | Admitting: Physical Therapy

## 2012-04-19 ENCOUNTER — Other Ambulatory Visit: Payer: Self-pay | Admitting: Orthopedic Surgery

## 2012-05-09 ENCOUNTER — Encounter: Payer: Self-pay | Admitting: Orthopedic Surgery

## 2012-05-11 ENCOUNTER — Ambulatory Visit (INDEPENDENT_AMBULATORY_CARE_PROVIDER_SITE_OTHER): Payer: 59

## 2012-05-11 ENCOUNTER — Ambulatory Visit (INDEPENDENT_AMBULATORY_CARE_PROVIDER_SITE_OTHER): Payer: 59 | Admitting: Orthopedic Surgery

## 2012-05-11 VITALS — Ht 68.0 in | Wt 204.0 lb

## 2012-05-11 DIAGNOSIS — Z96659 Presence of unspecified artificial knee joint: Secondary | ICD-10-CM

## 2012-05-12 ENCOUNTER — Encounter: Payer: Self-pay | Admitting: Orthopedic Surgery

## 2012-05-12 NOTE — Progress Notes (Signed)
Patient ID: April Walker, female   DOB: 08-20-1950, 61 y.o.   MRN: [id number] Chief Complaint  Patient presents with  . Follow-up    3 month recheck on left TKA.   This is a 6 month followup status post total knee The patient had total knee replacement complains of no discomfort or pain at this time  Review of systems musculoskeletal system normal  Exam she is ambulatory without assistive device. Inspection reveals no swelling the incision has healed nicely. Her range of motion is 115. Muscle tone and strength are normal with extension. The knee is stable anteriorly and in the coronal plane. Neurovascular exam is normal in the left lower extremity  Radiographs were ordered annual total knee film  My interpretation is that the films show stability of the prosthesis with no loosening  She is in good condition at this time functioning well  She will return in 6 months

## 2012-05-24 ENCOUNTER — Other Ambulatory Visit: Payer: Self-pay | Admitting: Orthopedic Surgery

## 2012-05-24 ENCOUNTER — Telehealth: Payer: Self-pay | Admitting: Orthopedic Surgery

## 2012-05-24 DIAGNOSIS — Z9189 Other specified personal risk factors, not elsewhere classified: Secondary | ICD-10-CM

## 2012-05-24 MED ORDER — CEPHALEXIN 500 MG PO CAPS: 2000.0000 mg | ORAL_CAPSULE | Freq: Once | ORAL | Status: AC

## 2012-05-24 NOTE — Telephone Encounter (Signed)
April Walker is having a dental cleaning tomorrow and needs an antibiotic sent to [facility] [place], [place] Phone # [phone]  Fax # [phone]

## 2012-05-24 NOTE — Telephone Encounter (Signed)
Med at pharmacy

## 2012-08-15 ENCOUNTER — Other Ambulatory Visit (HOSPITAL_COMMUNITY): Payer: Self-pay | Admitting: Obstetrics & Gynecology

## 2012-08-15 DIAGNOSIS — Z139 Encounter for screening, unspecified: Secondary | ICD-10-CM

## 2012-08-20 ENCOUNTER — Other Ambulatory Visit: Payer: Self-pay

## 2012-09-06 ENCOUNTER — Ambulatory Visit (HOSPITAL_COMMUNITY): Payer: 59

## 2012-09-13 ENCOUNTER — Ambulatory Visit (HOSPITAL_COMMUNITY)
Admission: RE | Admit: 2012-09-13 | Discharge: 2012-09-13 | Disposition: A | Discharge status: Home / Self Care | Payer: 59 | Source: Ambulatory Visit | Attending: Obstetrics & Gynecology | Admitting: Obstetrics & Gynecology

## 2012-09-13 DIAGNOSIS — Z1231 Encounter for screening mammogram for malignant neoplasm of breast: Secondary | ICD-10-CM | POA: Insufficient documentation

## 2012-09-13 DIAGNOSIS — Z139 Encounter for screening, unspecified: Secondary | ICD-10-CM

## 2012-11-09 ENCOUNTER — Ambulatory Visit
Admission: RE | Admit: 2012-11-09 | Discharge: 2012-11-09 | Disposition: A | Discharge status: Home / Self Care | Payer: 59 | Source: Ambulatory Visit | Attending: Internal Medicine | Admitting: Internal Medicine

## 2012-11-09 ENCOUNTER — Other Ambulatory Visit: Payer: Self-pay | Admitting: Internal Medicine

## 2012-11-09 ENCOUNTER — Ambulatory Visit: Payer: 59 | Admitting: Orthopedic Surgery

## 2012-11-09 DIAGNOSIS — R1011 Right upper quadrant pain: Secondary | ICD-10-CM

## 2012-11-09 DIAGNOSIS — R05 Cough: Secondary | ICD-10-CM

## 2012-11-10 ENCOUNTER — Ambulatory Visit: Payer: 59 | Admitting: Orthopedic Surgery

## 2012-12-15 ENCOUNTER — Encounter: Payer: Self-pay | Admitting: Orthopedic Surgery

## 2012-12-15 ENCOUNTER — Ambulatory Visit (INDEPENDENT_AMBULATORY_CARE_PROVIDER_SITE_OTHER): Payer: 59 | Admitting: Orthopedic Surgery

## 2012-12-15 ENCOUNTER — Ambulatory Visit (INDEPENDENT_AMBULATORY_CARE_PROVIDER_SITE_OTHER): Payer: 59

## 2012-12-15 VITALS — Ht 68.0 in | Wt 215.0 lb

## 2012-12-15 DIAGNOSIS — Z96659 Presence of unspecified artificial knee joint: Secondary | ICD-10-CM

## 2012-12-15 DIAGNOSIS — Z96652 Presence of left artificial knee joint: Secondary | ICD-10-CM

## 2012-12-15 NOTE — Progress Notes (Signed)
Patient ID: April Walker, female   DOB: Oct 05, 1950, 63 y.o.   MRN: [id number] One year status post left knee replacement on 11/23/2011  Patient came in today with a chief complaint of routine followup status post left knee  History as needed  Review of systems negative  Exam ambulation is normal without assistive device no swelling incision healed nicely flexion ARC is 120 knee is stable coronal and sagittal plane strength is normal in extension as well as flexion. Mild peripheral edema chronic sensation normal.  X-ray ordered and reviewed show stable prosthesis  Status post total knee doing well followup one year for x-ray

## 2012-12-16 ENCOUNTER — Encounter: Payer: Self-pay | Admitting: Orthopedic Surgery

## 2013-01-02 ENCOUNTER — Telehealth: Payer: Self-pay | Admitting: Orthopedic Surgery

## 2013-01-02 NOTE — Telephone Encounter (Signed)
Patient called back relayed doctor's replty

## 2013-01-02 NOTE — Telephone Encounter (Signed)
April Walker left a message asking: 1.  Does she still need to take an antibiotic before teeth cleaning?  2.  Is it OK for her to run some mini marathons?   Her # [phone]

## 2013-01-02 NOTE — Telephone Encounter (Signed)
No running   Antibiotics yes

## 2013-01-02 NOTE — Telephone Encounter (Signed)
Left a message to call me back

## 2013-01-24 ENCOUNTER — Other Ambulatory Visit: Payer: Self-pay | Admitting: Obstetrics & Gynecology

## 2013-05-11 ENCOUNTER — Other Ambulatory Visit: Payer: Self-pay

## 2013-05-11 IMAGING — MG MM DIGITAL SCREENING BILAT
3 series · 3 of 3 positions shown · non-contrast
Comparison: Previous exams.

CLINICAL DATA: Screening.

DIGITAL BILATERAL SCREENING MAMMOGRAM WITH CAD

[L CC]
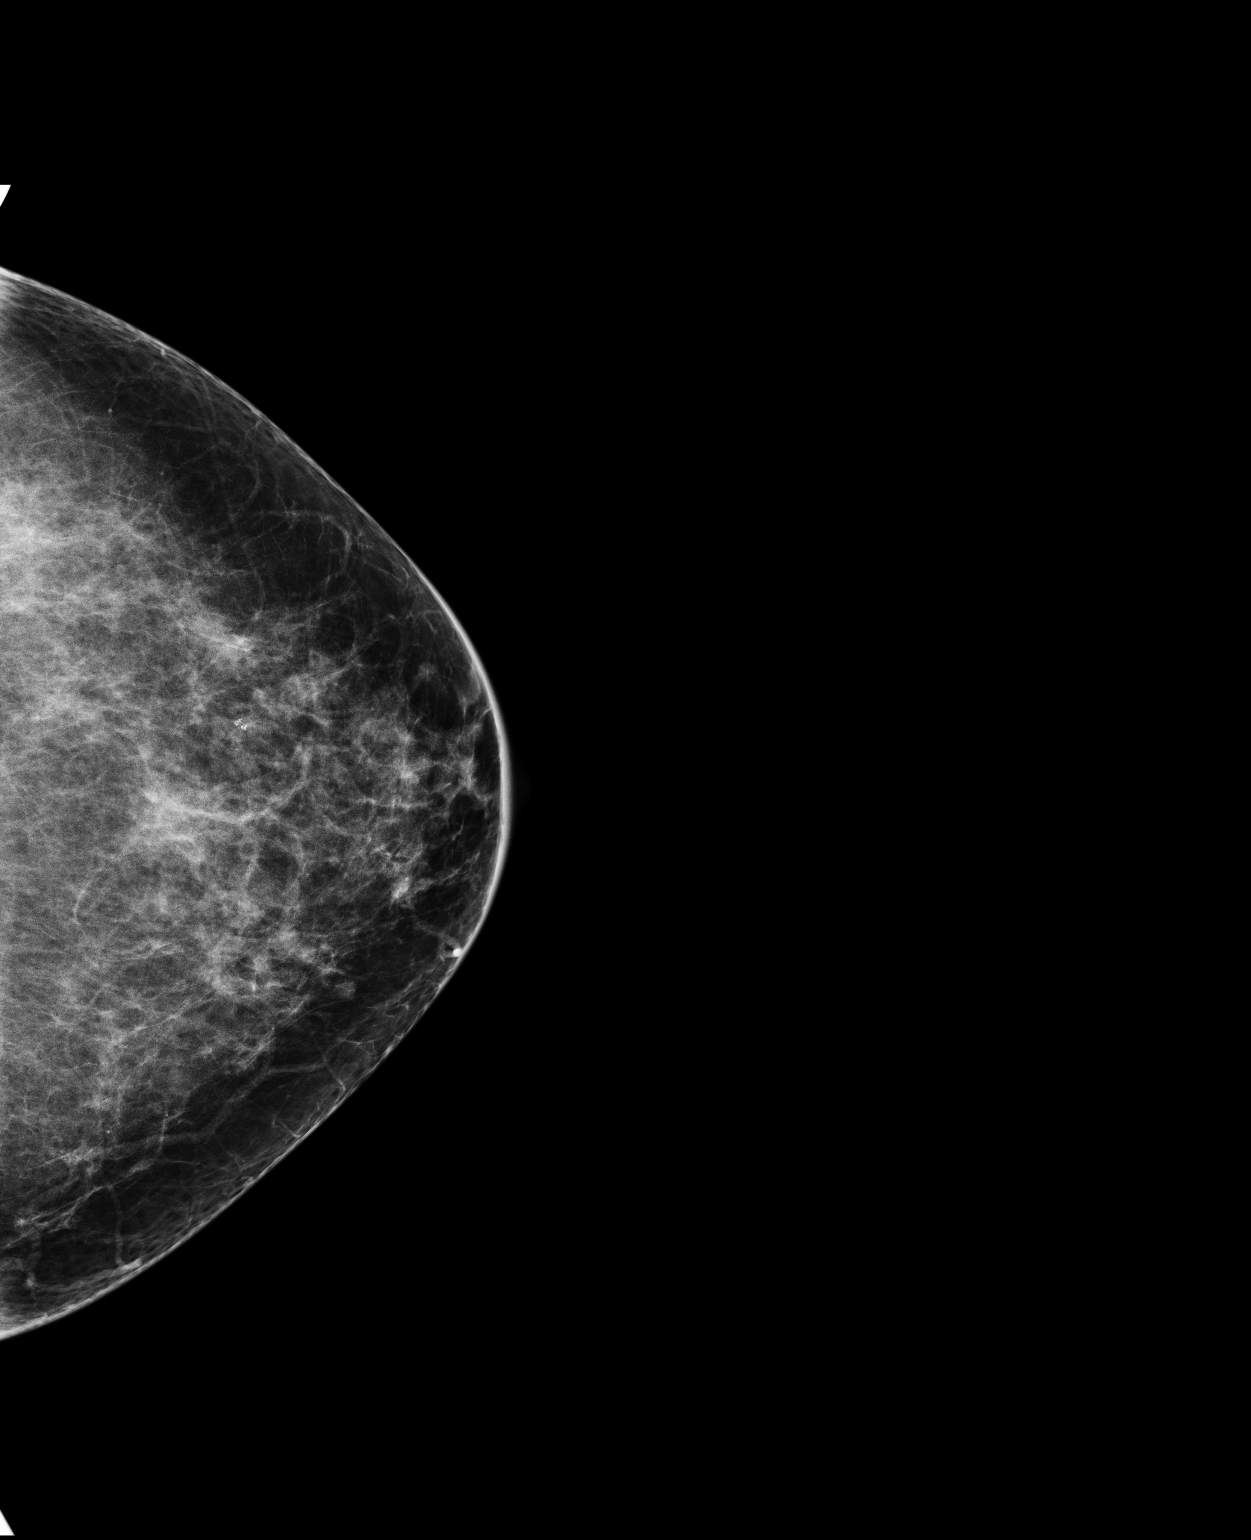

[L MLO]
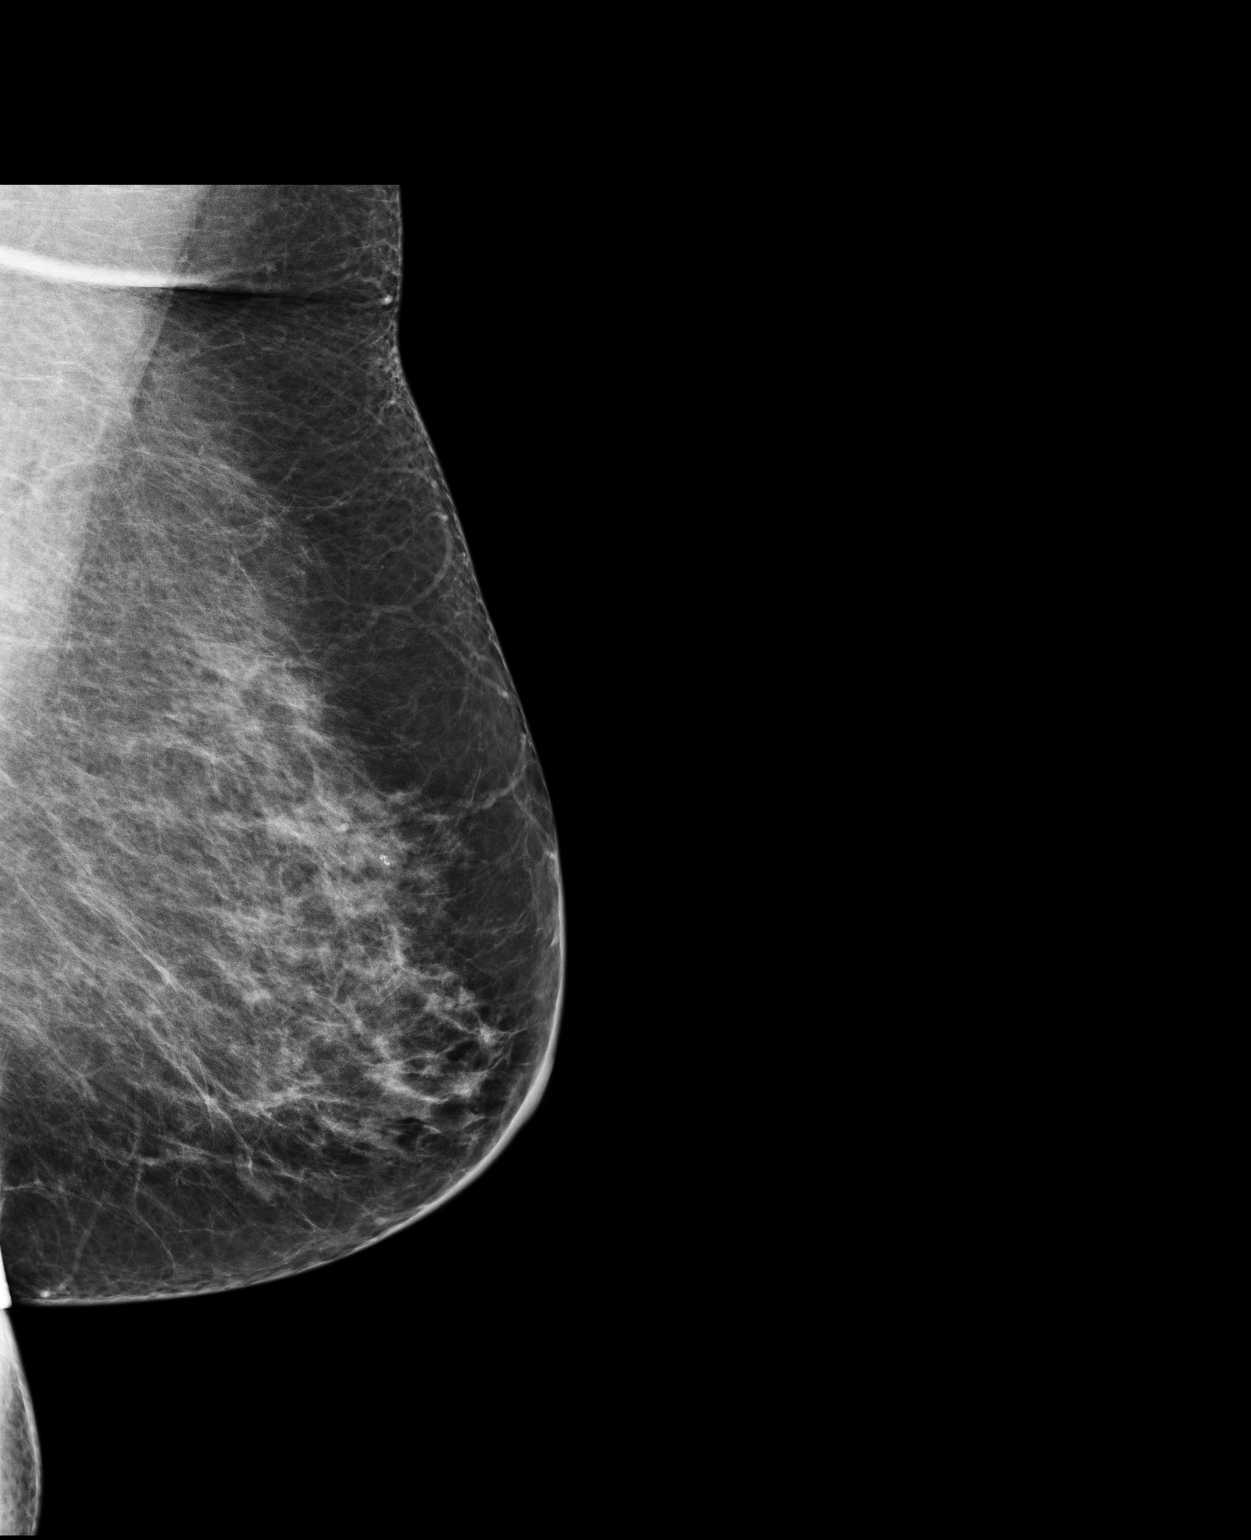

[R CC]
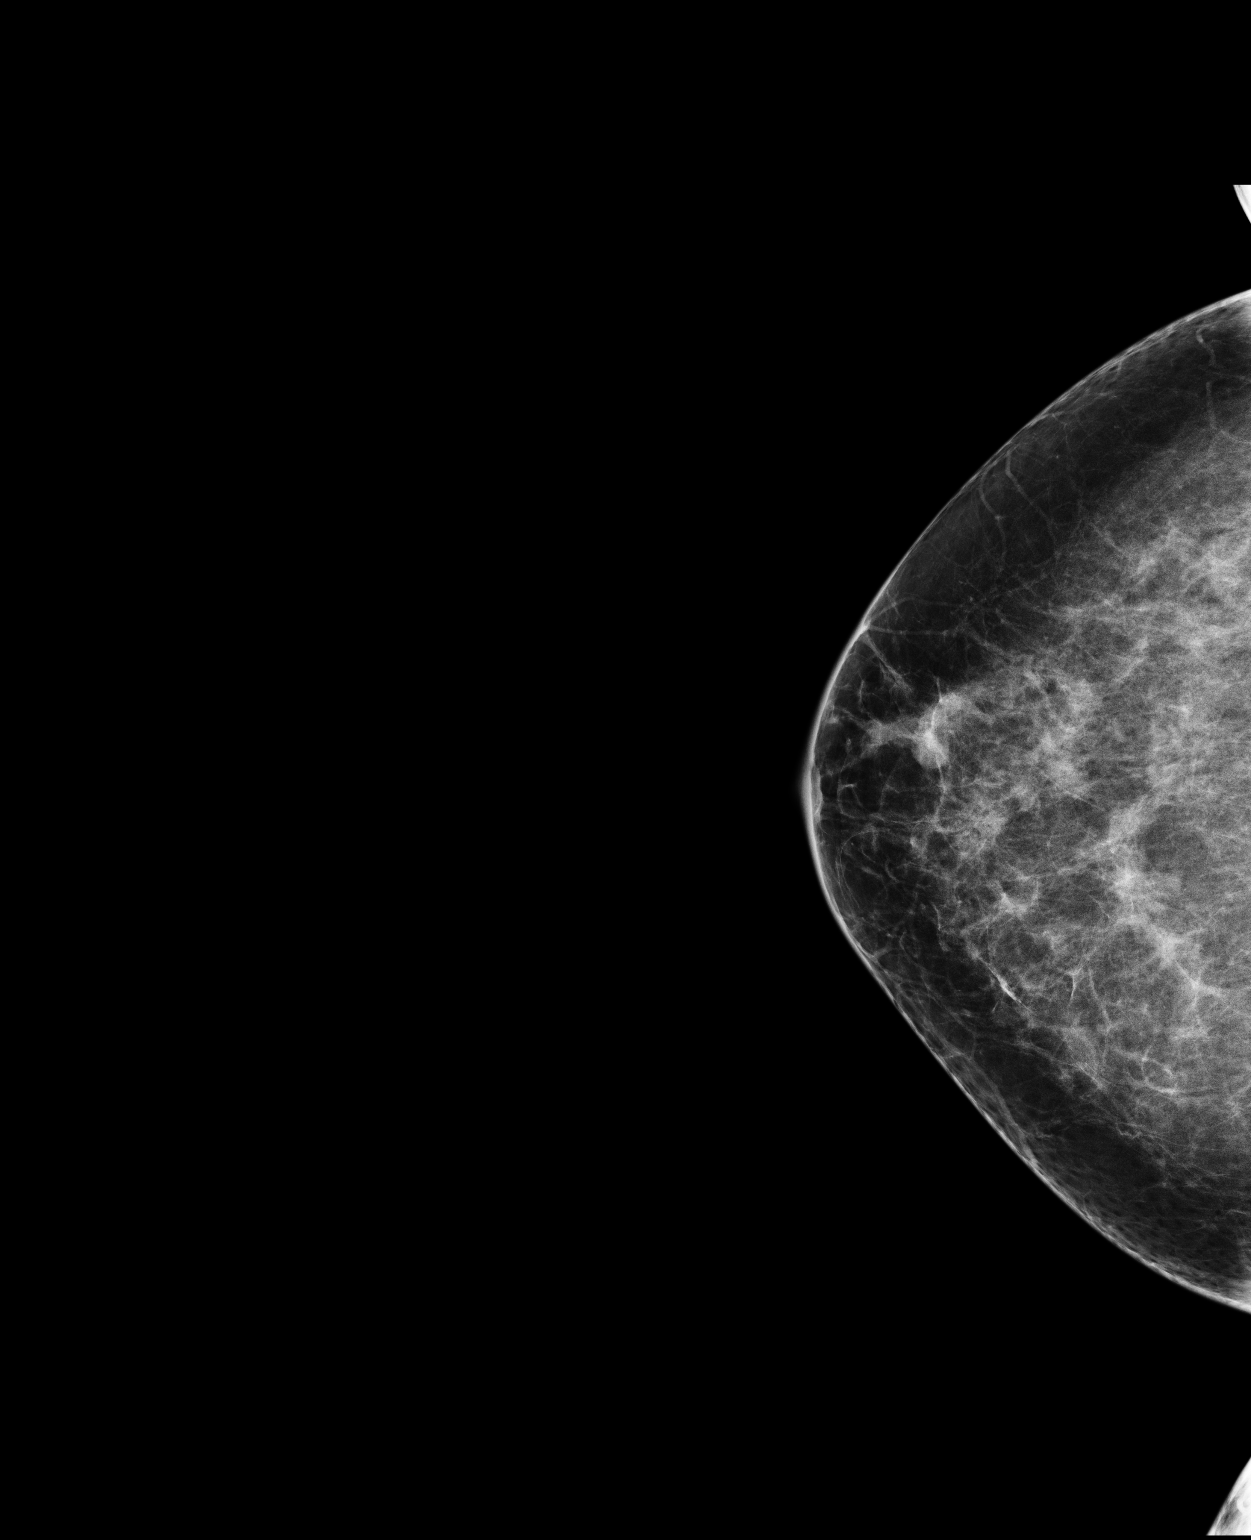

[3 of 3 positions shown; findings below may reference images not displayed]

FINDINGS: ACR Breast Density Category 2: There is a scattered fibroglandular
pattern.

No suspicious masses, architectural distortion, or calcifications
are present.

Images were processed with CAD.
IMPRESSION: No mammographic evidence of malignancy.

A result letter of this screening mammogram will be mailed directly
to the patient.

RECOMMENDATION:
Screening mammogram in one year. (Code:D2-M-8E0)

BI-RADS CATEGORY 2:  Benign finding(s).

## 2013-07-06 DIAGNOSIS — C50919 Malignant neoplasm of unspecified site of unspecified female breast: Secondary | ICD-10-CM | POA: Insufficient documentation

## 2013-07-06 DIAGNOSIS — C801 Malignant (primary) neoplasm, unspecified: Secondary | ICD-10-CM

## 2013-07-06 HISTORY — DX: Malignant (primary) neoplasm, unspecified: C80.1

## 2013-08-08 ENCOUNTER — Telehealth: Payer: Self-pay | Admitting: Internal Medicine

## 2013-08-08 ENCOUNTER — Encounter: Payer: Self-pay | Admitting: Gastroenterology

## 2013-08-08 ENCOUNTER — Ambulatory Visit (INDEPENDENT_AMBULATORY_CARE_PROVIDER_SITE_OTHER): Payer: 59 | Admitting: Gastroenterology

## 2013-08-08 VITALS — BP 144/82 | HR 86 | Ht 68.5 in | Wt 217.0 lb

## 2013-08-08 DIAGNOSIS — K625 Hemorrhage of anus and rectum: Secondary | ICD-10-CM | POA: Insufficient documentation

## 2013-08-08 DIAGNOSIS — K649 Unspecified hemorrhoids: Secondary | ICD-10-CM | POA: Insufficient documentation

## 2013-08-08 DIAGNOSIS — K5730 Diverticulosis of large intestine without perforation or abscess without bleeding: Secondary | ICD-10-CM

## 2013-08-08 MED ORDER — HYDROCORTISONE ACETATE 25 MG RE SUPP: 25.0000 mg | Freq: Every evening | RECTAL | Status: DC | PRN

## 2013-08-08 NOTE — Progress Notes (Signed)
08/08/2013 April Walker [id number] September 18, 1950   History of Present Illness:  Patient is a pleasant 63 year old female who is known to Dr. Carlean Purl for previous colonoscopy in 12/2010.  At that time she had one polyp removed that was a tubular adenoma, but also had severe diverticulosis in the sigmoid colon and internal hemorrhoids.  It was recommended that she have a repeat colonoscopy in 5 yrs from that time.  She comes in today complaining of rectal bleeding.  She says that last Monday she started having diarrhea, nausea, abdominal pains and fever.  This lasted throughout the week and she contributed it to "the stomach flu".  She had been feeling better for a few days, but then on Sunday she developed lower abdominal cramping and then passed a lot of gas in the toilet with blood and a clot.  She had another episode of bleeding later that day.  Also, yesterday she had two bowel movements that each had some blood, but it seemed to be lessening each time.  She had a bowel movement this morning that was loose, but there was not any blood present.  Overall she is feeling well.  Lower abdomen is still slightly sore.  Current Medications, Allergies, Past Medical History, Past Surgical History, Family History and Social History were reviewed in [employer] record.   Physical Exam: BP 144/82  Pulse 86  Ht 5' 8.5" (1.74 m)  Wt 217 lb (98.431 kg)  BMI 32.51 kg/m2 General: Well developed white female in no acute distress Head: Normocephalic and atraumatic Eyes:  Sclerae anicteric, conjunctiva pink  Ears: Normal auditory acuity Lungs: Clear throughout to auscultation Heart: Regular rate and rhythm Abdomen: Soft, non-distended.  Normal bowel sounds.  Mild lower abdominal TTP without R/R/G. Rectal:  External hemorrhoids noted.  DRE did not reveal any masses.  There was no stool or blood on exam glove.  Hemoccult test was negative.   Musculoskeletal: Symmetrical with no gross  deformities  Extremities: No edema  Neurological: Alert oriented x 4, grossly non-focal Psychological:  Alert and cooperative. Normal mood and affect  Assessment and Recommendations: -Self limited rectal bleeding:  Likely secondary to hemorrhoids or possibly low grade diverticular bleed.  Last colonoscopy 12/2010.  Bleeding has stopped at this time.  Will treat hemorrhoids empirically with hydrocortisone suppositories at bedtime for 10 days.  Will call back for recurrent/worsened bleeding.

## 2013-08-08 NOTE — Telephone Encounter (Signed)
Patient with lower abdominal pain and rectal bleeding over the weekend.  She will come in and see Alonza Bogus, PA at 1:30 today

## 2013-08-08 NOTE — Patient Instructions (Signed)
We have sent the following medications to your pharmacy for you to pick up at your convenience: Hydrocortisone suppositories.  Follow up with Dr. Carlean Purl as needed  Please call our office if bleeding continues .                                               We are excited to introduce MyChart, a new best-in-class service that provides you online access to important information in your electronic medical record. We want to make it easier for you to view your health information - all in one secure location - when and where you need it. We expect MyChart will enhance the quality of care and service we provide.  When you register for MyChart, you can:    View your test results.    Request appointments and receive appointment reminders via email.    Request medication renewals.    View your medical history, allergies, medications and immunizations.    Communicate with your physician's office through a password-protected site.    Conveniently print information such as your medication lists.  To find out if MyChart is right for you, please talk to a member of our clinical staff today. We will gladly answer your questions about this free health and wellness tool.  If you are age 65 or older and want a member of your family to have access to your record, you must provide written consent by completing a proxy form available at our office. Please speak to our clinical staff about guidelines regarding accounts for patients younger than age 56.  As you activate your MyChart account and need any technical assistance, please call the MyChart technical support line at [phone]) or email your question to [email or web address]. If you email your question(s), please include your name, a return phone number and the best time to reach you.  If you have non-urgent health-related questions, you can send a message to our office through [place] at [email or web address]. If you have a  medical emergency, call 911.  Thank you for using MyChart as your new health and wellness resource!   MyChart licensed from [employer],  1999-2010. Patents Pending.

## 2013-08-09 NOTE — Progress Notes (Signed)
Agree with management by Ms. Myrtice Lauth.

## 2013-09-07 ENCOUNTER — Other Ambulatory Visit (HOSPITAL_COMMUNITY): Payer: Self-pay | Admitting: Obstetrics & Gynecology

## 2013-09-07 DIAGNOSIS — Z1231 Encounter for screening mammogram for malignant neoplasm of breast: Secondary | ICD-10-CM

## 2013-09-14 ENCOUNTER — Ambulatory Visit (HOSPITAL_COMMUNITY)
Admission: RE | Admit: 2013-09-14 | Discharge: 2013-09-14 | Disposition: A | Discharge status: Home / Self Care | Payer: 59 | Source: Ambulatory Visit | Attending: Obstetrics & Gynecology | Admitting: Obstetrics & Gynecology

## 2013-09-14 DIAGNOSIS — Z1231 Encounter for screening mammogram for malignant neoplasm of breast: Secondary | ICD-10-CM | POA: Insufficient documentation

## 2013-09-18 ENCOUNTER — Other Ambulatory Visit: Payer: Self-pay | Admitting: Obstetrics & Gynecology

## 2013-09-18 DIAGNOSIS — R928 Other abnormal and inconclusive findings on diagnostic imaging of breast: Secondary | ICD-10-CM

## 2013-10-04 ENCOUNTER — Encounter (HOSPITAL_COMMUNITY): Payer: 59

## 2013-10-11 ENCOUNTER — Ambulatory Visit (HOSPITAL_COMMUNITY)
Admission: RE | Admit: 2013-10-11 | Discharge: 2013-10-11 | Disposition: A | Discharge status: Home / Self Care | Payer: 59 | Source: Ambulatory Visit | Attending: Obstetrics & Gynecology | Admitting: Obstetrics & Gynecology

## 2013-10-11 ENCOUNTER — Other Ambulatory Visit: Payer: Self-pay | Admitting: Obstetrics & Gynecology

## 2013-10-11 ENCOUNTER — Encounter (HOSPITAL_COMMUNITY): Payer: Self-pay

## 2013-10-11 VITALS — BP 131/83 | HR 83 | Temp 97.6°F | Resp 16

## 2013-10-11 DIAGNOSIS — R928 Other abnormal and inconclusive findings on diagnostic imaging of breast: Secondary | ICD-10-CM

## 2013-10-11 DIAGNOSIS — C50919 Malignant neoplasm of unspecified site of unspecified female breast: Secondary | ICD-10-CM | POA: Insufficient documentation

## 2013-10-11 DIAGNOSIS — N63 Unspecified lump in unspecified breast: Secondary | ICD-10-CM | POA: Insufficient documentation

## 2013-10-11 MED ORDER — LIDOCAINE HCL (PF) 2 % IJ SOLN
10.0000 mL | Freq: Once | INTRAMUSCULAR | Status: AC
  Administered 2013-10-11: 10 mL

## 2013-10-11 MED ORDER — LIDOCAINE HCL (PF) 2 % IJ SOLN
INTRAMUSCULAR | Status: AC
  Filled 2013-10-11: qty 10

## 2013-10-11 NOTE — Discharge Instructions (Signed)
Breast Biopsy °Care After °These instructions give you information on caring for yourself after your procedure. Your doctor may also give you more specific instructions. Call your doctor if you have any problems or questions after your procedure. °HOME CARE °· Only take medicine as told by your doctor. °· Do not take aspirin. °· Keep your sutures (stitches) dry when bathing. °· Protect the biopsy area. Do not let the area get bumped. °· Avoid activities that could pull the biopsy site open until your doctor approves. This includes: °· Stretching. °· Reaching. °· Exercise. °· Sports. °· Lifting more than 3lb. °· Continue your normal diet. °· Wear a good support bra for as long as told by your doctor. °· Change any bandages (dressings) as told by your doctor. °· Do not drink alcohol while taking pain medicine. °· Keep all doctor visits as told. Ask when your test results will be ready. Make sure you get your test results. °GET HELP RIGHT AWAY IF:  °· You have a fever. °· You have more bleeding (more than a small spot) from the biopsy site. °· You have trouble breathing. °· You have yellowish-white fluid (pus) coming from the biopsy site. °· You have redness, puffiness (swelling), or more pain in the biopsy site. °· You have a bad smell coming from the biopsy site. °· Your biopsy site opens after sutures, staples, or sticky strips have been removed. °· You have a rash. °· You need stronger medicine. °MAKE SURE YOU: °· Understand these instructions. °· Will watch your condition. °· Will get help right away if you are not doing well or get worse. °Document Released: 04/18/2009 Document Revised: 09/14/2011 Document Reviewed: 08/02/2011 °[employer]® Patient Information ©2014 [employer]. ° °

## 2013-10-17 ENCOUNTER — Other Ambulatory Visit: Payer: Self-pay | Admitting: Obstetrics & Gynecology

## 2013-10-17 DIAGNOSIS — C50912 Malignant neoplasm of unspecified site of left female breast: Secondary | ICD-10-CM

## 2013-10-20 ENCOUNTER — Telehealth: Payer: Self-pay | Admitting: *Deleted

## 2013-10-20 NOTE — Telephone Encounter (Signed)
Left message for a return phone call to schedule for [facility] 11/01/13.  Awaiting patient response.

## 2013-10-23 ENCOUNTER — Ambulatory Visit (HOSPITAL_COMMUNITY)
Admission: RE | Admit: 2013-10-23 | Discharge: 2013-10-23 | Disposition: A | Discharge status: Home / Self Care | Payer: 59 | Source: Ambulatory Visit | Attending: Obstetrics & Gynecology | Admitting: Obstetrics & Gynecology

## 2013-10-23 ENCOUNTER — Telehealth: Payer: Self-pay | Admitting: *Deleted

## 2013-10-23 DIAGNOSIS — C50912 Malignant neoplasm of unspecified site of left female breast: Secondary | ICD-10-CM

## 2013-10-23 DIAGNOSIS — C50419 Malignant neoplasm of upper-outer quadrant of unspecified female breast: Secondary | ICD-10-CM | POA: Insufficient documentation

## 2013-10-23 MED ORDER — GADOBENATE DIMEGLUMINE 529 MG/ML IV SOLN
20.0000 mL | Freq: Once | INTRAVENOUS | Status: AC | PRN
  Administered 2013-10-23: 20 mL via INTRAVENOUS

## 2013-10-23 NOTE — Telephone Encounter (Signed)
Left message for a return phone call to schedule patient for [facility] 11/01/13.

## 2013-10-26 ENCOUNTER — Other Ambulatory Visit: Payer: Self-pay | Admitting: Obstetrics & Gynecology

## 2013-10-26 DIAGNOSIS — R928 Other abnormal and inconclusive findings on diagnostic imaging of breast: Secondary | ICD-10-CM

## 2013-10-30 ENCOUNTER — Ambulatory Visit
Admission: RE | Admit: 2013-10-30 | Discharge: 2013-10-30 | Disposition: A | Discharge status: Home / Self Care | Payer: 59 | Source: Ambulatory Visit | Attending: Obstetrics & Gynecology | Admitting: Obstetrics & Gynecology

## 2013-10-30 ENCOUNTER — Other Ambulatory Visit: Payer: Self-pay | Admitting: Obstetrics & Gynecology

## 2013-10-30 ENCOUNTER — Telehealth: Payer: Self-pay | Admitting: *Deleted

## 2013-10-30 DIAGNOSIS — R928 Other abnormal and inconclusive findings on diagnostic imaging of breast: Secondary | ICD-10-CM

## 2013-10-30 DIAGNOSIS — C50412 Malignant neoplasm of upper-outer quadrant of left female breast: Secondary | ICD-10-CM

## 2013-10-30 DIAGNOSIS — Z170 Estrogen receptor positive status [ER+]: Secondary | ICD-10-CM | POA: Insufficient documentation

## 2013-10-30 NOTE — Telephone Encounter (Signed)
Received call back from patient and confirmed her [place] appt. For 11/01/13 at 8am. Patient states she had labs drawn at her PCP on 10/11/13 and will have them faxed here.  Instructions and contact information given.

## 2013-11-01 ENCOUNTER — Ambulatory Visit (HOSPITAL_BASED_OUTPATIENT_CLINIC_OR_DEPARTMENT_OTHER): Payer: 59 | Admitting: Oncology

## 2013-11-01 ENCOUNTER — Ambulatory Visit: Payer: 59 | Attending: General Surgery | Admitting: Physical Therapy

## 2013-11-01 ENCOUNTER — Encounter: Payer: Self-pay | Admitting: Oncology

## 2013-11-01 ENCOUNTER — Encounter: Payer: 59 | Admitting: Oncology

## 2013-11-01 ENCOUNTER — Ambulatory Visit (HOSPITAL_BASED_OUTPATIENT_CLINIC_OR_DEPARTMENT_OTHER): Payer: Commercial Managed Care - PPO | Admitting: General Surgery

## 2013-11-01 ENCOUNTER — Ambulatory Visit
Admission: RE | Admit: 2013-11-01 | Discharge: 2013-11-01 | Disposition: A | Discharge status: Home / Self Care | Payer: 59 | Source: Ambulatory Visit | Attending: Radiation Oncology | Admitting: Radiation Oncology

## 2013-11-01 ENCOUNTER — Telehealth: Payer: Self-pay | Admitting: Oncology

## 2013-11-01 ENCOUNTER — Telehealth: Payer: Self-pay | Admitting: *Deleted

## 2013-11-01 ENCOUNTER — Ambulatory Visit: Payer: 59

## 2013-11-01 VITALS — BP 146/85 | HR 80 | Temp 98.8°F | Resp 20 | Ht 68.5 in | Wt 218.8 lb

## 2013-11-01 DIAGNOSIS — R293 Abnormal posture: Secondary | ICD-10-CM | POA: Insufficient documentation

## 2013-11-01 DIAGNOSIS — C50419 Malignant neoplasm of upper-outer quadrant of unspecified female breast: Secondary | ICD-10-CM

## 2013-11-01 DIAGNOSIS — C50412 Malignant neoplasm of upper-outer quadrant of left female breast: Secondary | ICD-10-CM

## 2013-11-01 DIAGNOSIS — Z170 Estrogen receptor positive status [ER+]: Secondary | ICD-10-CM

## 2013-11-01 DIAGNOSIS — K219 Gastro-esophageal reflux disease without esophagitis: Secondary | ICD-10-CM

## 2013-11-01 DIAGNOSIS — C50919 Malignant neoplasm of unspecified site of unspecified female breast: Secondary | ICD-10-CM | POA: Insufficient documentation

## 2013-11-01 DIAGNOSIS — E785 Hyperlipidemia, unspecified: Secondary | ICD-10-CM

## 2013-11-01 DIAGNOSIS — IMO0001 Reserved for inherently not codable concepts without codable children: Secondary | ICD-10-CM | POA: Insufficient documentation

## 2013-11-01 NOTE — Telephone Encounter (Signed)
per pof to sch appts/sent echo to pre-ert w/linda-adv pt will call with echo appt/MW sch chemo-printed & gave pt sch

## 2013-11-01 NOTE — Progress Notes (Signed)
Checked in new pt with no financial concerns. °

## 2013-11-01 NOTE — Telephone Encounter (Signed)
Per staff message and POF I have scheduled appts.  JMW  

## 2013-11-01 NOTE — Progress Notes (Signed)
Radiation Oncology         [phone]) [phone] ________________________________  Initial outpatient Consultation - Date: 11/01/2013   Name: April Walker MRN: [id number]   DOB: 05-Feb-1951  REFERRING PHYSICIAN: Stark Klein, MD  DIAGNOSIS: No diagnosis found.  STAGE: Breast cancer of upper-outer quadrant of left female breast   Primary site: Breast (Left)   Staging method: AJCC 7th Edition   Clinical: Stage IIA (T2, N0, cM0)   Summary: Stage IIA (T2, N0, cM0)   Clinical comments: Staged at breast conference 11/01/13.   HISTORY OF PRESENT ILLNESS::April Walker is a 63 y.o. female  underwent a screening mammogram and was found to have a left breast mass. On ultrasound this measured 2.8 x 1.9 x 13.0 cm. MRI showed this mass as well as a mass in the inner lower quadrant of the left breast and a positive lymph node. 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 This was ER/PR positive HER-2 positive Ki-67 was 87%. Biopsies of the other 2 areas including the lymph nodes were negative. She is interested in breast conservation. She is present with a friend today. 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 She is GX P0. She took hormone replacement in to 2010. She had menses at 15. She has no personal history of breast cancer. She is adopted so her family history is unknown. She had no symptoms prior to this mass. She was not able to palpate this and had no breast pain.Marland Kitchen  PREVIOUS RADIATION THERAPY: No  PAST MEDICAL HISTORY:  has a past medical history of Abnormal cholesterol test; Hyperlipidemia; Knee pain, left; GERD (gastroesophageal reflux disease); Arthritis; Tubulovillous adenoma of colon; Diverticulosis; and Internal hemorrhoids.    PAST SURGICAL HISTORY: Past Surgical History  Procedure Laterality Date  . Tonsillectomy and adenoidectomy    . Right knee arthroscopy  2011 Dr. Aline Brochure  . Treatment fistula anal    . Upper gastrointestinal  endoscopy    . Hemorrhoid surgery    . Colonoscopy  2002    hemorrhoids  . Colonoscopy w/ polypectomy  12/09/2010    1 cm sigmoid polyp - , diverticulosis, hemorrhoids  . Knee surgery    . Knee surgery  2012    Left knee arthroscopy  . Total knee arthroplasty  11/23/2011    Procedure: TOTAL KNEE ARTHROPLASTY;  Surgeon: Carole Civil, MD;  Location: [facility];  Service: Orthopedics;  Laterality: Left;    FAMILY HISTORY:  Family History  Problem Relation Age of Onset  . Adopted: Yes  . Stroke Mother   . Heart attack Father     SOCIAL HISTORY:  History  Substance Use Topics  . Smoking status: Never Smoker   . Smokeless tobacco: Never Used  . Alcohol Use: Yes     Comment: socially    ALLERGIES: Sulfonamide derivatives and Triprolidine-pse  MEDICATIONS:  Current Outpatient Prescriptions  Medication Sig Dispense Refill  . Acetaminophen (TYLENOL EXTRA STRENGTH PO) Take by mouth.      . hydrocortisone (ANUSOL-HC) 25 MG suppository Place 1 suppository (25 mg total) rectally at bedtime as needed for hemorrhoids or itching.  10 suppository  0  . loratadine (CLARITIN) 10 MG tablet Take 10 mg by mouth daily as needed. For allergies      . rosuvastatin (CRESTOR) 5 MG tablet Take 5 mg by mouth daily.       No current facility-administered medications for this encounter.    REVIEW OF SYSTEMS:  A 15 point review of systems is documented in the electronic medical record. This was obtained by the nursing staff. However, I reviewed this with the patient to discuss relevant findings and make appropriate changes.  A comprehensive review of systems was negative.  PHYSICAL EXAM:  Wasn't female in no distress sitting comfortably examining table. She has a palpable left breast mass. This is at the 3 to 4:00 position. No palpable axillary adenopathy. She has 5 out of 5 strength bilaterally.  LABORATORY DATA:  Lab Results  Component Value Date   WBC 10.0 11/26/2011   HGB 10.3* 11/26/2011   HCT  31.3* 11/26/2011   MCV 81.1 11/26/2011   PLT 199 11/26/2011   Lab Results  Component Value Date   NA 135 11/24/2011   K 3.9 11/24/2011   CL 102 11/24/2011   CO2 25 11/24/2011   No results found for this basename: ALT, AST, GGT, ALKPHOS, BILITOT     RADIOGRAPHY: Mr Breast Bilateral W Wo Contrast  10/24/2013   CLINICAL DATA:  63 year old female with newly diagnosed left breast cancer.  LABS:  Performed at outside institution  EXAM: BILATERAL BREAST MRI WITH AND WITHOUT CONTRAST  TECHNIQUE: Multiplanar, multisequence MR images of both breasts were obtained prior to and following the intravenous administration of 69m of MultiHance.  THREE-DIMENSIONAL MR IMAGE RENDERING ON INDEPENDENT WORKSTATION:  Three-dimensional MR images were rendered by post-processing of the original MR data on an independent workstation. The three-dimensional MR images were interpreted, and findings are reported in the following complete MRI report for this study. Three dimensional images were evaluated at the independent DynaCad workstation  COMPARISON:  Previous exams  FINDINGS: Breast composition: b.  Scattered fibroglandular tissue.  Background parenchymal enhancement: Mild  Right breast: No mass or abnormal enhancement.  Left breast: A 3 cm irregular enhancing mass with rapid washout kinetics is identified within the upper-outer left breast (junction of the middle posterior thirds) and contains biopsy clip artifact -compatible with biopsy-proven neoplasm.  A 5 x 9 mm circumscribed oval mass within the inner lower left breast (anterior third) exhibits plateau type kinetics and is indeterminate. No definite fatty hilum is noted.  Lymph nodes: A 1.4 cm upper left axillary lymph node has an asymmetrically thickened cortex medially (best seen on image 163- 165 on axial contrast images) and suspicious for lymphatic tumor spread.  No other abnormal appearing lymph nodes are identified.  Ancillary findings:  Mild cardiomegaly identified.   IMPRESSION: 3 cm biopsy-proven neoplasm within the upper outer left breast. Asymmetric cortical thickening of up upper left axillary lymph node suspicious for lymphatic tumor spread. Second-look left axillary ultrasound and possible biopsy is recommended.  Indeterminate 5 x 9 mm circumscribed oval mass within the inner lower left breast. Although this could represent an intraparenchymal lymph node or cystic changes, left breast ultrasound and possible biopsy is recommended for further evaluation.  No evidence of contralateral neoplasm.  RECOMMENDATION: Second look left breast and left axillary ultrasound with possible biopsies. This ultrasound will be arranged by or office.  BI-RADS CATEGORY  4: Suspicious.   Electronically Signed   By: JHassan RowanM.D.   On: 10/24/2013 11:34   [place] Biopsy  10/17/2013   CLINICAL DATA:  Left breast mass.  EXAM: ULTRASOUND GUIDED LEFT BREAST CORE NEEDLE BIOPSY WITH VACUUM ASSIST  COMPARISON:  Previous exams.  PROCEDURE: I met with the patient and we discussed the procedure of ultrasound-guided biopsy, including benefits and alternatives. We discussed the high likelihood of a  successful procedure. We discussed the risks of the procedure including infection, bleeding, tissue injury, clip migration, and inadequate sampling. Informed written consent was given. The usual time-out protocol was performed immediately prior to the procedure.  Using sterile technique and 2% Lidocaine as local anesthetic, under direct ultrasound visualization, a 12 gauge vacuum-assisted device was used to perform biopsy of left breast mass at 230, 6 cm from the nipple, using a inferior to superior approach. At the conclusion of the procedure, a ribbon shaped tissue marker clip was deployed into the biopsy cavity. Follow-up 2-view mammogram was performed and dictated separately.  IMPRESSION: Pathology demonstrates high grade invasive ductal carcinoma. These results are concordant. Surgical consultation and MRI  is recommended. We will contact the patient regarding an appointment for a breast MRI. The patient wishes to speak with her OBGYN regarding a surgical referral. The patient was notified of results and recommendations on 10/13/2013. She reports soreness of the biopsy site. Otherwise, she has no complaints.  ADDENDUM: Pathology demonstrates high grade invasive ductal carcinoma. These results are concordant. Surgical consultation and MRI is recommended. We will contact the patient regarding an appointment for a breast MRI. The patient wishes to speak with her OBGYN regarding a surgical referral. The patient was notified of results and recommendations on 10/13/2013. She reports soreness of the biopsy site. Otherwise, she has no complaints.   Electronically Signed   By: Donavan Burnet M.D.   On: 10/17/2013 10:05   Mm Digital Diagnostic Unilat L  10/30/2013   CLINICAL DATA:  Status post ultrasound-guided core needle biopsy of a 7 mm mass in the 7:30 o'clock position of the left breast.  EXAM: POST-BIOPSY CLIP PLACEMENT LEFT DIAGNOSTIC MAMMOGRAM  COMPARISON:  Previous exams.  FINDINGS: Films are performed following ultrasound guided biopsy of a 7 mm mass in the 7:30 o'clock position of the left breast. These demonstrate a coil shaped biopsy marker clip at the expected location of the biopsied mass. There is also a ribbon shaped clip within the previously biopsied invasive ductal carcinoma in the upper outer left breast. The clips are located 7 cm apart.  IMPRESSION: Appropriate clip deployment following left breast ultrasound-guided core needle biopsy.  Final Assessment: Post Procedure Mammograms for Marker Placement   Electronically Signed   By: Enrique Sack M.D.   On: 10/30/2013 14:42   Mm Digital Diagnostic Unilat L  10/11/2013   CLINICAL DATA:  Abnormal screening mammogram.  EXAM: DIGITAL DIAGNOSTIC  LEFT MAMMOGRAM WITH CAD  ULTRASOUND LEFT BREAST  COMPARISON:  Multiple priors  ACR Breast Density Category b:  There are scattered areas of fibroglandular density.  FINDINGS: An irregular mass is identified in the upper-outer left breast, measuring up to 3 cm.  Mammogram following an ultrasound-guided biopsy of the left breast mass at 230, 6 cm from the nipple, demonstrates satisfactory placement of a ribbon shaped clip.  Mammographic images were processed with CAD.  On physical exam, there is a firm fixed mass at 230, 6 cm from the nipple.  Targeted ultrasound demonstrates an irregular hypoechoic mass with mild posterior acoustic shadowing at 230, 6 cm from the nipple. It measures 2.8 x 1.9 x 3 cm. Evaluation of the left axilla is negative for adenopathy.  IMPRESSION: 1. Suspicious left breast mass. 2. Satisfactory clip placement following an ultrasound-guided biopsy.  RECOMMENDATION: Ultrasound-guided biopsy of the left breast mass. This was performed the same date as the patient's diagnostic examination.  I have discussed the findings and recommendations with the patient. Results were  also provided in writing at the conclusion of the visit. If applicable, a reminder letter will be sent to the patient regarding the next appointment.  BI-RADS CATEGORY  5: Highly suggestive of malignancy - appropriate action should be taken.   Electronically Signed   By: Donavan Burnet M.D.   On: 10/11/2013 15:34   US Breast Ltd Uni Left Inc Axilla  10/30/2013   CLINICAL DATA:  Recently diagnosed left breast upper outer quadrant invasive ductal carcinoma. Additional 9 mm mass seen in the lower inner quadrant of the left breast on a recent staging MRI. There was also a mildly abnormal appearing left axillary lymph node on the MRI.  EXAM: ULTRASOUND OF THE LEFT BREAST  COMPARISON:  Previous examinations, including the bilateral breast MRI dated 10/23/2013.  FINDINGS: On physical exam,no mass is palpable in the left axilla or lower inner left breast.  Ultrasound is performed, showing a left axillary lymph node with mild asymmetrical  cortical thickening. There is also 7 x 6 x 4 mm mildly irregular, hypoechoic, obliquely oriented mass in the 7:30 o'clock position of the left breast, 4 cm from the nipple. This is in the region of the 9 mm mass seen on the MRI.  IMPRESSION: 7 mm indeterminate mass in the 7:30 o'clock position of the left breast and mildly abnormal appearing left axillary lymph node. Ultrasound-guided core needle biopsies of the mass and lymph node are recommended and scheduled to follow.  RECOMMENDATION: Left breast and left axillary lymph node ultrasound-guided core needle biopsies (scheduled follow).  I have discussed the findings and recommendations with the patient. Results were also provided in writing at the conclusion of the visit. If applicable, a reminder letter will be sent to the patient regarding the next appointment.  BI-RADS CATEGORY  4: Suspicious.   Electronically Signed   By: Enrique Sack M.D.   On: 10/30/2013 14:31   US Breast Ltd Uni Left Inc Axilla  10/11/2013   CLINICAL DATA:  Abnormal screening mammogram.  EXAM: DIGITAL DIAGNOSTIC  LEFT MAMMOGRAM WITH CAD  ULTRASOUND LEFT BREAST  COMPARISON:  Multiple priors  ACR Breast Density Category b: There are scattered areas of fibroglandular density.  FINDINGS: An irregular mass is identified in the upper-outer left breast, measuring up to 3 cm.  Mammogram following an ultrasound-guided biopsy of the left breast mass at 230, 6 cm from the nipple, demonstrates satisfactory placement of a ribbon shaped clip.  Mammographic images were processed with CAD.  On physical exam, there is a firm fixed mass at 230, 6 cm from the nipple.  Targeted ultrasound demonstrates an irregular hypoechoic mass with mild posterior acoustic shadowing at 230, 6 cm from the nipple. It measures 2.8 x 1.9 x 3 cm. Evaluation of the left axilla is negative for adenopathy.  IMPRESSION: 1. Suspicious left breast mass. 2. Satisfactory clip placement following an ultrasound-guided biopsy.   RECOMMENDATION: Ultrasound-guided biopsy of the left breast mass. This was performed the same date as the patient's diagnostic examination.  I have discussed the findings and recommendations with the patient. Results were also provided in writing at the conclusion of the visit. If applicable, a reminder letter will be sent to the patient regarding the next appointment.  BI-RADS CATEGORY  5: Highly suggestive of malignancy - appropriate action should be taken.   Electronically Signed   By: Donavan Burnet M.D.   On: 10/11/2013 15:34   [place] Lt Breast Bx W Loc Dev 1st Lesion Img Bx Spec US Guide  11/01/2013   ADDENDUM REPORT: 11/01/2013 08:23  ADDENDUM: The final pathological diagnosis is benign breast tissue and focal fibrocystic changes. This is concordant with the imaging findings. The final pathological diagnosis was discussed with the patient by telephone on 10/31/2013. Her questions were answered. She reported a small amount of pain and bruising at the biopsy site with no palpable hematoma.   Electronically Signed   By: Enrique Sack M.D.   On: 11/01/2013 08:23   11/01/2013   CLINICAL DATA:  7 mm of the indeterminate mass in the 7:30 o'clock position of the left breast at recent ultrasound and MRI.  EXAM: ULTRASOUND GUIDED LEFT BREAST CORE NEEDLE BIOPSY  COMPARISON:  Previous exams.  PROCEDURE: I met with the patient and we discussed the procedure of ultrasound-guided biopsy, including benefits and alternatives. We discussed the high likelihood of a successful procedure. We discussed the risks of the procedure including infection, bleeding, tissue injury, clip migration, and inadequate sampling. Informed written consent was given. The usual time-out protocol was performed immediately prior to the procedure.  Using sterile technique and 2% Lidocaine as local anesthetic, under direct ultrasound visualization, a 12 gauge vacuum-assisteddevice was used to perform biopsy of the recently demonstrated 7 mm mass in  the 7:30 o'clock position of the left breast, 4 cm from the nipple, using a lateral approach. At the conclusion of the procedure, a coil shaped tissue marker clip was deployed into the biopsy cavity. Follow-up 2-view mammogram was performed and dictated separately.  IMPRESSION: Ultrasound-guided biopsy of a 7 mm mass in the 7:30 o'clock position of the left breast. No apparent complications.  Electronically Signed: By: Enrique Sack M.D. On: 10/30/2013 14:32   [place] Lt Breast Bx W Loc Dev 1st Lesion Img Bx Spec US Guide  10/13/2013   ADDENDUM REPORT: 10/13/2013 12:35  ADDENDUM: Pathology demonstrates high grade invasive ductal carcinoma. These results are concordant. Surgical consultation and MRI is recommended. We will contact the patient regarding an appointment for a breast MRI. The patient wishes to speak with her OBGYN regarding a surgical referral. The patient was notified of results and recommendations on 10/13/2013. She reports soreness of the biopsy site. Otherwise, she has no complaints.   Electronically Signed   By: Donavan Burnet M.D.   On: 10/13/2013 12:35   10/13/2013   CLINICAL DATA:  Left breast mass.  EXAM: ULTRASOUND GUIDED LEFT BREAST CORE NEEDLE BIOPSY WITH VACUUM ASSIST  COMPARISON:  Previous exams.  PROCEDURE: I met with the patient and we discussed the procedure of ultrasound-guided biopsy, including benefits and alternatives. We discussed the high likelihood of a successful procedure. We discussed the risks of the procedure including infection, bleeding, tissue injury, clip migration, and inadequate sampling. Informed written consent was given. The usual time-out protocol was performed immediately prior to the procedure.  Using sterile technique and 2% Lidocaine as local anesthetic, under direct ultrasound visualization, a 12 gauge vacuum-assisteddevice was used to perform biopsy of left breast mass at 230, 6 cm from the nipple, using a inferior to superior approach. At the conclusion of  the procedure, a ribbon shaped tissue marker clip was deployed into the biopsy cavity. Follow-up 2-view mammogram was performed and dictated separately.  IMPRESSION: Ultrasound-guided biopsy of a left breast mass. No apparent complications.  Electronically Signed: By: Donavan Burnet M.D. On: 10/11/2013 15:35   [place] Lt Breast Bx W Loc Dev Ea Add Lesion Img Bx Spec US Guide  11/01/2013   ADDENDUM REPORT: 11/01/2013 08:24  ADDENDUM: The  final pathological diagnosis is benign lymph node. This is concordant with the imaging findings. The final pathological diagnosis was discussed with the patient by telephone on 10/31/2013. Her questions were answered. She reported a small amount of pain and bruising at the biopsy site with no palpable hematoma.   Electronically Signed   By: Enrique Sack M.D.   On: 11/01/2013 08:24   11/01/2013   CLINICAL DATA:  Mildly abnormal appearing left axillary lymph node at recent MRI and ultrasound. Recently diagnosed invasive ductal carcinoma in the upper-outer quadrant of the left breast.  EXAM: ULTRASOUND GUIDED CORE NEEDLE BIOPSY OF A LEFT AXILLARY NODE  COMPARISON:  Previous exams.  FINDINGS: I met with the patient and we discussed the procedure of ultrasound-guided biopsy, including benefits and alternatives. We discussed the high likelihood of a successful procedure. We discussed the risks of the procedure, including infection, bleeding, tissue injury, clip migration, and inadequate sampling. Informed written consent was given. The usual time-out protocol was performed immediately prior to the procedure.  Using sterile technique and 2% Lidocaine as local anesthetic, under direct ultrasound visualization, a 14 gauge spring-loaded device was used to perform biopsy of the recently demonstrated mildly abnormal appearing left axillary lymph node using an inferior approach.  IMPRESSION: Ultrasound guided biopsy of a mildly abnormal appearing left axillary lymph node. No apparent  complications.  Electronically Signed: By: Enrique Sack M.D. On: 10/30/2013 14:34   IMPRESSION: T2N0 Invasive Ductal Carcinoma of the Left Breast  PLAN: I spoke to Ms. Jeani Hawking deferred today about the role of radiation. We discussed that neoadjuvant chemotherapy was given to hopefully make her a candidate for breast conservation. We discussed that even if she had a pathologic complete response our recommendations would still be for radiation to the whole breast with a tumor bed boost. We discussed the process of simulation the placement tattoos. We discussed 6 weeks of treatment as an outpatient. She had skin redness and fatigue. There is a low risk of symptomatic lung or heart damage with the use of breath hold technique.  I spent 60 minutes  face to face with the patient and more than 50% of that time was spent in counseling and/or coordination of care.   ------------------------------------------------  [name], MD

## 2013-11-01 NOTE — Progress Notes (Addendum)
Chief complaint:  New left breast cancer   HISTORY: April Walker is seen in consultation at the request of Dr. Nyoka Cowden.  This is a 63 for a female who presented with a screening detected mass in the left breast. This was around 3 cm.  She underwent an MRI which also showed an approximately 3 cm mass in the same position. There was an additional region in the left breast and a lymph node that was suspicious. These were both biopsied, and these were benign. 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 HER-2 ratio is 2.7. Ki-67 was 87%. She was not to palpate the mass before this. She has been up-to-date on her previous mammograms.  She had menarche at age 54. She is postmenopausal. She did use hormone contraception for a while but stopped in 2010. She has not had any children. She has had a colonoscopy in 2012. She is up-to-date on her Pap smear. She is a nonsmoker. She does not know any of her family history as she is adopted. She denies any other problems with her breasts.    Past Medical History  Diagnosis Date  . Abnormal cholesterol test   . Hyperlipidemia   . Knee pain, left   . GERD (gastroesophageal reflux disease)   . Arthritis   . Tubulovillous adenoma of colon   . Diverticulosis   . Internal hemorrhoids     Past Surgical History  Procedure Laterality Date  . Tonsillectomy and adenoidectomy    . Right knee arthroscopy  2011 Dr. Aline Brochure  . Treatment fistula anal    . Upper gastrointestinal endoscopy    . Hemorrhoid surgery    . Colonoscopy  2002    hemorrhoids  . Colonoscopy w/ polypectomy  12/09/2010    1 cm sigmoid polyp - , diverticulosis, hemorrhoids  . Knee surgery    . Knee surgery  2012    Left knee arthroscopy  . Total knee arthroplasty  11/23/2011    Procedure: TOTAL KNEE ARTHROPLASTY;  Surgeon: Carole Civil, MD;  Location: [facility];  Service: Orthopedics;  Laterality:  Left;    Current Outpatient Prescriptions  Medication Sig Dispense Refill  . Acetaminophen (TYLENOL EXTRA STRENGTH PO) Take by mouth.      . hydrocortisone (ANUSOL-HC) 25 MG suppository Place 1 suppository (25 mg total) rectally at bedtime as needed for hemorrhoids or itching.  10 suppository  0  . loratadine (CLARITIN) 10 MG tablet Take 10 mg by mouth daily as needed. For allergies      . rosuvastatin (CRESTOR) 5 MG tablet Take 5 mg by mouth daily.       No current facility-administered medications for this visit.     Allergies  Allergen Reactions  . Sulfonamide Derivatives Anaphylaxis and Hives    Pt. Stated, "large welps on neck immediately."  . Triprolidine-Pse Other (See Comments)    This is Actifed. Increased heart rate to tachycardia.     Family History  Problem Relation Age of Onset  . Adopted: Yes  . Stroke Mother   . Heart attack Father      History   Social History  . Marital Status: Single    Spouse Name: N/A    Number of Children: N/A  . Years of Education: college   Occupational History  . echo tech    Social History Main Topics  . Smoking status: Never Smoker   .  Smokeless tobacco: Never Used  . Alcohol Use: Yes     Comment: socially  . Drug Use: No  . Sexual Activity: Not on file   Other Topics Concern  . Not on file   Social History Narrative  . No narrative on file     REVIEW OF SYSTEMS - PERTINENT POSITIVES ONLY: 12 point review of systems negative other than HPI and PMH except for hamstring pain, fatigue, hearing loss, hemorrhoids  EXAM: Wt Readings from Last 3 Encounters:  11/01/13 218 lb 12.8 oz (99.247 kg)  08/08/13 217 lb (98.431 kg)  12/15/12 215 lb (97.523 kg)   Temp Readings from Last 3 Encounters:  11/01/13 98.8 F (37.1 C) Oral  10/11/13 97.6 F (36.4 C) Oral  11/26/11 98.1 F (36.7 C) Oral   BP Readings from Last 3 Encounters:  11/01/13 146/85  10/11/13 131/83  08/08/13 144/82   Pulse Readings from Last 3  Encounters:  11/01/13 80  10/11/13 83  08/08/13 86     Wt Readings from Last 3 Encounters:  11/01/13 218 lb 12.8 oz (99.247 kg)  08/08/13 217 lb (98.431 kg)  12/15/12 215 lb (97.523 kg)     Gen:  No acute distress.  Well nourished and well groomed.   Neurological: Alert and oriented to person, place, and time. Coordination normal.  Head: Normocephalic and atraumatic.  Eyes: Conjunctivae are normal. Pupils are equal, round, and reactive to light. No scleral icterus.  Neck: Normal range of motion. Neck supple. No tracheal deviation or thyromegaly present.  Cardiovascular: Normal rate, regular rhythm, normal heart sounds and intact distal pulses.  Exam reveals no gallop and no friction rub.  No murmur heard. Breast: palpable mass at 3 o'clock on left.  1 cm palpable LN.  No nipple retraction or skin dimpling.  No other masses.  Relatively dense breasts.  No nipple retraction.   Respiratory: Effort normal.  No respiratory distress. No chest wall tenderness. Breath sounds normal.  No wheezes, rales or rhonchi.  GI: Soft. Bowel sounds are normal. The abdomen is soft and nontender.  There is no rebound and no guarding.  Musculoskeletal: Normal range of motion. Extremities are nontender.  Lymphadenopathy: No cervical, preauricular, postauricular or axillary adenopathy is present Skin: Skin is warm and dry. No rash noted. No diaphoresis. No erythema. No pallor. No clubbing, cyanosis, or edema.   Psychiatric: Normal mood and affect. Behavior is normal. Judgment and thought content normal.    LABORATORY RESULTS: Available labs are reviewed   Pathology Diagnosis Breast, left, needle core biopsy, 2:30 - INVASIVE DUCTAL CARCINOMA. - PLEASE SEE COMMENT.  RADIOLOGY RESULTS: See E-Chart or I-Site for most recent results.  Images and reports are reviewed.  Mr Breast Bilateral W Wo Contrast  10/24/2013   CLINICAL DATA:  63 year old female with newly diagnosed left breast cancer.  LABS:   Performed at outside institution  EXAM: BILATERAL BREAST MRI WITH AND WITHOUT CONTRAST  TECHNIQUE: Multiplanar, multisequence MR images of both breasts were obtained prior to and following the intravenous administration of 31m of MultiHance.  THREE-DIMENSIONAL MR IMAGE RENDERING ON INDEPENDENT WORKSTATION:  Three-dimensional MR images were rendered by post-processing of the original MR data on an independent workstation. The three-dimensional MR images were interpreted, and findings are reported in the following complete MRI report for this study. Three dimensional images were evaluated at the independent DynaCad workstation  COMPARISON:  Previous exams  FINDINGS: Breast composition: b.  Scattered fibroglandular tissue.  Background parenchymal enhancement: Mild  Right breast:  No mass or abnormal enhancement.  Left breast: A 3 cm irregular enhancing mass with rapid washout kinetics is identified within the upper-outer left breast (junction of the middle posterior thirds) and contains biopsy clip artifact -compatible with biopsy-proven neoplasm.  A 5 x 9 mm circumscribed oval mass within the inner lower left breast (anterior third) exhibits plateau type kinetics and is indeterminate. No definite fatty hilum is noted.  Lymph nodes: A 1.4 cm upper left axillary lymph node has an asymmetrically thickened cortex medially (best seen on image [id number] 165 on axial contrast images) and suspicious for lymphatic tumor spread.  No other abnormal appearing lymph nodes are identified.  Ancillary findings:  Mild cardiomegaly identified.  IMPRESSION: 3 cm biopsy-proven neoplasm within the upper outer left breast. Asymmetric cortical thickening of up upper left axillary lymph node suspicious for lymphatic tumor spread. Second-look left axillary ultrasound and possible biopsy is recommended.  Indeterminate 5 x 9 mm circumscribed oval mass within the inner lower left breast. Although this could represent an intraparenchymal lymph node or  cystic changes, left breast ultrasound and possible biopsy is recommended for further evaluation.  No evidence of contralateral neoplasm.  RECOMMENDATION: Second look left breast and left axillary ultrasound with possible biopsies. This ultrasound will be arranged by or office.  BI-RADS CATEGORY  4: Suspicious.   Electronically Signed   By: Hassan Rowan M.D.   On: 10/24/2013 11:34   [place] Core Biopsy  10/17/2013   CLINICAL DATA:  Left breast mass.  EXAM: ULTRASOUND GUIDED LEFT BREAST CORE NEEDLE BIOPSY WITH VACUUM ASSIST  COMPARISON:  Previous exams.  PROCEDURE: I met with the patient and we discussed the procedure of ultrasound-guided biopsy, including benefits and alternatives. We discussed the high likelihood of a successful procedure. We discussed the risks of the procedure including infection, bleeding, tissue injury, clip migration, and inadequate sampling. Informed written consent was given. The usual time-out protocol was performed immediately prior to the procedure.  Using sterile technique and 2% Lidocaine as local anesthetic, under direct ultrasound visualization, a 12 gauge vacuum-assisted device was used to perform biopsy of left breast mass at 230, 6 cm from the nipple, using a inferior to superior approach. At the conclusion of the procedure, a ribbon shaped tissue marker clip was deployed into the biopsy cavity. Follow-up 2-view mammogram was performed and dictated separately.  IMPRESSION: Pathology demonstrates high grade invasive ductal carcinoma. These results are concordant. Surgical consultation and MRI is recommended. We will contact the patient regarding an appointment for a breast MRI. The patient wishes to speak with her OBGYN regarding a surgical referral. The patient was notified of results and recommendations on 10/13/2013. She reports soreness of the biopsy site. Otherwise, she has no complaints.  ADDENDUM: Pathology demonstrates high grade invasive ductal carcinoma. These results are  concordant. Surgical consultation and MRI is recommended. We will contact the patient regarding an appointment for a breast MRI. The patient wishes to speak with her OBGYN regarding a surgical referral. The patient was notified of results and recommendations on 10/13/2013. She reports soreness of the biopsy site. Otherwise, she has no complaints.   Electronically Signed   By: Donavan Burnet M.D.   On: 10/17/2013 10:05   Mm Digital Diagnostic Unilat L  10/30/2013   CLINICAL DATA:  Status post ultrasound-guided core needle biopsy of a 7 mm mass in the 7:30 o'clock position of the left breast.  EXAM: POST-BIOPSY CLIP PLACEMENT LEFT DIAGNOSTIC MAMMOGRAM  COMPARISON:  Previous exams.  FINDINGS: Films are  performed following ultrasound guided biopsy of a 7 mm mass in the 7:30 o'clock position of the left breast. These demonstrate a coil shaped biopsy marker clip at the expected location of the biopsied mass. There is also a ribbon shaped clip within the previously biopsied invasive ductal carcinoma in the upper outer left breast. The clips are located 7 cm apart.  IMPRESSION: Appropriate clip deployment following left breast ultrasound-guided core needle biopsy.  Final Assessment: Post Procedure Mammograms for Marker Placement   Electronically Signed   By: Enrique Sack M.D.   On: 10/30/2013 14:42   Mm Digital Diagnostic Unilat L  10/11/2013   CLINICAL DATA:  Abnormal screening mammogram.  EXAM: DIGITAL DIAGNOSTIC  LEFT MAMMOGRAM WITH CAD  ULTRASOUND LEFT BREAST  COMPARISON:  Multiple priors  ACR Breast Density Category b: There are scattered areas of fibroglandular density.  FINDINGS: An irregular mass is identified in the upper-outer left breast, measuring up to 3 cm.  Mammogram following an ultrasound-guided biopsy of the left breast mass at 230, 6 cm from the nipple, demonstrates satisfactory placement of a ribbon shaped clip.  Mammographic images were processed with CAD.  On physical exam, there is a firm  fixed mass at 230, 6 cm from the nipple.  Targeted ultrasound demonstrates an irregular hypoechoic mass with mild posterior acoustic shadowing at 230, 6 cm from the nipple. It measures 2.8 x 1.9 x 3 cm. Evaluation of the left axilla is negative for adenopathy.  IMPRESSION: 1. Suspicious left breast mass. 2. Satisfactory clip placement following an ultrasound-guided biopsy.  RECOMMENDATION: Ultrasound-guided biopsy of the left breast mass. This was performed the same date as the patient's diagnostic examination.  I have discussed the findings and recommendations with the patient. Results were also provided in writing at the conclusion of the visit. If applicable, a reminder letter will be sent to the patient regarding the next appointment.  BI-RADS CATEGORY  5: Highly suggestive of malignancy - appropriate action should be taken.   Electronically Signed   By: Donavan Burnet M.D.   On: 10/11/2013 15:34   US Breast Ltd Uni Left Inc Axilla  10/30/2013   CLINICAL DATA:  Recently diagnosed left breast upper outer quadrant invasive ductal carcinoma. Additional 9 mm mass seen in the lower inner quadrant of the left breast on a recent staging MRI. There was also a mildly abnormal appearing left axillary lymph node on the MRI.  EXAM: ULTRASOUND OF THE LEFT BREAST  COMPARISON:  Previous examinations, including the bilateral breast MRI dated 10/23/2013.  FINDINGS: On physical exam,no mass is palpable in the left axilla or lower inner left breast.  Ultrasound is performed, showing a left axillary lymph node with mild asymmetrical cortical thickening. There is also 7 x 6 x 4 mm mildly irregular, hypoechoic, obliquely oriented mass in the 7:30 o'clock position of the left breast, 4 cm from the nipple. This is in the region of the 9 mm mass seen on the MRI.  IMPRESSION: 7 mm indeterminate mass in the 7:30 o'clock position of the left breast and mildly abnormal appearing left axillary lymph node. Ultrasound-guided core  needle biopsies of the mass and lymph node are recommended and scheduled to follow.  RECOMMENDATION: Left breast and left axillary lymph node ultrasound-guided core needle biopsies (scheduled follow).  I have discussed the findings and recommendations with the patient. Results were also provided in writing at the conclusion of the visit. If applicable, a reminder letter will be sent to the patient regarding the  next appointment.  BI-RADS CATEGORY  4: Suspicious.   Electronically Signed   By: Enrique Sack M.D.   On: 10/30/2013 14:31   US Breast Ltd Uni Left Inc Axilla  10/11/2013   CLINICAL DATA:  Abnormal screening mammogram.  EXAM: DIGITAL DIAGNOSTIC  LEFT MAMMOGRAM WITH CAD  ULTRASOUND LEFT BREAST  COMPARISON:  Multiple priors  ACR Breast Density Category b: There are scattered areas of fibroglandular density.  FINDINGS: An irregular mass is identified in the upper-outer left breast, measuring up to 3 cm.  Mammogram following an ultrasound-guided biopsy of the left breast mass at 230, 6 cm from the nipple, demonstrates satisfactory placement of a ribbon shaped clip.  Mammographic images were processed with CAD.  On physical exam, there is a firm fixed mass at 230, 6 cm from the nipple.  Targeted ultrasound demonstrates an irregular hypoechoic mass with mild posterior acoustic shadowing at 230, 6 cm from the nipple. It measures 2.8 x 1.9 x 3 cm. Evaluation of the left axilla is negative for adenopathy.  IMPRESSION: 1. Suspicious left breast mass. 2. Satisfactory clip placement following an ultrasound-guided biopsy.  RECOMMENDATION: Ultrasound-guided biopsy of the left breast mass. This was performed the same date as the patient's diagnostic examination.  I have discussed the findings and recommendations with the patient. Results were also provided in writing at the conclusion of the visit. If applicable, a reminder letter will be sent to the patient regarding the next appointment.  BI-RADS CATEGORY  5: Highly  suggestive of malignancy - appropriate action should be taken.   Electronically Signed   By: Donavan Burnet M.D.   On: 10/11/2013 15:34   [place] Lt Breast Bx W Loc Dev 1st Lesion Img Bx Spec US Guide  11/01/2013   ADDENDUM REPORT: 11/01/2013 08:23  ADDENDUM: The final pathological diagnosis is benign breast tissue and focal fibrocystic changes. This is concordant with the imaging findings. The final pathological diagnosis was discussed with the patient by telephone on 10/31/2013. Her questions were answered. She reported a small amount of pain and bruising at the biopsy site with no palpable hematoma.   Electronically Signed   By: Enrique Sack M.D.   On: 11/01/2013 08:23   11/01/2013   CLINICAL DATA:  7 mm of the indeterminate mass in the 7:30 o'clock position of the left breast at recent ultrasound and MRI.  EXAM: ULTRASOUND GUIDED LEFT BREAST CORE NEEDLE BIOPSY  COMPARISON:  Previous exams.  PROCEDURE: I met with the patient and we discussed the procedure of ultrasound-guided biopsy, including benefits and alternatives. We discussed the high likelihood of a successful procedure. We discussed the risks of the procedure including infection, bleeding, tissue injury, clip migration, and inadequate sampling. Informed written consent was given. The usual time-out protocol was performed immediately prior to the procedure.  Using sterile technique and 2% Lidocaine as local anesthetic, under direct ultrasound visualization, a 12 gauge vacuum-assisteddevice was used to perform biopsy of the recently demonstrated 7 mm mass in the 7:30 o'clock position of the left breast, 4 cm from the nipple, using a lateral approach. At the conclusion of the procedure, a coil shaped tissue marker clip was deployed into the biopsy cavity. Follow-up 2-view mammogram was performed and dictated separately.  IMPRESSION: Ultrasound-guided biopsy of a 7 mm mass in the 7:30 o'clock position of the left breast. No apparent complications.   Electronically Signed: By: Enrique Sack M.D. On: 10/30/2013 14:32   [place] Lt Breast Bx W Loc Dev 1st Lesion Img  Bx Spec US Guide  10/13/2013   ADDENDUM REPORT: 10/13/2013 12:35  ADDENDUM: Pathology demonstrates high grade invasive ductal carcinoma. These results are concordant. Surgical consultation and MRI is recommended. We will contact the patient regarding an appointment for a breast MRI. The patient wishes to speak with her OBGYN regarding a surgical referral. The patient was notified of results and recommendations on 10/13/2013. She reports soreness of the biopsy site. Otherwise, she has no complaints.   Electronically Signed   By: Donavan Burnet M.D.   On: 10/13/2013 12:35   10/13/2013   CLINICAL DATA:  Left breast mass.  EXAM: ULTRASOUND GUIDED LEFT BREAST CORE NEEDLE BIOPSY WITH VACUUM ASSIST  COMPARISON:  Previous exams.  PROCEDURE: I met with the patient and we discussed the procedure of ultrasound-guided biopsy, including benefits and alternatives. We discussed the high likelihood of a successful procedure. We discussed the risks of the procedure including infection, bleeding, tissue injury, clip migration, and inadequate sampling. Informed written consent was given. The usual time-out protocol was performed immediately prior to the procedure.  Using sterile technique and 2% Lidocaine as local anesthetic, under direct ultrasound visualization, a 12 gauge vacuum-assisteddevice was used to perform biopsy of left breast mass at 230, 6 cm from the nipple, using a inferior to superior approach. At the conclusion of the procedure, a ribbon shaped tissue marker clip was deployed into the biopsy cavity. Follow-up 2-view mammogram was performed and dictated separately.  IMPRESSION: Ultrasound-guided biopsy of a left breast mass. No apparent complications.  Electronically Signed: By: Donavan Burnet M.D. On: 10/11/2013 15:35   [place] Lt Breast Bx W Loc Dev Ea Add Lesion Img Bx Spec US Guide  11/01/2013    ADDENDUM REPORT: 11/01/2013 08:24  ADDENDUM: The final pathological diagnosis is benign lymph node. This is concordant with the imaging findings. The final pathological diagnosis was discussed with the patient by telephone on 10/31/2013. Her questions were answered. She reported a small amount of pain and bruising at the biopsy site with no palpable hematoma.   Electronically Signed   By: Enrique Sack M.D.   On: 11/01/2013 08:24   11/01/2013   CLINICAL DATA:  Mildly abnormal appearing left axillary lymph node at recent MRI and ultrasound. Recently diagnosed invasive ductal carcinoma in the upper-outer quadrant of the left breast.  EXAM: ULTRASOUND GUIDED CORE NEEDLE BIOPSY OF A LEFT AXILLARY NODE  COMPARISON:  Previous exams.  FINDINGS: I met with the patient and we discussed the procedure of ultrasound-guided biopsy, including benefits and alternatives. We discussed the high likelihood of a successful procedure. We discussed the risks of the procedure, including infection, bleeding, tissue injury, clip migration, and inadequate sampling. Informed written consent was given. The usual time-out protocol was performed immediately prior to the procedure.  Using sterile technique and 2% Lidocaine as local anesthetic, under direct ultrasound visualization, a 14 gauge spring-loaded device was used to perform biopsy of the recently demonstrated mildly abnormal appearing left axillary lymph node using an inferior approach.  IMPRESSION: Ultrasound guided biopsy of a mildly abnormal appearing left axillary lymph node. No apparent complications.  Electronically Signed: By: Enrique Sack M.D. On: 10/30/2013 14:34      ASSESSMENT AND PLAN: Breast cancer of upper-outer quadrant of left female breast Pt has new diagnosis of cT2N0 left breast cancer that is Her-2 positive.   Given the size of the mass, we will plan neoadjuvant chemotherapy.  I will place port a cath for this.  She will do her staging  studies and chemo class.     I reviewed port a cath placement including incision, process, and risks.  We reviewed the risks of bleeding, infection, malfunction, pneumothorax, need for additional procedures or surgeries.   I discussed recovery time and restrictions.    We will plan follow up in the middle of chemo with MRI.  Our plan would be to do breast conservation with lumpectomy and axillary SLN bx.  We also discussed following BCT with radiation and antihormone treatment.    45 min spent in evaluation, examination, counseling, and coordination of care.       Milus Height MD Surgical Oncology, [facility]      Visit Diagnoses: 1. Breast cancer of upper-outer quadrant of left female breast     Primary Care Physician: Criselda Peaches, MD [name] [place].

## 2013-11-01 NOTE — Assessment & Plan Note (Signed)
Pt has new diagnosis of cT2N0 left breast cancer that is Her-2 positive.   Given the size of the mass, we will plan neoadjuvant chemotherapy.  I will place port a cath for this.  She will do her staging studies and chemo class.    I reviewed port a cath placement including incision, process, and risks.  We reviewed the risks of bleeding, infection, malfunction, pneumothorax, need for additional procedures or surgeries.   I discussed recovery time and restrictions.    We will plan follow up in the middle of chemo with MRI.  Our plan would be to do breast conservation with lumpectomy and axillary SLN bx.  We also discussed following BCT with radiation and antihormone treatment.    45 min spent in evaluation, examination, counseling, and coordination of care.   

## 2013-11-01 NOTE — Progress Notes (Signed)
[place]  [phone] [phone]     ID: April Walker OB: 09-25-1950  MR#: [id number]  JSH#:[id number]  PCP: Criselda Peaches, MD GYN:  Vania Rea SU: Stark Klein  OTHER MD: [name], Silvano Rusk, Arther Abbott  CHIEF COMPLAINT: "I have this cancer"  BREAST CANCER HISTORY: April Walker had routine screening mammography at [facility] 09/14/2048. This showed a possible mass in the left breast. On 10/11/2013 she underwent left diagnostic mammography and ultrasonography which confirmed an irregular mass in the upper outer left breast measuring up to 3 cm. This was firm and fixed at the 2:30 o'clock position, 6 cm from the nipple. Ultrasound showed an irregular hypoechoic mass measuring 2.8 cm. The left axilla showed no evidence of adenopathy.  On 10/11/2013 the patient underwent left breast biopsy, with the pathology [facility] [phone]) showing an invasive ductal carcinoma, high-grade, estrogen receptor 100% positive, progesterone receptor 43% positive, both with strong staining intensity, with an MIB-1 of 87% and HER-2 amplified, the signals ratio being 2.72, the number per cell 3.40.  On 10/23/2048 the patient underwent bilateral breast MRI. This showed the breast and position to be density be. In the left breast there was a 3 cm irregular enhancing mass in the upper outer quadrant. There was a 9 mm oval mass in the inner lower left breast with no definite fatty hilum. There was a 1.4 cm upper left axillary lymph node with a thickened cortex. There were no other abnormal appearing lymph nodes. Ultrasound of the left breast April 20 7.15 found of the left axillary lymph node with the mildly asymmetrical cortex, and a 7 mm her mass in the 7:30 o'clock position of the left breast. Biopsy of both these suspicious areas was performed 10/30/2013, and the preliminary report is that they're both benign.  The patient's subsequent history is as detailed  below  INTERVAL HISTORY: April Walker was evaluated in the multidisciplinary breast cancer clinic 11/01/2013 accompanied by her friend Corporate treasurer. Her case was also discussed that same morning in the multidisciplinary breast cancer conference  REVIEW OF SYSTEMS: There were no specific symptoms leading to the original mammogram, which was routinely scheduled. The patient denies unusual headaches, visual changes, nausea, vomiting, stiff neck, dizziness, or gait imbalance. There has been no cough, phlegm production, or pleurisy, no chest pain or pressure, and no change in bowel or bladder habits. The patient denies fever, rash, bleeding, unexplained fatigue or unexplained weight loss. She has occasional arthritic discomfort which is not more intense or persistent than usual, has mild hemorrhoidal issues at times, and thinks she may have pulled a hamstring. She exercises 5 days a week. A detailed review of systems was otherwise entirely negative.  PAST MEDICAL HISTORY: Past Medical History  Diagnosis Date  . Abnormal cholesterol test   . Hyperlipidemia   . Knee pain, left   . GERD (gastroesophageal reflux disease)   . Arthritis   . Tubulovillous adenoma of colon   . Diverticulosis   . Internal hemorrhoids     PAST SURGICAL HISTORY: Past Surgical History  Procedure Laterality Date  . Tonsillectomy and adenoidectomy    . Right knee arthroscopy  2011 Dr. Aline Brochure  . Treatment fistula anal    . Upper gastrointestinal endoscopy    . Hemorrhoid surgery    . Colonoscopy  2002    hemorrhoids  . Colonoscopy w/ polypectomy  12/09/2010    1 cm sigmoid polyp - , diverticulosis, hemorrhoids  . Knee surgery    .  Knee surgery  2012    Left knee arthroscopy  . Total knee arthroplasty  11/23/2011    Procedure: TOTAL KNEE ARTHROPLASTY;  Surgeon: Carole Civil, MD;  Location: [facility];  Service: Orthopedics;  Laterality: Left;    FAMILY HISTORY Family History  Problem Relation Age of Onset  . Adopted: Yes  .  Stroke Mother   . Heart attack Father    the patient is adopted and has no information on her biological family. the patient's adoptive father died at the age of 51 from a myocardial infarction; productive mother died from a stroke at age 11. The patient does not know of any siblings  GYNECOLOGIC HISTORY:  Menarche age 24. The patient is GX P0. She underwent menopause in her early 47s and took hormone replacement until 2010.  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 She is single, and lives at home with her black lab, [name], and her cats Patches and [name]    ADVANCED DIRECTIVES: Not in place   HEALTH MAINTENANCE: History  Substance Use Topics  . Smoking status: Never Smoker   . Smokeless tobacco: Never Used  . Alcohol Use: Yes     Comment: socially     Colonoscopy: 2012  PAP: 2014  Bone density:  Lipid panel:  Allergies  Allergen Reactions  . Sulfonamide Derivatives Anaphylaxis and Hives    Pt. Stated, "large welps on neck immediately."  . Triprolidine-Pse Other (See Comments)    This is Actifed. Increased heart rate to tachycardia.    Current Outpatient Prescriptions  Medication Sig Dispense Refill  . Acetaminophen (TYLENOL EXTRA STRENGTH PO) Take by mouth.      . hydrocortisone (ANUSOL-HC) 25 MG suppository Place 1 suppository (25 mg total) rectally at bedtime as needed for hemorrhoids or itching.  10 suppository  0  . loratadine (CLARITIN) 10 MG tablet Take 10 mg by mouth daily as needed. For allergies      . rosuvastatin (CRESTOR) 5 MG tablet Take 5 mg by mouth daily.       No current facility-administered medications for this visit.    OBJECTIVE: Middle-aged white woman in no acute distress Filed Vitals:   11/01/13 0838  BP: 146/85  Pulse: 80  Temp: 98.8 F (37.1 C)  Resp: 20     Body mass index is 32.78 kg/(m^2).    ECOG FS:0 - Asymptomatic  Ocular: Sclerae unicteric, pupils equal, round  and reactive to light Ear-nose-throat: Oropharynx clear and moist Lymphatic: No cervical or supraclavicular adenopathy Lungs no rales or rhonchi, good excursion bilaterally Heart regular rate and rhythm, no murmur appreciated Abd soft, nontender, positive bowel sounds MSK no focal spinal tenderness, no joint edema Neuro: non-focal, well-oriented, appropriate affect Breasts: The right breast is unremarkable. In the left breast upper outer quadrant there is a firm area that is movable, not associated with any skin or nipple changes, and measures approximately 3 cm.. I do not palpate any left axillary adenopathy   LAB RESULTS:  CMP     Component Value Date/Time   NA 135 11/24/2011 0513   K 3.9 11/24/2011 0513   CL 102 11/24/2011 0513   CO2 25 11/24/2011 0513   GLUCOSE 136* 11/24/2011 0513   BUN 11 11/24/2011 0513   CREATININE 0.54 11/24/2011 0513   CALCIUM 9.0 11/24/2011 0513   GFRNONAA >90 11/24/2011 0513   GFRAA >90 11/24/2011 0513    I No results  found for this basename: SPEP, UPEP,  kappa and lambda light chains    Lab Results  Component Value Date   WBC 10.0 11/26/2011   NEUTROABS 3.7 11/11/2011   HGB 10.3* 11/26/2011   HCT 31.3* 11/26/2011   MCV 81.1 11/26/2011   PLT 199 11/26/2011      Chemistry      Component Value Date/Time   NA 135 11/24/2011 0513   K 3.9 11/24/2011 0513   CL 102 11/24/2011 0513   CO2 25 11/24/2011 0513   BUN 11 11/24/2011 0513   CREATININE 0.54 11/24/2011 0513      Component Value Date/Time   CALCIUM 9.0 11/24/2011 0513       No results found for this basename: LABCA2    No components found with this basename: LABCA125    No results found for this basename: INR,  in the last 168 hours  Urinalysis No results found for this basename: colorurine, appearanceur, labspec, phurine, glucoseu, hgbur, bilirubinur, ketonesur, proteinur, urobilinogen, nitrite, leukocytesur    STUDIES: Mr Breast Bilateral W Wo Contrast  10/24/2013   CLINICAL DATA:   63 year old female with newly diagnosed left breast cancer.  LABS:  Performed at outside institution  EXAM: BILATERAL BREAST MRI WITH AND WITHOUT CONTRAST  TECHNIQUE: Multiplanar, multisequence MR images of both breasts were obtained prior to and following the intravenous administration of 39m of MultiHance.  THREE-DIMENSIONAL MR IMAGE RENDERING ON INDEPENDENT WORKSTATION:  Three-dimensional MR images were rendered by post-processing of the original MR data on an independent workstation. The three-dimensional MR images were interpreted, and findings are reported in the following complete MRI report for this study. Three dimensional images were evaluated at the independent DynaCad workstation  COMPARISON:  Previous exams  FINDINGS: Breast composition: b.  Scattered fibroglandular tissue.  Background parenchymal enhancement: Mild  Right breast: No mass or abnormal enhancement.  Left breast: A 3 cm irregular enhancing mass with rapid washout kinetics is identified within the upper-outer left breast (junction of the middle posterior thirds) and contains biopsy clip artifact -compatible with biopsy-proven neoplasm.  A 5 x 9 mm circumscribed oval mass within the inner lower left breast (anterior third) exhibits plateau type kinetics and is indeterminate. No definite fatty hilum is noted.  Lymph nodes: A 1.4 cm upper left axillary lymph node has an asymmetrically thickened cortex medially (best seen on image 163- 165 on axial contrast images) and suspicious for lymphatic tumor spread.  No other abnormal appearing lymph nodes are identified.  Ancillary findings:  Mild cardiomegaly identified.  IMPRESSION: 3 cm biopsy-proven neoplasm within the upper outer left breast. Asymmetric cortical thickening of up upper left axillary lymph node suspicious for lymphatic tumor spread. Second-look left axillary ultrasound and possible biopsy is recommended.  Indeterminate 5 x 9 mm circumscribed oval mass within the inner lower left  breast. Although this could represent an intraparenchymal lymph node or cystic changes, left breast ultrasound and possible biopsy is recommended for further evaluation.  No evidence of contralateral neoplasm.  RECOMMENDATION: Second look left breast and left axillary ultrasound with possible biopsies. This ultrasound will be arranged by or office.  BI-RADS CATEGORY  4: Suspicious.   Electronically Signed   By: JHassan RowanM.D.   On: 10/24/2013 11:34   [place] Biopsy  10/17/2013   CLINICAL DATA:  Left breast mass.  EXAM: ULTRASOUND GUIDED LEFT BREAST CORE NEEDLE BIOPSY WITH VACUUM ASSIST  COMPARISON:  Previous exams.  PROCEDURE: I met with the patient and we discussed the procedure  of ultrasound-guided biopsy, including benefits and alternatives. We discussed the high likelihood of a successful procedure. We discussed the risks of the procedure including infection, bleeding, tissue injury, clip migration, and inadequate sampling. Informed written consent was given. The usual time-out protocol was performed immediately prior to the procedure.  Using sterile technique and 2% Lidocaine as local anesthetic, under direct ultrasound visualization, a 12 gauge vacuum-assisted device was used to perform biopsy of left breast mass at 230, 6 cm from the nipple, using a inferior to superior approach. At the conclusion of the procedure, a ribbon shaped tissue marker clip was deployed into the biopsy cavity. Follow-up 2-view mammogram was performed and dictated separately.  IMPRESSION: Pathology demonstrates high grade invasive ductal carcinoma. These results are concordant. Surgical consultation and MRI is recommended. We will contact the patient regarding an appointment for a breast MRI. The patient wishes to speak with her OBGYN regarding a surgical referral. The patient was notified of results and recommendations on 10/13/2013. She reports soreness of the biopsy site. Otherwise, she has no complaints.  ADDENDUM: Pathology  demonstrates high grade invasive ductal carcinoma. These results are concordant. Surgical consultation and MRI is recommended. We will contact the patient regarding an appointment for a breast MRI. The patient wishes to speak with her OBGYN regarding a surgical referral. The patient was notified of results and recommendations on 10/13/2013. She reports soreness of the biopsy site. Otherwise, she has no complaints.   Electronically Signed   By: Donavan Burnet M.D.   On: 10/17/2013 10:05   Mm Digital Diagnostic Unilat L  10/30/2013   CLINICAL DATA:  Status post ultrasound-guided core needle biopsy of a 7 mm mass in the 7:30 o'clock position of the left breast.  EXAM: POST-BIOPSY CLIP PLACEMENT LEFT DIAGNOSTIC MAMMOGRAM  COMPARISON:  Previous exams.  FINDINGS: Films are performed following ultrasound guided biopsy of a 7 mm mass in the 7:30 o'clock position of the left breast. These demonstrate a coil shaped biopsy marker clip at the expected location of the biopsied mass. There is also a ribbon shaped clip within the previously biopsied invasive ductal carcinoma in the upper outer left breast. The clips are located 7 cm apart.  IMPRESSION: Appropriate clip deployment following left breast ultrasound-guided core needle biopsy.  Final Assessment: Post Procedure Mammograms for Marker Placement   Electronically Signed   By: Enrique Sack M.D.   On: 10/30/2013 14:42   Mm Digital Diagnostic Unilat L  10/11/2013   CLINICAL DATA:  Abnormal screening mammogram.  EXAM: DIGITAL DIAGNOSTIC  LEFT MAMMOGRAM WITH CAD  ULTRASOUND LEFT BREAST  COMPARISON:  Multiple priors  ACR Breast Density Category b: There are scattered areas of fibroglandular density.  FINDINGS: An irregular mass is identified in the upper-outer left breast, measuring up to 3 cm.  Mammogram following an ultrasound-guided biopsy of the left breast mass at 230, 6 cm from the nipple, demonstrates satisfactory placement of a ribbon shaped clip.  Mammographic  images were processed with CAD.  On physical exam, there is a firm fixed mass at 230, 6 cm from the nipple.  Targeted ultrasound demonstrates an irregular hypoechoic mass with mild posterior acoustic shadowing at 230, 6 cm from the nipple. It measures 2.8 x 1.9 x 3 cm. Evaluation of the left axilla is negative for adenopathy.  IMPRESSION: 1. Suspicious left breast mass. 2. Satisfactory clip placement following an ultrasound-guided biopsy.  RECOMMENDATION: Ultrasound-guided biopsy of the left breast mass. This was performed the same date as the patient's diagnostic  examination.  I have discussed the findings and recommendations with the patient. Results were also provided in writing at the conclusion of the visit. If applicable, a reminder letter will be sent to the patient regarding the next appointment.  BI-RADS CATEGORY  5: Highly suggestive of malignancy - appropriate action should be taken.   Electronically Signed   By: Donavan Burnet M.D.   On: 10/11/2013 15:34   US Breast Ltd Uni Left Inc Axilla  10/30/2013   CLINICAL DATA:  Recently diagnosed left breast upper outer quadrant invasive ductal carcinoma. Additional 9 mm mass seen in the lower inner quadrant of the left breast on a recent staging MRI. There was also a mildly abnormal appearing left axillary lymph node on the MRI.  EXAM: ULTRASOUND OF THE LEFT BREAST  COMPARISON:  Previous examinations, including the bilateral breast MRI dated 10/23/2013.  FINDINGS: On physical exam,no mass is palpable in the left axilla or lower inner left breast.  Ultrasound is performed, showing a left axillary lymph node with mild asymmetrical cortical thickening. There is also 7 x 6 x 4 mm mildly irregular, hypoechoic, obliquely oriented mass in the 7:30 o'clock position of the left breast, 4 cm from the nipple. This is in the region of the 9 mm mass seen on the MRI.  IMPRESSION: 7 mm indeterminate mass in the 7:30 o'clock position of the left breast and mildly  abnormal appearing left axillary lymph node. Ultrasound-guided core needle biopsies of the mass and lymph node are recommended and scheduled to follow.  RECOMMENDATION: Left breast and left axillary lymph node ultrasound-guided core needle biopsies (scheduled follow).  I have discussed the findings and recommendations with the patient. Results were also provided in writing at the conclusion of the visit. If applicable, a reminder letter will be sent to the patient regarding the next appointment.  BI-RADS CATEGORY  4: Suspicious.   Electronically Signed   By: Enrique Sack M.D.   On: 10/30/2013 14:31   US Breast Ltd Uni Left Inc Axilla  10/11/2013   CLINICAL DATA:  Abnormal screening mammogram.  EXAM: DIGITAL DIAGNOSTIC  LEFT MAMMOGRAM WITH CAD  ULTRASOUND LEFT BREAST  COMPARISON:  Multiple priors  ACR Breast Density Category b: There are scattered areas of fibroglandular density.  FINDINGS: An irregular mass is identified in the upper-outer left breast, measuring up to 3 cm.  Mammogram following an ultrasound-guided biopsy of the left breast mass at 230, 6 cm from the nipple, demonstrates satisfactory placement of a ribbon shaped clip.  Mammographic images were processed with CAD.  On physical exam, there is a firm fixed mass at 230, 6 cm from the nipple.  Targeted ultrasound demonstrates an irregular hypoechoic mass with mild posterior acoustic shadowing at 230, 6 cm from the nipple. It measures 2.8 x 1.9 x 3 cm. Evaluation of the left axilla is negative for adenopathy.  IMPRESSION: 1. Suspicious left breast mass. 2. Satisfactory clip placement following an ultrasound-guided biopsy.  RECOMMENDATION: Ultrasound-guided biopsy of the left breast mass. This was performed the same date as the patient's diagnostic examination.  I have discussed the findings and recommendations with the patient. Results were also provided in writing at the conclusion of the visit. If applicable, a reminder letter will be sent to the  patient regarding the next appointment.  BI-RADS CATEGORY  5: Highly suggestive of malignancy - appropriate action should be taken.   Electronically Signed   By: Donavan Burnet M.D.   On: 10/11/2013 15:34   [place]  Lt Breast Bx W Loc Dev 1st Lesion Img Bx Spec US Guide  11/01/2013   ADDENDUM REPORT: 11/01/2013 08:23  ADDENDUM: The final pathological diagnosis is benign breast tissue and focal fibrocystic changes. This is concordant with the imaging findings. The final pathological diagnosis was discussed with the patient by telephone on 10/31/2013. Her questions were answered. She reported a small amount of pain and bruising at the biopsy site with no palpable hematoma.   Electronically Signed   By: Enrique Sack M.D.   On: 11/01/2013 08:23   11/01/2013   CLINICAL DATA:  7 mm of the indeterminate mass in the 7:30 o'clock position of the left breast at recent ultrasound and MRI.  EXAM: ULTRASOUND GUIDED LEFT BREAST CORE NEEDLE BIOPSY  COMPARISON:  Previous exams.  PROCEDURE: I met with the patient and we discussed the procedure of ultrasound-guided biopsy, including benefits and alternatives. We discussed the high likelihood of a successful procedure. We discussed the risks of the procedure including infection, bleeding, tissue injury, clip migration, and inadequate sampling. Informed written consent was given. The usual time-out protocol was performed immediately prior to the procedure.  Using sterile technique and 2% Lidocaine as local anesthetic, under direct ultrasound visualization, a 12 gauge vacuum-assisteddevice was used to perform biopsy of the recently demonstrated 7 mm mass in the 7:30 o'clock position of the left breast, 4 cm from the nipple, using a lateral approach. At the conclusion of the procedure, a coil shaped tissue marker clip was deployed into the biopsy cavity. Follow-up 2-view mammogram was performed and dictated separately.  IMPRESSION: Ultrasound-guided biopsy of a 7 mm mass in the 7:30  o'clock position of the left breast. No apparent complications.  Electronically Signed: By: Enrique Sack M.D. On: 10/30/2013 14:32   [place] Lt Breast Bx W Loc Dev 1st Lesion Img Bx Spec US Guide  10/13/2013   ADDENDUM REPORT: 10/13/2013 12:35  ADDENDUM: Pathology demonstrates high grade invasive ductal carcinoma. These results are concordant. Surgical consultation and MRI is recommended. We will contact the patient regarding an appointment for a breast MRI. The patient wishes to speak with her OBGYN regarding a surgical referral. The patient was notified of results and recommendations on 10/13/2013. She reports soreness of the biopsy site. Otherwise, she has no complaints.   Electronically Signed   By: Donavan Burnet M.D.   On: 10/13/2013 12:35   10/13/2013   CLINICAL DATA:  Left breast mass.  EXAM: ULTRASOUND GUIDED LEFT BREAST CORE NEEDLE BIOPSY WITH VACUUM ASSIST  COMPARISON:  Previous exams.  PROCEDURE: I met with the patient and we discussed the procedure of ultrasound-guided biopsy, including benefits and alternatives. We discussed the high likelihood of a successful procedure. We discussed the risks of the procedure including infection, bleeding, tissue injury, clip migration, and inadequate sampling. Informed written consent was given. The usual time-out protocol was performed immediately prior to the procedure.  Using sterile technique and 2% Lidocaine as local anesthetic, under direct ultrasound visualization, a 12 gauge vacuum-assisteddevice was used to perform biopsy of left breast mass at 230, 6 cm from the nipple, using a inferior to superior approach. At the conclusion of the procedure, a ribbon shaped tissue marker clip was deployed into the biopsy cavity. Follow-up 2-view mammogram was performed and dictated separately.  IMPRESSION: Ultrasound-guided biopsy of a left breast mass. No apparent complications.  Electronically Signed: By: Donavan Burnet M.D. On: 10/11/2013 15:35   [place] Lt Breast  Bx W Loc Dev Ea Add Lesion Img Bx  Spec US Guide  11/01/2013   ADDENDUM REPORT: 11/01/2013 08:24  ADDENDUM: The final pathological diagnosis is benign lymph node. This is concordant with the imaging findings. The final pathological diagnosis was discussed with the patient by telephone on 10/31/2013. Her questions were answered. She reported a small amount of pain and bruising at the biopsy site with no palpable hematoma.   Electronically Signed   By: Enrique Sack M.D.   On: 11/01/2013 08:24   11/01/2013   CLINICAL DATA:  Mildly abnormal appearing left axillary lymph node at recent MRI and ultrasound. Recently diagnosed invasive ductal carcinoma in the upper-outer quadrant of the left breast.  EXAM: ULTRASOUND GUIDED CORE NEEDLE BIOPSY OF A LEFT AXILLARY NODE  COMPARISON:  Previous exams.  FINDINGS: I met with the patient and we discussed the procedure of ultrasound-guided biopsy, including benefits and alternatives. We discussed the high likelihood of a successful procedure. We discussed the risks of the procedure, including infection, bleeding, tissue injury, clip migration, and inadequate sampling. Informed written consent was given. The usual time-out protocol was performed immediately prior to the procedure.  Using sterile technique and 2% Lidocaine as local anesthetic, under direct ultrasound visualization, a 14 gauge spring-loaded device was used to perform biopsy of the recently demonstrated mildly abnormal appearing left axillary lymph node using an inferior approach.  IMPRESSION: Ultrasound guided biopsy of a mildly abnormal appearing left axillary lymph node. No apparent complications.  Electronically Signed: By: Enrique Sack M.D. On: 10/30/2013 14:34    ASSESSMENT: 63 y.o. [place] woman status post left breast biopsy 10/11/2013 for a clinical T2 N0, stage IIA invasive ductal carcinoma, grade 3, estrogen and progesterone receptor positive, with an MIB-1 of 87%, and HER-2 amplified  (1) to receive  carboplatin, docetaxel, trastuzumab and pertuzumab every 3 weeks x6 with Neulasta day 2, starting 11/20/2013  (2) trastuzumab alone to continue to complete a year  (3) surgery to follow chemotherapy  (4) radiation to follow surgery  (5) anti-estrogens to follow radiation    PLAN: We spent the better part of today's hour-long appointment discussing the biology of breast cancer in general, and the specifics of the patient's tumor in particular. April Walker understands that from a survival point of view there is no difference between mastectomy as compared to lumpectomy and radiation. There is also no difference in survival between doing chemotherapy first then surgery, or in the opposite order. Our recommendation in her case is for neoadjuvant chemotherapy because it will make her surgery easier and improve the cosmetic result.  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 This is very favorable. In general chemotherapy reduces the risk of distant recurrence by about a third, anti-estrogens cut the residual risk in half, an anti-HER-2 treatment cut that risk again in half. We discussed some of the toxicities, side effects and complications of chemotherapy and anti-HER-2 treatment and she will come to chemotherapy school next week to learn more about that and to have a tour of the treatment area.  She will need a port and she will need an echocardiogram. She will return to see [place] in about 2 weeks to review those results and to discuss the prescriptions for her antinausea medications. Her first chemotherapy treatment is aimed for May 18.  The overall plan then will be for carboplatin and docetaxel given every 3 weeks with Neulasta support x6, concurrent with trastuzumab and pertuzumab. After the chemotherapy has been completed the pertuzumab will be dropped with the trastuzumab will continue to a total year.  The patient will then proceed to surgery, then radiation. She will start anti-estrogens at the  completion of radiation  April Walker has a good understanding of the overall plan. She agrees with it. She knows a goal of treatment in her case is cure. She will call with any problems that may develop before her next visit here.   Chauncey Cruel, MD   11/01/2013 9:50 AM

## 2013-11-06 ENCOUNTER — Telehealth: Payer: Self-pay | Admitting: *Deleted

## 2013-11-06 NOTE — Telephone Encounter (Signed)
Received call back from patient.  No questions or concerns at this time.  Encouraged patient to call with any needs.

## 2013-11-06 NOTE — Telephone Encounter (Signed)
Left message for a return phone call from [facility] 11/01/13.  Awaiting patient response.  

## 2013-11-07 ENCOUNTER — Encounter: Payer: Self-pay | Admitting: *Deleted

## 2013-11-07 NOTE — Progress Notes (Signed)
[place] Psychosocial Distress Screening Clinical Social Work  Patient completed distress screening protocol, and scored a 7 on the Psychosocial Distress Thermometer which indicates moderate distress. Clinical Social Worker met with pt in [facility] to assess for distress and other psychosocial needs.  Pt stated she was doing "ok", and felt her distress level was much lower after meeting with the medical team and getting more information on her treatment plan.  CSW informed pt of the support team and support services at [facility].  CSW provided contact information and encouraged pt to call with any questions or concerns.     Johnnye Lana, MSW, [place] Worker [facility] [phone]

## 2013-11-09 ENCOUNTER — Telehealth: Payer: Self-pay | Admitting: Physician Assistant

## 2013-11-09 ENCOUNTER — Telehealth: Payer: Self-pay | Admitting: Oncology

## 2013-11-09 ENCOUNTER — Encounter: Payer: Self-pay | Admitting: *Deleted

## 2013-11-09 ENCOUNTER — Other Ambulatory Visit: Payer: 59

## 2013-11-09 NOTE — Telephone Encounter (Signed)
per pof to sch appt/will give pt sch in Chemo Edu Class

## 2013-11-09 NOTE — Telephone Encounter (Signed)
pt came by sch to pick up updated sch

## 2013-11-14 ENCOUNTER — Encounter: Payer: Self-pay | Admitting: Physician Assistant

## 2013-11-14 ENCOUNTER — Ambulatory Visit (HOSPITAL_BASED_OUTPATIENT_CLINIC_OR_DEPARTMENT_OTHER): Payer: 59 | Admitting: Physician Assistant

## 2013-11-14 ENCOUNTER — Telehealth: Payer: Self-pay | Admitting: Physician Assistant

## 2013-11-14 ENCOUNTER — Telehealth: Payer: Self-pay | Admitting: *Deleted

## 2013-11-14 ENCOUNTER — Other Ambulatory Visit: Payer: Self-pay | Admitting: Oncology

## 2013-11-14 ENCOUNTER — Ambulatory Visit (HOSPITAL_BASED_OUTPATIENT_CLINIC_OR_DEPARTMENT_OTHER): Payer: 59

## 2013-11-14 ENCOUNTER — Ambulatory Visit (HOSPITAL_COMMUNITY)
Admission: RE | Admit: 2013-11-14 | Discharge: 2013-11-14 | Disposition: A | Discharge status: Home / Self Care | Payer: 59 | Source: Ambulatory Visit | Attending: Oncology | Admitting: Oncology

## 2013-11-14 ENCOUNTER — Other Ambulatory Visit: Payer: Self-pay | Admitting: Physician Assistant

## 2013-11-14 VITALS — BP 144/81 | HR 102 | Temp 98.1°F | Resp 18 | Ht 68.5 in | Wt 220.3 lb

## 2013-11-14 DIAGNOSIS — C50419 Malignant neoplasm of upper-outer quadrant of unspecified female breast: Secondary | ICD-10-CM

## 2013-11-14 DIAGNOSIS — C50412 Malignant neoplasm of upper-outer quadrant of left female breast: Secondary | ICD-10-CM

## 2013-11-14 DIAGNOSIS — I517 Cardiomegaly: Secondary | ICD-10-CM

## 2013-11-14 DIAGNOSIS — C50919 Malignant neoplasm of unspecified site of unspecified female breast: Secondary | ICD-10-CM | POA: Insufficient documentation

## 2013-11-14 DIAGNOSIS — Z01810 Encounter for preprocedural cardiovascular examination: Secondary | ICD-10-CM | POA: Insufficient documentation

## 2013-11-14 DIAGNOSIS — Z170 Estrogen receptor positive status [ER+]: Secondary | ICD-10-CM

## 2013-11-14 LAB — CBC WITH DIFFERENTIAL/PLATELET
BASO%: 1 % (ref 0.0–2.0)
Basophils Absolute: 0.1 10*3/uL (ref 0.0–0.1)
EOS%: 2.4 % (ref 0.0–7.0)
Eosinophils Absolute: 0.2 10*3/uL (ref 0.0–0.5)
HCT: 39.6 % (ref 34.8–46.6)
HGB: 12.8 g/dL (ref 11.6–15.9)
LYMPH%: 26 % (ref 14.0–49.7)
MCH: 26.5 pg (ref 25.1–34.0)
MCHC: 32.3 g/dL (ref 31.5–36.0)
MCV: 82.1 fL (ref 79.5–101.0)
MONO#: 0.5 10*3/uL (ref 0.1–0.9)
MONO%: 6.8 % (ref 0.0–14.0)
NEUT#: 4.9 10*3/uL (ref 1.5–6.5)
NEUT%: 63.8 % (ref 38.4–76.8)
Platelets: 267 10*3/uL (ref 145–400)
RBC: 4.83 10*6/uL (ref 3.70–5.45)
RDW: 15.5 % — ABNORMAL HIGH (ref 11.2–14.5)
WBC: 7.7 10*3/uL (ref 3.9–10.3)
lymph#: 2 10*3/uL (ref 0.9–3.3)

## 2013-11-14 LAB — COMPREHENSIVE METABOLIC PANEL (CC13)
ALK PHOS: 117 U/L (ref 40–150)
ALT: 18 U/L (ref 0–55)
ANION GAP: 10 meq/L (ref 3–11)
AST: 19 U/L (ref 5–34)
Albumin: 3.9 g/dL (ref 3.5–5.0)
BILIRUBIN TOTAL: 0.51 mg/dL (ref 0.20–1.20)
BUN: 14.4 mg/dL (ref 7.0–26.0)
CO2: 25 meq/L (ref 22–29)
CREATININE: 0.8 mg/dL (ref 0.6–1.1)
Calcium: 10 mg/dL (ref 8.4–10.4)
Chloride: 108 mEq/L (ref 98–109)
Glucose: 100 mg/dl (ref 70–140)
Potassium: 4.2 mEq/L (ref 3.5–5.1)
SODIUM: 143 meq/L (ref 136–145)
TOTAL PROTEIN: 7.3 g/dL (ref 6.4–8.3)

## 2013-11-14 MED ORDER — DEXAMETHASONE 4 MG PO TABS: ORAL_TABLET | ORAL | Status: DC

## 2013-11-14 MED ORDER — PROCHLORPERAZINE MALEATE 10 MG PO TABS: ORAL_TABLET | ORAL | Status: DC

## 2013-11-14 MED ORDER — LORAZEPAM 0.5 MG PO TABS: 0.5000 mg | ORAL_TABLET | Freq: Every evening | ORAL | Status: DC | PRN

## 2013-11-14 MED ORDER — LIDOCAINE-PRILOCAINE 2.5-2.5 % EX CREA: TOPICAL_CREAM | CUTANEOUS | Status: DC

## 2013-11-14 MED ORDER — ONDANSETRON HCL 8 MG PO TABS: ORAL_TABLET | ORAL | Status: DC

## 2013-11-14 NOTE — Telephone Encounter (Signed)
, °

## 2013-11-14 NOTE — Telephone Encounter (Signed)
Called pt to inform her of lab results. No answer but left a detailed message. All labs were normal. Told the pt if she had any questions to give this nurse a call @ [phone] Message to be forwarded to [place].

## 2013-11-14 NOTE — Progress Notes (Signed)
Echocardiogram 2D Echocardiogram has been performed.  April Walker 11/14/2013, 10:45 AM

## 2013-11-14 NOTE — Progress Notes (Signed)
April Walker  Telephone:([phone]) [phone] Fax:([phone]) [phone]     ID: AYZA RIPOLL OB: 1951/03/24  MR#: [id number]  BWL#:[id number]  PCP: Criselda Peaches, MD GYN:  Vania Rea SU: Stark Klein  OTHER MD: [name], Silvano Rusk, Arther Abbott  CHIEF COMPLAINT: Left Breast Cancer/Neoadjuvant Chemotherapy   BREAST CANCER HISTORY: April Walker had routine screening mammography at [facility] 09/15/2063. This showed a possible mass in the left breast. On 10/11/2013 she underwent left diagnostic mammography and ultrasonography which confirmed an irregular mass in the upper outer left breast measuring up to 3 cm. This was firm and fixed at the 2:30 o'clock position, 6 cm from the nipple. Ultrasound showed an irregular hypoechoic mass measuring 2.8 cm. The left axilla showed no evidence of adenopathy.  On 10/11/2013 the patient underwent left breast biopsy, with the pathology [facility] [id number]) showing an invasive ductal carcinoma, high-grade, estrogen receptor 100% positive, progesterone receptor 43% positive, both with strong staining intensity, with an MIB-1 of 87% and HER-2 amplified, the signals ratio being 2.72, the number per cell 3.40.  On 10/24/2063 the patient underwent bilateral breast MRI. This showed the breast and position to be density be. In the left breast there was a 3 cm irregular enhancing mass in the upper outer quadrant. There was a 9 mm oval mass in the inner lower left breast with no definite fatty hilum. There was a 1.4 cm upper left axillary lymph node with a thickened cortex. There were no other abnormal appearing lymph nodes. Ultrasound of the left breast April 20 7.15 found of the left axillary lymph node with the mildly asymmetrical cortex, and a 7 mm her mass in the 7:30 o'clock position of the left breast. Biopsy of both these suspicious areas was performed 10/30/2013, and the preliminary report is that they're both benign.  The patient's subsequent  history is as detailed below  INTERVAL HISTORY: April Walker returns today for followup of her locally advanced left breast carcinoma. She is ready to initiate neoadjuvant therapy, her first cycle of chemotherapy scheduled for this coming Monday, May 18. The plan is to treat with 6 cycles of q. three-week docetaxel/carboplatin given with trastuzumab/pertuzumab.  Neulasta will be given on day 2 of each cycle for granulocyte support.   April Walker is doing well physically, and is ready to get started with chemotherapy next week as planned. Since her last appointment she has attended chemotherapy class and had an echocardiogram, the results of which are pending. She scheduled for port placement later this week. She's here today to discuss her treatment plan and review her antinausea medications.  REVIEW OF SYSTEMS: [name] denies any recent illnesses and has had no fevers or chills. She's had no new skin changes and denies any abnormal bleeding. Her energy level is good. Her appetite is good. She currently denies any nausea or emesis and has had no recent change in bowel or bladder habits. She has a history of hemorrhoids for which she utilizes [facility] appropriately when needed. She has occasional heartburn. She denies any cough, phlegm production, increased shortness of breath, chest pain, or palpitations. She denies any abnormal headaches or dizziness. She's had no change in vision. She currently denies any unusual myalgias, arthralgias, or bony pain, other than chronic arthritic pain which is stable. At baseline, she has no signs of peripheral neuropathy in the upper or lower extremities.  A detailed review of systems is otherwise stable and noncontributory.  And  PAST MEDICAL HISTORY: Past Medical History  Diagnosis Date  . Abnormal cholesterol test   . Hyperlipidemia   . Knee pain, left   . GERD (gastroesophageal reflux disease)   . Arthritis   . Tubulovillous adenoma of colon   . Diverticulosis   .  Internal hemorrhoids     PAST SURGICAL HISTORY: Past Surgical History  Procedure Laterality Date  . Tonsillectomy and adenoidectomy    . Right knee arthroscopy  2011 Dr. Aline Brochure  . Treatment fistula anal    . Upper gastrointestinal endoscopy    . Hemorrhoid surgery    . Colonoscopy  2002    hemorrhoids  . Colonoscopy w/ polypectomy  12/09/2010    1 cm sigmoid polyp - , diverticulosis, hemorrhoids  . Knee surgery    . Knee surgery  2012    Left knee arthroscopy  . Total knee arthroplasty  11/23/2011    Procedure: TOTAL KNEE ARTHROPLASTY;  Surgeon: Carole Civil, MD;  Location: [facility];  Service: Orthopedics;  Laterality: Left;    FAMILY HISTORY Family History  Problem Relation Age of Onset  . Adopted: Yes  . Stroke Mother   . Heart attack Father    the patient is adopted and has no information on her biological family. the patient's adoptive father died at the age of 12 from a myocardial infarction; productive mother died from a stroke at age 66. The patient does not know of any siblings  GYNECOLOGIC HISTORY: (Reviewed 11/14/2013) Menarche age 16. The patient is GX P0. She underwent menopause in her early 52s and took hormone replacement until 2010.  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 She is single, and lives at home with her black lab, April Walker, and her cats April Walker and April Walker    ADVANCED DIRECTIVES: Not in place   HEALTH MAINTENANCE:  (Updated 11/14/2013) History  Substance Use Topics  . Smoking status: Never Smoker   . Smokeless tobacco: Never Used  . Alcohol Use: Yes     Comment: socially     Colonoscopy: 2012/[name]  PAP: 2014/[name]  Bone density: not on file  Lipid panel: not on file   Allergies  Allergen Reactions  . Sulfonamide Derivatives Anaphylaxis and Hives    Pt. Stated, "large welps on neck immediately."  . Triprolidine-Pse Other (See Comments)     This is Actifed. Increased heart rate to tachycardia.    Current Outpatient Prescriptions  Medication Sig Dispense Refill  . Acetaminophen (TYLENOL EXTRA STRENGTH PO) Take by mouth.      . loratadine (CLARITIN) 10 MG tablet Take 10 mg by mouth daily as needed. For allergies      . rosuvastatin (CRESTOR) 5 MG tablet Take 5 mg by mouth daily.      Marland Kitchen dexamethasone (DECADRON) 4 MG tablet 2 tabs by mouth with food twice daily on day before and 3 days after each chemo cycle  60 tablet  1  . hydrocortisone (ANUSOL-HC) 25 MG suppository Place 1 suppository (25 mg total) rectally at bedtime as needed for hemorrhoids or itching.  10 suppository  0  . lidocaine-prilocaine (EMLA) cream Apply to port 1-2 hrs before each procedure as directed  30 g  6  . LORazepam (ATIVAN) 0.5 MG tablet Take 1 tablet (0.5 mg total) by mouth at bedtime as needed for anxiety or sleep (or nausea).  30 tablet  0  . ondansetron (ZOFRAN) 8 MG tablet 1 tab by mouth twice daily  x 3 days after chemo, then 1 tab every 12 hrs if needed for nausea  30 tablet  1  . prochlorperazine (COMPAZINE) 10 MG tablet 1 tab by mouth with meals and at bedtime x 3 days after chemo, then 1 tab by mouth every 6 hrs if needed for nausea  30 tablet  3   No current facility-administered medications for this visit.    OBJECTIVE: Middle-aged white woman in no acute distress Filed Vitals:   11/14/13 1210  BP: 144/81  Pulse: 102  Temp: 98.1 F (36.7 C)  Resp: 18     Body mass index is 33.01 kg/(m^2).    ECOG FS:0 - Asymptomatic Filed Weights   11/14/13 1210  Weight: 220 lb 4.8 oz (99.927 kg)   Physical Exam: HEENT:  Sclerae anicteric.  Oropharynx clear and moist. Neck supple, trachea midline. No thyromegaly.  NODES:  No cervical or supraclavicular lymphadenopathy palpated.  BREAST EXAM:  Right breast is unremarkable. In the left breast, there is a palpable nodule in the lateral portion of the breast measuring slightly greater than 3 cm in diameter.  No visible skin changes or nipple inversion. Axillae are benign bilaterally, with no palpable lymphadenopathy. LUNGS:  Clear to auscultation bilaterally.  No wheezes or rhonchi HEART:  Regular rate and rhythm. No murmur appreciated. ABDOMEN:  Soft, obese, nontender.  Positive bowel sounds.  MSK:  No focal spinal tenderness to palpation. Good range of motion bilaterally in the upper extremities. EXTREMITIES:  No peripheral edema.   SKIN:  No excessive ecchymoses. No petechiae. No pallor. Good skin turgor. NEURO:  Nonfocal. Well oriented.  Appropriate affect.     LAB RESULTS:   Lab Results  Component Value Date   WBC 10.0 11/26/2011   NEUTROABS 3.7 11/11/2011   HGB 10.3* 11/26/2011   HCT 31.3* 11/26/2011   MCV 81.1 11/26/2011   PLT 199 11/26/2011      Chemistry      Component Value Date/Time   NA 135 11/24/2011 0513   K 3.9 11/24/2011 0513   CL 102 11/24/2011 0513   CO2 25 11/24/2011 0513   BUN 11 11/24/2011 0513   CREATININE 0.54 11/24/2011 0513      Component Value Date/Time   CALCIUM 9.0 11/24/2011 0513       STUDIES:  Echocardiogram on 11/14/2013, results pending.     ASSESSMENT: 63 y.o. [place] woman status post left breast biopsy 10/11/2013 for a clinical T2 N0, stage IIA invasive ductal carcinoma, grade 3, estrogen and progesterone receptor positive, with an MIB-1 of 87%, and HER-2 amplified  (1) to receive neoadjuvant therapy, consisting of carboplatin, docetaxel, trastuzumab and pertuzumab every 3 weeks x6 with Neulasta day 2, starting 11/20/2013  (2) trastuzumab alone to continue to complete a year  (3) surgery to follow chemotherapy  (4) radiation to follow surgery  (5) anti-estrogens to follow radiation    PLAN: The majority of our 45 minute appointment today was spent reviewing Shawanna's  treatment plan, discussing her antinausea regimen, answering her questions, addressing her concerns, and coordinating care.   She will have her port placed on Friday as  scheduled, and will return next Monday, May 18, for day 1 cycle 1 of neoadjuvant docetaxel/carboplatin given with trastuzumab/pertuzumab. She will receive Neulasta on day 2, May 19. She's already scheduled to see Dr. Jana Hakim the following week for assessment of chemotoxicity on May 27.  Her antinausea regimen will include: Dexamethasone (8 mg by mouth with food twice daily on the day before  and 3 days after each chemotherapy); lorazepam (0.5 mg at bedtime as needed for anxiety/insomnia/nausea); ondansetron (8 mg by mouth twice daily x3 days after chemotherapy, then every 12 hours if needed for nausea); and prochlorperazine (10 mg by mouth with meals and bedtime x3 days after each chemotherapy, then every 6 hours if needed for nausea).  We also discussed the use of Claritin following the Neulasta injection, but she currently takes Claritin on a daily basis anyway, and we'll make no changes to this regimen. We discussed the use of the omeprazole if she develops increased heartburn, and if this becomes an increased problem, we will consider giving her a prescription (40 mg daily).   We also discussed the diarrhea sometimes caused with pertuzumab.  I encouraged her to keep herself very well hydrated, and to eat a somewhat bland diet for the first few days after treatment. I encouraged her to also be very aggressive if she begins to have diarrhea. She will use Imodium, 2 tablets by mouth after the first 2 loose stools. If she has a third watery stool, she will take one additional tablet of Imodium, but after a fourth watery stool will contact our office. At that time, we would likely consider prescribing Questran, and would also assess whether or not to bring her in for labs and/or IV fluids.   Jacquita was given all of the above instructions in writing, and was able to verbalize her understanding today. She does know to call, however, prior to her treatment on Monday if she has any additional  questions.  Basically, she will be seen with each cycle of chemotherapy, then 1 week after for assessment of chemotoxicity. I am scheduling her out for the next several weeks. We will plan on repeating her breast MRI after 3 cycles of neoadjuvant chemotherapy (approximately early July). She voices her understanding and agreement with all of the above and understands that the goal of treatment is cure.   Theotis Burrow, PA-C   11/14/2013 1:21 PM

## 2013-11-15 ENCOUNTER — Encounter (HOSPITAL_BASED_OUTPATIENT_CLINIC_OR_DEPARTMENT_OTHER): Payer: Self-pay | Admitting: *Deleted

## 2013-11-15 NOTE — Progress Notes (Signed)
Labs done ekg just 63 yr old

## 2013-11-17 ENCOUNTER — Encounter (HOSPITAL_BASED_OUTPATIENT_CLINIC_OR_DEPARTMENT_OTHER)
Admission: RE | Disposition: A | Discharge status: Home / Self Care | Payer: Self-pay | Source: Ambulatory Visit | Attending: General Surgery

## 2013-11-17 ENCOUNTER — Encounter (HOSPITAL_BASED_OUTPATIENT_CLINIC_OR_DEPARTMENT_OTHER): Payer: Self-pay | Admitting: *Deleted

## 2013-11-17 ENCOUNTER — Encounter (HOSPITAL_BASED_OUTPATIENT_CLINIC_OR_DEPARTMENT_OTHER): Payer: 59 | Admitting: Anesthesiology

## 2013-11-17 ENCOUNTER — Ambulatory Visit (HOSPITAL_COMMUNITY): Payer: 59

## 2013-11-17 ENCOUNTER — Ambulatory Visit (HOSPITAL_BASED_OUTPATIENT_CLINIC_OR_DEPARTMENT_OTHER): Payer: 59 | Admitting: Anesthesiology

## 2013-11-17 ENCOUNTER — Ambulatory Visit (HOSPITAL_BASED_OUTPATIENT_CLINIC_OR_DEPARTMENT_OTHER)
Admission: RE | Admit: 2013-11-17 | Discharge: 2013-11-17 | Disposition: A | Discharge status: Home / Self Care | Payer: 59 | Source: Ambulatory Visit | Attending: General Surgery | Admitting: General Surgery

## 2013-11-17 DIAGNOSIS — C50419 Malignant neoplasm of upper-outer quadrant of unspecified female breast: Secondary | ICD-10-CM | POA: Insufficient documentation

## 2013-11-17 DIAGNOSIS — Z860100 Personal history of colon polyps, unspecified: Secondary | ICD-10-CM | POA: Insufficient documentation

## 2013-11-17 DIAGNOSIS — Z888 Allergy status to other drugs, medicaments and biological substances status: Secondary | ICD-10-CM | POA: Insufficient documentation

## 2013-11-17 DIAGNOSIS — K219 Gastro-esophageal reflux disease without esophagitis: Secondary | ICD-10-CM | POA: Insufficient documentation

## 2013-11-17 DIAGNOSIS — E785 Hyperlipidemia, unspecified: Secondary | ICD-10-CM | POA: Insufficient documentation

## 2013-11-17 DIAGNOSIS — Z87891 Personal history of nicotine dependence: Secondary | ICD-10-CM | POA: Insufficient documentation

## 2013-11-17 DIAGNOSIS — C119 Malignant neoplasm of nasopharynx, unspecified: Secondary | ICD-10-CM

## 2013-11-17 DIAGNOSIS — Z882 Allergy status to sulfonamides status: Secondary | ICD-10-CM | POA: Insufficient documentation

## 2013-11-17 HISTORY — PX: PORTACATH PLACEMENT: SHX2246

## 2013-11-17 HISTORY — DX: Unspecified hearing loss, unspecified ear: H91.90

## 2013-11-17 HISTORY — DX: Presence of spectacles and contact lenses: Z97.3

## 2013-11-17 SURGERY — INSERTION, TUNNELED CENTRAL VENOUS DEVICE, WITH PORT
Anesthesia: General | Site: Chest | Laterality: Left

## 2013-11-17 MED ORDER — ONDANSETRON HCL 4 MG/2ML IJ SOLN: 4.0000 mg | Freq: Once | INTRAMUSCULAR | Status: DC | PRN

## 2013-11-17 MED ORDER — FENTANYL CITRATE 0.05 MG/ML IJ SOLN
INTRAMUSCULAR | Status: AC
  Filled 2013-11-17: qty 4

## 2013-11-17 MED ORDER — EPHEDRINE SULFATE 50 MG/ML IJ SOLN
INTRAMUSCULAR | Status: DC | PRN
  Administered 2013-11-17: 10 mg via INTRAVENOUS

## 2013-11-17 MED ORDER — ONDANSETRON HCL 4 MG/2ML IJ SOLN
INTRAMUSCULAR | Status: DC | PRN
  Administered 2013-11-17: 4 mg via INTRAVENOUS

## 2013-11-17 MED ORDER — BUPIVACAINE-EPINEPHRINE (PF) 0.5% -1:200000 IJ SOLN
INTRAMUSCULAR | Status: AC
  Filled 2013-11-17: qty 30

## 2013-11-17 MED ORDER — FENTANYL CITRATE 0.05 MG/ML IJ SOLN: 50.0000 ug | INTRAMUSCULAR | Status: DC | PRN

## 2013-11-17 MED ORDER — PROPOFOL 10 MG/ML IV BOLUS
INTRAVENOUS | Status: DC | PRN
  Administered 2013-11-17: 150 mg via INTRAVENOUS

## 2013-11-17 MED ORDER — OXYCODONE-ACETAMINOPHEN 5-325 MG PO TABS: 1.0000 | ORAL_TABLET | ORAL | Status: DC | PRN

## 2013-11-17 MED ORDER — CEFAZOLIN SODIUM-DEXTROSE 2-3 GM-% IV SOLR
2.0000 g | INTRAVENOUS | Status: AC
  Administered 2013-11-17: 2 g via INTRAVENOUS

## 2013-11-17 MED ORDER — PROPOFOL 10 MG/ML IV BOLUS
INTRAVENOUS | Status: AC
  Filled 2013-11-17: qty 20

## 2013-11-17 MED ORDER — HEPARIN (PORCINE) IN NACL 2-0.9 UNIT/ML-% IJ SOLN
INTRAMUSCULAR | Status: DC | PRN
  Administered 2013-11-17: 1 via INTRAVENOUS

## 2013-11-17 MED ORDER — BUPIVACAINE HCL (PF) 0.25 % IJ SOLN
INTRAMUSCULAR | Status: AC
  Filled 2013-11-17: qty 30

## 2013-11-17 MED ORDER — FENTANYL CITRATE 0.05 MG/ML IJ SOLN
INTRAMUSCULAR | Status: DC | PRN
  Administered 2013-11-17: 100 ug via INTRAVENOUS

## 2013-11-17 MED ORDER — OXYCODONE HCL 5 MG PO TABS: 5.0000 mg | ORAL_TABLET | Freq: Once | ORAL | Status: DC | PRN

## 2013-11-17 MED ORDER — OXYCODONE HCL 5 MG/5ML PO SOLN: 5.0000 mg | Freq: Once | ORAL | Status: DC | PRN

## 2013-11-17 MED ORDER — HYDROMORPHONE HCL PF 1 MG/ML IJ SOLN
0.2500 mg | INTRAMUSCULAR | Status: DC | PRN
  Administered 2013-11-17: 0.25 mg via INTRAVENOUS

## 2013-11-17 MED ORDER — MIDAZOLAM HCL 2 MG/2ML IJ SOLN
INTRAMUSCULAR | Status: AC
  Filled 2013-11-17: qty 2

## 2013-11-17 MED ORDER — BUPIVACAINE-EPINEPHRINE 0.5% -1:200000 IJ SOLN
INTRAMUSCULAR | Status: DC | PRN
  Administered 2013-11-17: 10 mL

## 2013-11-17 MED ORDER — LIDOCAINE HCL (CARDIAC) 20 MG/ML IV SOLN
INTRAVENOUS | Status: DC | PRN
  Administered 2013-11-17: 80 mg via INTRAVENOUS

## 2013-11-17 MED ORDER — HEPARIN SOD (PORK) LOCK FLUSH 100 UNIT/ML IV SOLN
INTRAVENOUS | Status: AC
  Filled 2013-11-17: qty 5

## 2013-11-17 MED ORDER — HEPARIN SOD (PORK) LOCK FLUSH 100 UNIT/ML IV SOLN
INTRAVENOUS | Status: DC | PRN
  Administered 2013-11-17: 500 [IU] via INTRAVENOUS

## 2013-11-17 MED ORDER — DEXAMETHASONE SODIUM PHOSPHATE 4 MG/ML IJ SOLN
INTRAMUSCULAR | Status: DC | PRN
  Administered 2013-11-17: 10 mg via INTRAVENOUS

## 2013-11-17 MED ORDER — HYDROMORPHONE HCL PF 1 MG/ML IJ SOLN
INTRAMUSCULAR | Status: AC
  Filled 2013-11-17: qty 1

## 2013-11-17 MED ORDER — LACTATED RINGERS IV SOLN
INTRAVENOUS | Status: DC
  Administered 2013-11-17 (×2): via INTRAVENOUS

## 2013-11-17 MED ORDER — CEFAZOLIN SODIUM-DEXTROSE 2-3 GM-% IV SOLR
INTRAVENOUS | Status: AC
  Filled 2013-11-17: qty 50

## 2013-11-17 MED ORDER — MIDAZOLAM HCL 2 MG/2ML IJ SOLN: 1.0000 mg | INTRAMUSCULAR | Status: DC | PRN

## 2013-11-17 MED ORDER — MIDAZOLAM HCL 5 MG/5ML IJ SOLN
INTRAMUSCULAR | Status: DC | PRN
  Administered 2013-11-17: 2 mg via INTRAVENOUS

## 2013-11-17 MED ORDER — HEPARIN (PORCINE) IN NACL 2-0.9 UNIT/ML-% IJ SOLN
INTRAMUSCULAR | Status: AC
  Filled 2013-11-17: qty 500

## 2013-11-17 SURGICAL SUPPLY — 44 items
ADH SKN CLS APL DERMABOND .7 (GAUZE/BANDAGES/DRESSINGS) ×1
BAG DECANTER FOR FLEXI CONT (MISCELLANEOUS) ×2 IMPLANT
BLADE 11 SAFETY STRL DISP (BLADE) ×1 IMPLANT
BLADE 15 SAFETY STRL DISP (BLADE) ×1 IMPLANT
BLADE HEX COATED 2.75 (ELECTRODE) ×2 IMPLANT
BLADE SURG 11 STRL SS (BLADE) ×1 IMPLANT
BLADE SURG 15 STRL LF DISP TIS (BLADE) IMPLANT
BLADE SURG 15 STRL SS (BLADE) ×2
CHLORAPREP W/TINT 26ML (MISCELLANEOUS) ×2 IMPLANT
COVER MAYO STAND STRL (DRAPES) ×2 IMPLANT
COVER TABLE BACK 60X90 (DRAPES) ×2 IMPLANT
DECANTER SPIKE VIAL GLASS SM (MISCELLANEOUS) IMPLANT
DERMABOND ADVANCED (GAUZE/BANDAGES/DRESSINGS) ×1
DERMABOND ADVANCED .7 DNX12 (GAUZE/BANDAGES/DRESSINGS) ×1 IMPLANT
DRAPE C-ARM 42X72 X-RAY (DRAPES) ×2 IMPLANT
DRAPE LAPAROTOMY TRNSV 102X78 (DRAPE) ×2 IMPLANT
DRAPE UTILITY XL STRL (DRAPES) ×2 IMPLANT
DRSG TEGADERM 4X4.75 (GAUZE/BANDAGES/DRESSINGS) ×2 IMPLANT
ELECT REM PT RETURN 9FT ADLT (ELECTROSURGICAL) ×2
ELECTRODE REM PT RTRN 9FT ADLT (ELECTROSURGICAL) ×1 IMPLANT
GLOVE BIO SURGEON STRL SZ 6 (GLOVE) ×2 IMPLANT
GLOVE BIO SURGEON STRL SZ7 (GLOVE) ×1 IMPLANT
GLOVE BIOGEL PI IND STRL 6.5 (GLOVE) ×1 IMPLANT
GLOVE BIOGEL PI IND STRL 7.5 (GLOVE) IMPLANT
GLOVE BIOGEL PI INDICATOR 6.5 (GLOVE) ×1
GLOVE BIOGEL PI INDICATOR 7.5 (GLOVE) ×1
GOWN STRL REUS W/ TWL LRG LVL3 (GOWN DISPOSABLE) ×1 IMPLANT
GOWN STRL REUS W/TWL 2XL LVL3 (GOWN DISPOSABLE) ×2 IMPLANT
GOWN STRL REUS W/TWL LRG LVL3 (GOWN DISPOSABLE) ×2
KIT PORT POWER 8FR ISP CVUE (Catheter) ×1 IMPLANT
NEEDLE HYPO 25X1 1.5 SAFETY (NEEDLE) ×2 IMPLANT
PACK BASIN DAY SURGERY FS (CUSTOM PROCEDURE TRAY) ×2 IMPLANT
PENCIL BUTTON HOLSTER BLD 10FT (ELECTRODE) ×2 IMPLANT
SLEEVE SCD COMPRESS KNEE MED (MISCELLANEOUS) ×2 IMPLANT
SUT MNCRL AB 4-0 PS2 18 (SUTURE) ×2 IMPLANT
SUT PROLENE 2 0 SH DA (SUTURE) ×4 IMPLANT
SUT VIC AB 3-0 SH 27 (SUTURE) ×2
SUT VIC AB 3-0 SH 27X BRD (SUTURE) ×1 IMPLANT
SUT VICRYL 3-0 CR8 SH (SUTURE) IMPLANT
SYR 5ML LUER SLIP (SYRINGE) ×2 IMPLANT
SYR CONTROL 10ML LL (SYRINGE) ×2 IMPLANT
SYRINGE 10CC LL (SYRINGE) ×2 IMPLANT
TOWEL OR 17X24 6PK STRL BLUE (TOWEL DISPOSABLE) ×2 IMPLANT
TOWEL OR NON WOVEN STRL DISP B (DISPOSABLE) ×2 IMPLANT

## 2013-11-17 NOTE — Op Note (Signed)
PREOPERATIVE DIAGNOSIS:  Left breast cancer     POSTOPERATIVE DIAGNOSIS:  Same     PROCEDURE: left subclavian port placement, Bard ClearVue  Power Port, MRI safe, 8-French.      SURGEON:  Stark Klein, MD      ANESTHESIA:  General   FINDINGS:  Good venous return, easy flush, and tip of the catheter and   SVC 22.5 cm.      SPECIMEN:  None.      ESTIMATED BLOOD LOSS:  Minimal.      COMPLICATIONS:  None known.      PROCEDURE:  Pt was identified in the holding area and taken to   the operating room, where patient was placed supine on the operating room   table.  General anesthesia was induced.  Patient's arms were tucked and the upper   chest and neck were prepped and draped in sterile fashion.  Time-out was   performed according to the surgical safety check list.  When all was   correct, we continued.   Local anesthetic was administered over this   area at the angle of the clavicle.  The vein was accessed with 2 passes of the needle. There was good venous return and the wire passed easily with no ectopy.   Fluoroscopy was used to confirm that the wire was in the vena cava.      The patient was placed back level and the area for the pocket was anethetized   with local anesthetic.  A 3-cm transverse incision was made with a #15   blade.  Cautery was used to divide the subcutaneous tissues down to the   pectoralis muscle.  An [employer] retractor was used to elevate the skin   while a pocket was created on top of the pectoralis fascia.  The port   was placed into the pocket to confirm that it was of adequate size.  The   catheter was preattached to the port.  The port was then secured to the   pectoralis fascia with four 2-0 Prolene sutures.  These were clamped and   not tied down yet.    The catheter was tunneled through to the wire exit   site.  The catheter was placed along the wire to determine what length it should be to be in the SVC.  The catheter was cut at 22.5 cm.  The  tunneler sheath and dilator were passed over the wire and the dilator and wire were removed.  The catheter was advanced through the tunneler sheath and the tunneler sheath was pulled away.  Care was taken to keep the catheter in the tunneler sheath as this occurred. This was advanced and the tunneler sheath was removed.  There was good venous   return and easy flush of the catheter.  The Prolene sutures were tied   down to the pectoral fascia.  The skin was reapproximated using 3-0   Vicryl interrupted deep dermal sutures.    Fluoroscopy was used to re-confirm good position of the catheter.  The skin   was then closed using 4-0 Monocryl in a subcuticular fashion.  The port was flushed with concentrated heparin flush as well.  The wounds were then cleaned, dried, and dressed with Dermabond.  The patient was awakened from anesthesia and taken to the PACU in stable condition.  Needle, sponge, and instrument counts were correct.               Stark Klein, MD

## 2013-11-17 NOTE — H&P (View-Only) (Signed)
Chief complaint:  New left breast cancer   HISTORY: [name] is seen in consultation at the request of Dr. Nyoka Walker.  This is a 63 for a female who presented with a screening detected mass in the left breast. This was around 3 cm.  She underwent an MRI which also showed an approximately 3 cm mass in the same position. There was an additional region in the left breast and a lymph node that was suspicious. These were both biopsied, and these were benign. 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 HER-2 ratio is 2.7. Ki-67 was 87%. She was not to palpate the mass before this. She has been up-to-date on her previous mammograms.  She had menarche at age 63. She is postmenopausal. She did use hormone contraception for a while but stopped in 2010. She has not had any children. She has had a colonoscopy in 2012. She is up-to-date on her Pap smear. She is a nonsmoker. She does not know any of her family history as she is adopted. She denies any other problems with her breasts.    Past Medical History  Diagnosis Date  . Abnormal cholesterol test   . Hyperlipidemia   . Knee pain, left   . GERD (gastroesophageal reflux disease)   . Arthritis   . Tubulovillous adenoma of colon   . Diverticulosis   . Internal hemorrhoids     Past Surgical History  Procedure Laterality Date  . Tonsillectomy and adenoidectomy    . Right knee arthroscopy  2011 Dr. Aline Walker  . Treatment fistula anal    . Upper gastrointestinal endoscopy    . Hemorrhoid surgery    . Colonoscopy  2002    hemorrhoids  . Colonoscopy w/ polypectomy  12/09/2010    1 cm sigmoid polyp - , diverticulosis, hemorrhoids  . Knee surgery    . Knee surgery  2012    Left knee arthroscopy  . Total knee arthroplasty  11/23/2011    Procedure: TOTAL KNEE ARTHROPLASTY;  Surgeon: April Civil, MD;  Location: [facility];  Service: Orthopedics;  Laterality:  Left;    Current Outpatient Prescriptions  Medication Sig Dispense Refill  . Acetaminophen (TYLENOL EXTRA STRENGTH PO) Take by mouth.      . hydrocortisone (ANUSOL-HC) 25 MG suppository Place 1 suppository (25 mg total) rectally at bedtime as needed for hemorrhoids or itching.  10 suppository  0  . loratadine (CLARITIN) 10 MG tablet Take 10 mg by mouth daily as needed. For allergies      . rosuvastatin (CRESTOR) 5 MG tablet Take 5 mg by mouth daily.       No current facility-administered medications for this visit.     Allergies  Allergen Reactions  . Sulfonamide Derivatives Anaphylaxis and Hives    Pt. Stated, "large welps on neck immediately."  . Triprolidine-Pse Other (See Comments)    This is Actifed. Increased heart rate to tachycardia.     Family History  Problem Relation Age of Onset  . Adopted: Yes  . Stroke Mother   . Heart attack Father      History   Social History  . Marital Status: Single    Spouse Name: N/A    Number of Children: N/A  . Years of Education: college   Occupational History  . echo tech    Social History Main Topics  . Smoking status: Never Smoker   .  Smokeless tobacco: Never Used  . Alcohol Use: Yes     Comment: socially  . Drug Use: No  . Sexual Activity: Not on file   Other Topics Concern  . Not on file   Social History Narrative  . No narrative on file     REVIEW OF SYSTEMS - PERTINENT POSITIVES ONLY: 12 point review of systems negative other than HPI and PMH except for hamstring pain, fatigue, hearing loss, hemorrhoids  EXAM: Wt Readings from Last 3 Encounters:  11/01/13 218 lb 12.8 oz (99.247 kg)  08/08/13 217 lb (98.431 kg)  12/15/12 215 lb (97.523 kg)   Temp Readings from Last 3 Encounters:  11/01/13 98.8 F (37.1 C) Oral  10/11/13 97.6 F (36.4 C) Oral  11/26/11 98.1 F (36.7 C) Oral   BP Readings from Last 3 Encounters:  11/01/13 146/85  10/11/13 131/83  08/08/13 144/82   Pulse Readings from Last 3  Encounters:  11/01/13 80  10/11/13 83  08/08/13 86     Wt Readings from Last 3 Encounters:  11/01/13 218 lb 12.8 oz (99.247 kg)  08/08/13 217 lb (98.431 kg)  12/15/12 215 lb (97.523 kg)     Gen:  No acute distress.  Well nourished and well groomed.   Neurological: Alert and oriented to person, place, and time. Coordination normal.  Head: Normocephalic and atraumatic.  Eyes: Conjunctivae are normal. Pupils are equal, round, and reactive to light. No scleral icterus.  Neck: Normal range of motion. Neck supple. No tracheal deviation or thyromegaly present.  Cardiovascular: Normal rate, regular rhythm, normal heart sounds and intact distal pulses.  Exam reveals no gallop and no friction rub.  No murmur heard. Breast: palpable mass at 3 o'clock on left.  1 cm palpable LN.  No nipple retraction or skin dimpling.  No other masses.  Relatively dense breasts.  No nipple retraction.   Respiratory: Effort normal.  No respiratory distress. No chest wall tenderness. Breath sounds normal.  No wheezes, rales or rhonchi.  GI: Soft. Bowel sounds are normal. The abdomen is soft and nontender.  There is no rebound and no guarding.  Musculoskeletal: Normal range of motion. Extremities are nontender.  Lymphadenopathy: No cervical, preauricular, postauricular or axillary adenopathy is present Skin: Skin is warm and dry. No rash noted. No diaphoresis. No erythema. No pallor. No clubbing, cyanosis, or edema.   Psychiatric: Normal mood and affect. Behavior is normal. Judgment and thought content normal.    LABORATORY RESULTS: Available labs are reviewed   Pathology Diagnosis Breast, left, needle core biopsy, 2:30 - INVASIVE DUCTAL CARCINOMA. - PLEASE SEE COMMENT.  RADIOLOGY RESULTS: See E-Chart or I-Site for most recent results.  Images and reports are reviewed.  Mr Breast Bilateral W Wo Contrast  10/24/2013   CLINICAL DATA:  63 year old female with newly diagnosed left breast cancer.  LABS:   Performed at outside institution  EXAM: BILATERAL BREAST MRI WITH AND WITHOUT CONTRAST  TECHNIQUE: Multiplanar, multisequence MR images of both breasts were obtained prior to and following the intravenous administration of 56m of MultiHance.  THREE-DIMENSIONAL MR IMAGE RENDERING ON INDEPENDENT WORKSTATION:  Three-dimensional MR images were rendered by post-processing of the original MR data on an independent workstation. The three-dimensional MR images were interpreted, and findings are reported in the following complete MRI report for this study. Three dimensional images were evaluated at the independent DynaCad workstation  COMPARISON:  Previous exams  FINDINGS: Breast composition: b.  Scattered fibroglandular tissue.  Background parenchymal enhancement: Mild  Right breast:  No mass or abnormal enhancement.  Left breast: A 3 cm irregular enhancing mass with rapid washout kinetics is identified within the upper-outer left breast (junction of the middle posterior thirds) and contains biopsy clip artifact -compatible with biopsy-proven neoplasm.  A 5 x 9 mm circumscribed oval mass within the inner lower left breast (anterior third) exhibits plateau type kinetics and is indeterminate. No definite fatty hilum is noted.  Lymph nodes: A 1.4 cm upper left axillary lymph node has an asymmetrically thickened cortex medially (best seen on image [id number] 165 on axial contrast images) and suspicious for lymphatic tumor spread.  No other abnormal appearing lymph nodes are identified.  Ancillary findings:  Mild cardiomegaly identified.  IMPRESSION: 3 cm biopsy-proven neoplasm within the upper outer left breast. Asymmetric cortical thickening of up upper left axillary lymph node suspicious for lymphatic tumor spread. Second-look left axillary ultrasound and possible biopsy is recommended.  Indeterminate 5 x 9 mm circumscribed oval mass within the inner lower left breast. Although this could represent an intraparenchymal lymph node or  cystic changes, left breast ultrasound and possible biopsy is recommended for further evaluation.  No evidence of contralateral neoplasm.  RECOMMENDATION: Second look left breast and left axillary ultrasound with possible biopsies. This ultrasound will be arranged by or office.  BI-RADS CATEGORY  4: Suspicious.   Electronically Signed   By: Hassan Rowan M.D.   On: 10/24/2013 11:34   [place] Core Biopsy  10/17/2013   CLINICAL DATA:  Left breast mass.  EXAM: ULTRASOUND GUIDED LEFT BREAST CORE NEEDLE BIOPSY WITH VACUUM ASSIST  COMPARISON:  Previous exams.  PROCEDURE: I met with the patient and we discussed the procedure of ultrasound-guided biopsy, including benefits and alternatives. We discussed the high likelihood of a successful procedure. We discussed the risks of the procedure including infection, bleeding, tissue injury, clip migration, and inadequate sampling. Informed written consent was given. The usual time-out protocol was performed immediately prior to the procedure.  Using sterile technique and 2% Lidocaine as local anesthetic, under direct ultrasound visualization, a 12 gauge vacuum-assisted device was used to perform biopsy of left breast mass at 230, 6 cm from the nipple, using a inferior to superior approach. At the conclusion of the procedure, a ribbon shaped tissue marker clip was deployed into the biopsy cavity. Follow-up 2-view mammogram was performed and dictated separately.  IMPRESSION: Pathology demonstrates high grade invasive ductal carcinoma. These results are concordant. Surgical consultation and MRI is recommended. We will contact the patient regarding an appointment for a breast MRI. The patient wishes to speak with her OBGYN regarding a surgical referral. The patient was notified of results and recommendations on 10/13/2013. She reports soreness of the biopsy site. Otherwise, she has no complaints.  ADDENDUM: Pathology demonstrates high grade invasive ductal carcinoma. These results are  concordant. Surgical consultation and MRI is recommended. We will contact the patient regarding an appointment for a breast MRI. The patient wishes to speak with her OBGYN regarding a surgical referral. The patient was notified of results and recommendations on 10/13/2013. She reports soreness of the biopsy site. Otherwise, she has no complaints.   Electronically Signed   By: Donavan Burnet M.D.   On: 10/17/2013 10:05   Mm Digital Diagnostic Unilat L  10/30/2013   CLINICAL DATA:  Status post ultrasound-guided core needle biopsy of a 7 mm mass in the 7:30 o'clock position of the left breast.  EXAM: POST-BIOPSY CLIP PLACEMENT LEFT DIAGNOSTIC MAMMOGRAM  COMPARISON:  Previous exams.  FINDINGS: Films are  performed following ultrasound guided biopsy of a 7 mm mass in the 7:30 o'clock position of the left breast. These demonstrate a coil shaped biopsy marker clip at the expected location of the biopsied mass. There is also a ribbon shaped clip within the previously biopsied invasive ductal carcinoma in the upper outer left breast. The clips are located 7 cm apart.  IMPRESSION: Appropriate clip deployment following left breast ultrasound-guided core needle biopsy.  Final Assessment: Post Procedure Mammograms for Marker Placement   Electronically Signed   By: Enrique Sack M.D.   On: 10/30/2013 14:42   Mm Digital Diagnostic Unilat L  10/11/2013   CLINICAL DATA:  Abnormal screening mammogram.  EXAM: DIGITAL DIAGNOSTIC  LEFT MAMMOGRAM WITH CAD  ULTRASOUND LEFT BREAST  COMPARISON:  Multiple priors  ACR Breast Density Category b: There are scattered areas of fibroglandular density.  FINDINGS: An irregular mass is identified in the upper-outer left breast, measuring up to 3 cm.  Mammogram following an ultrasound-guided biopsy of the left breast mass at 230, 6 cm from the nipple, demonstrates satisfactory placement of a ribbon shaped clip.  Mammographic images were processed with CAD.  On physical exam, there is a firm  fixed mass at 230, 6 cm from the nipple.  Targeted ultrasound demonstrates an irregular hypoechoic mass with mild posterior acoustic shadowing at 230, 6 cm from the nipple. It measures 2.8 x 1.9 x 3 cm. Evaluation of the left axilla is negative for adenopathy.  IMPRESSION: 1. Suspicious left breast mass. 2. Satisfactory clip placement following an ultrasound-guided biopsy.  RECOMMENDATION: Ultrasound-guided biopsy of the left breast mass. This was performed the same date as the patient's diagnostic examination.  I have discussed the findings and recommendations with the patient. Results were also provided in writing at the conclusion of the visit. If applicable, a reminder letter will be sent to the patient regarding the next appointment.  BI-RADS CATEGORY  5: Highly suggestive of malignancy - appropriate action should be taken.   Electronically Signed   By: Donavan Burnet M.D.   On: 10/11/2013 15:34   US Breast Ltd Uni Left Inc Axilla  10/30/2013   CLINICAL DATA:  Recently diagnosed left breast upper outer quadrant invasive ductal carcinoma. Additional 9 mm mass seen in the lower inner quadrant of the left breast on a recent staging MRI. There was also a mildly abnormal appearing left axillary lymph node on the MRI.  EXAM: ULTRASOUND OF THE LEFT BREAST  COMPARISON:  Previous examinations, including the bilateral breast MRI dated 10/23/2013.  FINDINGS: On physical exam,no mass is palpable in the left axilla or lower inner left breast.  Ultrasound is performed, showing a left axillary lymph node with mild asymmetrical cortical thickening. There is also 7 x 6 x 4 mm mildly irregular, hypoechoic, obliquely oriented mass in the 7:30 o'clock position of the left breast, 4 cm from the nipple. This is in the region of the 9 mm mass seen on the MRI.  IMPRESSION: 7 mm indeterminate mass in the 7:30 o'clock position of the left breast and mildly abnormal appearing left axillary lymph node. Ultrasound-guided core  needle biopsies of the mass and lymph node are recommended and scheduled to follow.  RECOMMENDATION: Left breast and left axillary lymph node ultrasound-guided core needle biopsies (scheduled follow).  I have discussed the findings and recommendations with the patient. Results were also provided in writing at the conclusion of the visit. If applicable, a reminder letter will be sent to the patient regarding the  next appointment.  BI-RADS CATEGORY  4: Suspicious.   Electronically Signed   By: Enrique Sack M.D.   On: 10/30/2013 14:31   US Breast Ltd Uni Left Inc Axilla  10/11/2013   CLINICAL DATA:  Abnormal screening mammogram.  EXAM: DIGITAL DIAGNOSTIC  LEFT MAMMOGRAM WITH CAD  ULTRASOUND LEFT BREAST  COMPARISON:  Multiple priors  ACR Breast Density Category b: There are scattered areas of fibroglandular density.  FINDINGS: An irregular mass is identified in the upper-outer left breast, measuring up to 3 cm.  Mammogram following an ultrasound-guided biopsy of the left breast mass at 230, 6 cm from the nipple, demonstrates satisfactory placement of a ribbon shaped clip.  Mammographic images were processed with CAD.  On physical exam, there is a firm fixed mass at 230, 6 cm from the nipple.  Targeted ultrasound demonstrates an irregular hypoechoic mass with mild posterior acoustic shadowing at 230, 6 cm from the nipple. It measures 2.8 x 1.9 x 3 cm. Evaluation of the left axilla is negative for adenopathy.  IMPRESSION: 1. Suspicious left breast mass. 2. Satisfactory clip placement following an ultrasound-guided biopsy.  RECOMMENDATION: Ultrasound-guided biopsy of the left breast mass. This was performed the same date as the patient's diagnostic examination.  I have discussed the findings and recommendations with the patient. Results were also provided in writing at the conclusion of the visit. If applicable, a reminder letter will be sent to the patient regarding the next appointment.  BI-RADS CATEGORY  5: Highly  suggestive of malignancy - appropriate action should be taken.   Electronically Signed   By: Donavan Burnet M.D.   On: 10/11/2013 15:34   [place] Lt Breast Bx W Loc Dev 1st Lesion Img Bx Spec US Guide  11/01/2013   ADDENDUM REPORT: 11/01/2013 08:23  ADDENDUM: The final pathological diagnosis is benign breast tissue and focal fibrocystic changes. This is concordant with the imaging findings. The final pathological diagnosis was discussed with the patient by telephone on 10/31/2013. Her questions were answered. She reported a small amount of pain and bruising at the biopsy site with no palpable hematoma.   Electronically Signed   By: Enrique Sack M.D.   On: 11/01/2013 08:23   11/01/2013   CLINICAL DATA:  7 mm of the indeterminate mass in the 7:30 o'clock position of the left breast at recent ultrasound and MRI.  EXAM: ULTRASOUND GUIDED LEFT BREAST CORE NEEDLE BIOPSY  COMPARISON:  Previous exams.  PROCEDURE: I met with the patient and we discussed the procedure of ultrasound-guided biopsy, including benefits and alternatives. We discussed the high likelihood of a successful procedure. We discussed the risks of the procedure including infection, bleeding, tissue injury, clip migration, and inadequate sampling. Informed written consent was given. The usual time-out protocol was performed immediately prior to the procedure.  Using sterile technique and 2% Lidocaine as local anesthetic, under direct ultrasound visualization, a 12 gauge vacuum-assisteddevice was used to perform biopsy of the recently demonstrated 7 mm mass in the 7:30 o'clock position of the left breast, 4 cm from the nipple, using a lateral approach. At the conclusion of the procedure, a coil shaped tissue marker clip was deployed into the biopsy cavity. Follow-up 2-view mammogram was performed and dictated separately.  IMPRESSION: Ultrasound-guided biopsy of a 7 mm mass in the 7:30 o'clock position of the left breast. No apparent complications.   Electronically Signed: By: Enrique Sack M.D. On: 10/30/2013 14:32   [place] Lt Breast Bx W Loc Dev 1st Lesion Img  Bx Spec US Guide  10/13/2013   ADDENDUM REPORT: 10/13/2013 12:35  ADDENDUM: Pathology demonstrates high grade invasive ductal carcinoma. These results are concordant. Surgical consultation and MRI is recommended. We will contact the patient regarding an appointment for a breast MRI. The patient wishes to speak with her OBGYN regarding a surgical referral. The patient was notified of results and recommendations on 10/13/2013. She reports soreness of the biopsy site. Otherwise, she has no complaints.   Electronically Signed   By: Donavan Burnet M.D.   On: 10/13/2013 12:35   10/13/2013   CLINICAL DATA:  Left breast mass.  EXAM: ULTRASOUND GUIDED LEFT BREAST CORE NEEDLE BIOPSY WITH VACUUM ASSIST  COMPARISON:  Previous exams.  PROCEDURE: I met with the patient and we discussed the procedure of ultrasound-guided biopsy, including benefits and alternatives. We discussed the high likelihood of a successful procedure. We discussed the risks of the procedure including infection, bleeding, tissue injury, clip migration, and inadequate sampling. Informed written consent was given. The usual time-out protocol was performed immediately prior to the procedure.  Using sterile technique and 2% Lidocaine as local anesthetic, under direct ultrasound visualization, a 12 gauge vacuum-assisteddevice was used to perform biopsy of left breast mass at 230, 6 cm from the nipple, using a inferior to superior approach. At the conclusion of the procedure, a ribbon shaped tissue marker clip was deployed into the biopsy cavity. Follow-up 2-view mammogram was performed and dictated separately.  IMPRESSION: Ultrasound-guided biopsy of a left breast mass. No apparent complications.  Electronically Signed: By: Donavan Burnet M.D. On: 10/11/2013 15:35   [place] Lt Breast Bx W Loc Dev Ea Add Lesion Img Bx Spec US Guide  11/01/2013    ADDENDUM REPORT: 11/01/2013 08:24  ADDENDUM: The final pathological diagnosis is benign lymph node. This is concordant with the imaging findings. The final pathological diagnosis was discussed with the patient by telephone on 10/31/2013. Her questions were answered. She reported a small amount of pain and bruising at the biopsy site with no palpable hematoma.   Electronically Signed   By: Enrique Sack M.D.   On: 11/01/2013 08:24   11/01/2013   CLINICAL DATA:  Mildly abnormal appearing left axillary lymph node at recent MRI and ultrasound. Recently diagnosed invasive ductal carcinoma in the upper-outer quadrant of the left breast.  EXAM: ULTRASOUND GUIDED CORE NEEDLE BIOPSY OF A LEFT AXILLARY NODE  COMPARISON:  Previous exams.  FINDINGS: I met with the patient and we discussed the procedure of ultrasound-guided biopsy, including benefits and alternatives. We discussed the high likelihood of a successful procedure. We discussed the risks of the procedure, including infection, bleeding, tissue injury, clip migration, and inadequate sampling. Informed written consent was given. The usual time-out protocol was performed immediately prior to the procedure.  Using sterile technique and 2% Lidocaine as local anesthetic, under direct ultrasound visualization, a 14 gauge spring-loaded device was used to perform biopsy of the recently demonstrated mildly abnormal appearing left axillary lymph node using an inferior approach.  IMPRESSION: Ultrasound guided biopsy of a mildly abnormal appearing left axillary lymph node. No apparent complications.  Electronically Signed: By: Enrique Sack M.D. On: 10/30/2013 14:34      ASSESSMENT AND PLAN: Breast cancer of upper-outer quadrant of left female breast Pt has new diagnosis of cT2N0 left breast cancer that is Her-2 positive.   Given the size of the mass, we will plan neoadjuvant chemotherapy.  I will place port a cath for this.  She will do her staging  studies and chemo class.     I reviewed port a cath placement including incision, process, and risks.  We reviewed the risks of bleeding, infection, malfunction, pneumothorax, need for additional procedures or surgeries.   I discussed recovery time and restrictions.    We will plan follow up in the middle of chemo with MRI.  Our plan would be to do breast conservation with lumpectomy and axillary SLN bx.  We also discussed following BCT with radiation and antihormone treatment.    45 min spent in evaluation, examination, counseling, and coordination of care.       Milus Height MD Surgical Oncology, [facility]      Visit Diagnoses: 1. Breast cancer of upper-outer quadrant of left female breast     Primary Care Physician: Criselda Peaches, MD [name] [place].

## 2013-11-17 NOTE — Interval H&P Note (Signed)
History and Physical Interval Note:  11/17/2013 7:24 AM  April Walker  has presented today for surgery, with the diagnosis of left breast cancer  The various methods of treatment have been discussed with the patient and family. After consideration of risks, benefits and other options for treatment, the patient has consented to  Procedure(s): INSERTION PORT-A-CATH (N/A) as a surgical intervention .  The patient's history has been reviewed, patient examined, no change in status, stable for surgery.  I have reviewed the patient's chart and labs.  Questions were answered to the patient's satisfaction.     Stark Klein

## 2013-11-17 NOTE — Anesthesia Procedure Notes (Signed)
Procedure Name: LMA Insertion Date/Time: 11/17/2013 7:43 AM Performed by: Lyndee Leo Pre-anesthesia Checklist: Patient identified, Emergency Drugs available, Suction available and Patient being monitored Patient Re-evaluated:Patient Re-evaluated prior to inductionOxygen Delivery Method: Circle System Utilized Preoxygenation: Pre-oxygenation with 100% oxygen Intubation Type: IV induction Ventilation: Mask ventilation without difficulty LMA: LMA inserted LMA Size: 4.0 Number of attempts: 1 Airway Equipment and Method: bite block Placement Confirmation: positive ETCO2 Tube secured with: Tape Dental Injury: Teeth and Oropharynx as per pre-operative assessment

## 2013-11-17 NOTE — Anesthesia Preprocedure Evaluation (Signed)
Anesthesia Evaluation  Patient identified by MRN, date of birth, ID band Patient awake    Reviewed: Allergy & Precautions, H&P , NPO status , Patient's Chart, lab work & pertinent test results  Airway Mallampati: I  TM Distance: >3 FB Neck ROM: Full    Dental  (+) Teeth Intact, Dental Advisory Given   Pulmonary  breath sounds clear to auscultation        Cardiovascular Rhythm:Regular Rate:Normal     Neuro/Psych    GI/Hepatic GERD-  Medicated and Controlled,  Endo/Other    Renal/GU      Musculoskeletal   Abdominal   Peds  Hematology   Anesthesia Other Findings   Reproductive/Obstetrics                             Anesthesia Physical Anesthesia Plan  ASA: II  Anesthesia Plan: General   Post-op Pain Management:    Induction: Intravenous  Airway Management Planned: LMA  Additional Equipment:   Intra-op Plan:   Post-operative Plan: Extubation in OR  Informed Consent: I have reviewed the patients History and Physical, chart, labs and discussed the procedure including the risks, benefits and alternatives for the proposed anesthesia with the patient or authorized representative who has indicated his/her understanding and acceptance.   Dental advisory given  Plan Discussed with: CRNA, Surgeon and Anesthesiologist  Anesthesia Plan Comments:         Anesthesia Quick Evaluation  

## 2013-11-17 NOTE — Discharge Instructions (Addendum)
[facility] °Office Phone Number [phone] ° ° POST OP INSTRUCTIONS ° °Always review your discharge instruction sheet given to you by the facility where your surgery was performed. ° °IF YOU HAVE DISABILITY OR FAMILY LEAVE FORMS, YOU MUST BRING THEM TO THE OFFICE FOR PROCESSING.  DO NOT GIVE THEM TO YOUR DOCTOR. ° °1. A prescription for pain medication may be given to you upon discharge.  Take your pain medication as prescribed, if needed.  If narcotic pain medicine is not needed, then you may take acetaminophen (Tylenol) or ibuprofen (Advil) as needed. °2. Take your usually prescribed medications unless otherwise directed °3. If you need a refill on your pain medication, please contact your pharmacy.  They will contact our office to request authorization.  Prescriptions will not be filled after 5pm or on week-ends. °4. You should eat very light the first 24 hours after surgery, such as soup, crackers, pudding, etc.  Resume your normal diet the day after surgery °5. It is common to experience some constipation if taking pain medication after surgery.  Increasing fluid intake and taking a stool softener will usually help or prevent this problem from occurring.  A mild laxative (Milk of Magnesia or Miralax) should be taken according to package directions if there are no bowel movements after 48 hours. °6. You may shower in 48 hours.  The surgical glue will flake off in 2-3 weeks.   °7. ACTIVITIES:  No strenuous activity or heavy lifting for 1 week.   °a. You may drive when you no longer are taking prescription pain medication, you can comfortably wear a seatbelt, and you can safely maneuver your car and apply brakes. °b. RETURN TO WORK:  __________to be determined_______________ °You should see your doctor in the office for a follow-up appointment approximately three-four weeks after your surgery.   ° °WHEN TO CALL YOUR DOCTOR: °1. Fever over 101.0 °2. Nausea and/or vomiting. °3. Extreme swelling or  bruising. °4. Continued bleeding from incision. °5. Increased pain, redness, or drainage from the incision. ° °The clinic staff is available to answer your questions during regular business hours.  Please don’t hesitate to call and ask to speak to one of the nurses for clinical concerns.  If you have a medical emergency, go to the nearest emergency room or call 911.  A surgeon from [facility] is always on call at the hospital. ° °For further questions, please visit [email or web address]  ° ° ° ° °Post Anesthesia Home Care Instructions ° °Activity: °Get plenty of rest for the remainder of the day. A responsible adult should stay with you for 24 hours following the procedure.  °For the next 24 hours, DO NOT: °-Drive a car °-Operate machinery °-Drink alcoholic beverages °-Take any medication unless instructed by your physician °-Make any legal decisions or sign important papers. ° °Meals: °Start with liquid foods such as gelatin or soup. Progress to regular foods as tolerated. Avoid greasy, spicy, heavy foods. If nausea and/or vomiting occur, drink only clear liquids until the nausea and/or vomiting subsides. Call your physician if vomiting continues. ° °Special Instructions/Symptoms: °Your throat may feel dry or sore from the anesthesia or the breathing tube placed in your throat during surgery. If this causes discomfort, gargle with warm salt water. The discomfort should disappear within 24 hours. ° °

## 2013-11-17 NOTE — Transfer of Care (Signed)
Immediate Anesthesia Transfer of Care Note  Patient: April Walker  Procedure(s) Performed: Procedure(s): INSERTION PORT-A-CATH (Left)  Patient Location: PACU  Anesthesia Type:General  Level of Consciousness: awake, oriented and patient cooperative  Airway & Oxygen Therapy: Patient Spontanous Breathing and Patient connected to face mask oxygen  Post-op Assessment: Report given to PACU RN and Post -op Vital signs reviewed and stable  Post vital signs: Reviewed and stable  Complications: No apparent anesthesia complications

## 2013-11-17 NOTE — Anesthesia Postprocedure Evaluation (Signed)
  Anesthesia Post-op Note  Patient: April Walker  Procedure(s) Performed: Procedure(s): INSERTION PORT-A-CATH (Left)  Patient Location: PACU  Anesthesia Type:General  Level of Consciousness: awake, alert  and oriented  Airway and Oxygen Therapy: Patient Spontanous Breathing  Post-op Pain: mild  Post-op Assessment: Post-op Vital signs reviewed  Post-op Vital Signs: Reviewed  Last Vitals:  Filed Vitals:   11/17/13 0845  BP: 154/77  Pulse: 77  Temp:   Resp: 15    Complications: No apparent anesthesia complications

## 2013-11-20 ENCOUNTER — Ambulatory Visit (HOSPITAL_BASED_OUTPATIENT_CLINIC_OR_DEPARTMENT_OTHER): Payer: Medicare Other

## 2013-11-20 VITALS — BP 151/81 | HR 78 | Temp 97.7°F | Resp 16

## 2013-11-20 DIAGNOSIS — C50412 Malignant neoplasm of upper-outer quadrant of left female breast: Secondary | ICD-10-CM

## 2013-11-20 DIAGNOSIS — Z5112 Encounter for antineoplastic immunotherapy: Secondary | ICD-10-CM

## 2013-11-20 DIAGNOSIS — C50419 Malignant neoplasm of upper-outer quadrant of unspecified female breast: Secondary | ICD-10-CM

## 2013-11-20 DIAGNOSIS — Z5111 Encounter for antineoplastic chemotherapy: Secondary | ICD-10-CM

## 2013-11-20 MED ORDER — SODIUM CHLORIDE 0.9 % IJ SOLN
10.0000 mL | INTRAMUSCULAR | Status: DC | PRN
  Filled 2013-11-20: qty 10

## 2013-11-20 MED ORDER — ACETAMINOPHEN 325 MG PO TABS
ORAL_TABLET | ORAL | Status: AC
  Filled 2013-11-20: qty 2

## 2013-11-20 MED ORDER — DEXAMETHASONE SODIUM PHOSPHATE 20 MG/5ML IJ SOLN
20.0000 mg | Freq: Once | INTRAMUSCULAR | Status: AC
  Administered 2013-11-20: 20 mg via INTRAVENOUS

## 2013-11-20 MED ORDER — DIPHENHYDRAMINE HCL 25 MG PO CAPS
25.0000 mg | ORAL_CAPSULE | Freq: Once | ORAL | Status: AC
  Administered 2013-11-20: 25 mg via ORAL

## 2013-11-20 MED ORDER — SODIUM CHLORIDE 0.9 % IV SOLN
Freq: Once | INTRAVENOUS | Status: AC
  Administered 2013-11-20: 09:00:00 via INTRAVENOUS

## 2013-11-20 MED ORDER — DEXAMETHASONE SODIUM PHOSPHATE 20 MG/5ML IJ SOLN
INTRAMUSCULAR | Status: AC
  Filled 2013-11-20: qty 5

## 2013-11-20 MED ORDER — ACETAMINOPHEN 325 MG PO TABS
650.0000 mg | ORAL_TABLET | Freq: Once | ORAL | Status: AC
  Administered 2013-11-20: 650 mg via ORAL

## 2013-11-20 MED ORDER — SODIUM CHLORIDE 0.9 % IV SOLN
681.5000 mg | Freq: Once | INTRAVENOUS | Status: AC
  Administered 2013-11-20: 680 mg via INTRAVENOUS
  Filled 2013-11-20: qty 68

## 2013-11-20 MED ORDER — SODIUM CHLORIDE 0.9 % IV SOLN
840.0000 mg | Freq: Once | INTRAVENOUS | Status: AC
  Administered 2013-11-20: 840 mg via INTRAVENOUS
  Filled 2013-11-20: qty 28

## 2013-11-20 MED ORDER — TRASTUZUMAB CHEMO INJECTION 440 MG
8.0000 mg/kg | Freq: Once | INTRAVENOUS | Status: AC
  Administered 2013-11-20: 798 mg via INTRAVENOUS
  Filled 2013-11-20: qty 38

## 2013-11-20 MED ORDER — DIPHENHYDRAMINE HCL 25 MG PO CAPS
ORAL_CAPSULE | ORAL | Status: AC
  Filled 2013-11-20: qty 1

## 2013-11-20 MED ORDER — ONDANSETRON 16 MG/50ML IVPB (CHCC)
16.0000 mg | Freq: Once | INTRAVENOUS | Status: AC
  Administered 2013-11-20: 16 mg via INTRAVENOUS

## 2013-11-20 MED ORDER — HEPARIN SOD (PORK) LOCK FLUSH 100 UNIT/ML IV SOLN
500.0000 [IU] | Freq: Once | INTRAVENOUS | Status: DC | PRN
  Filled 2013-11-20: qty 5

## 2013-11-20 MED ORDER — DOCETAXEL CHEMO INJECTION 160 MG/16ML
75.0000 mg/m2 | Freq: Once | INTRAVENOUS | Status: AC
  Administered 2013-11-20: 160 mg via INTRAVENOUS
  Filled 2013-11-20: qty 16

## 2013-11-20 MED ORDER — ONDANSETRON 16 MG/50ML IVPB (CHCC)
INTRAVENOUS | Status: AC
  Filled 2013-11-20: qty 16

## 2013-11-20 NOTE — Patient Instructions (Signed)
[facility] Discharge Instructions for Patients Receiving Chemotherapy  Today you received the following chemotherapy agents Herceptin, Perjeta, Taxotere, Carboplatin.  To help prevent nausea and vomiting after your treatment, we encourage you to take your nausea medication as prescribed.   If you develop nausea and vomiting that is not controlled by your nausea medication, call the clinic.   BELOW ARE SYMPTOMS THAT SHOULD BE REPORTED IMMEDIATELY:  *FEVER GREATER THAN 100.5 F  *CHILLS WITH OR WITHOUT FEVER  NAUSEA AND VOMITING THAT IS NOT CONTROLLED WITH YOUR NAUSEA MEDICATION  *UNUSUAL SHORTNESS OF BREATH  *UNUSUAL BRUISING OR BLEEDING  TENDERNESS IN MOUTH AND THROAT WITH OR WITHOUT PRESENCE OF ULCERS  *URINARY PROBLEMS  *BOWEL PROBLEMS  UNUSUAL RASH Items with * indicate a potential emergency and should be followed up as soon as possible.  Feel free to call the clinic you have any questions or concerns. The clinic phone number is [phone].    

## 2013-11-20 NOTE — Progress Notes (Signed)
While de-accessing port, medial portion of glue peeled from incision site with adhesive dressing. Site still well approximated, no drainage noted. Non- adherent dressing applied. Pt given instructions to keep dry and intact for 24 hours. She voiced understanding.

## 2013-11-21 ENCOUNTER — Ambulatory Visit (HOSPITAL_BASED_OUTPATIENT_CLINIC_OR_DEPARTMENT_OTHER): Payer: 59

## 2013-11-21 ENCOUNTER — Telehealth: Payer: Self-pay | Admitting: *Deleted

## 2013-11-21 VITALS — BP 155/80 | HR 67 | Temp 98.4°F

## 2013-11-21 DIAGNOSIS — C50419 Malignant neoplasm of upper-outer quadrant of unspecified female breast: Secondary | ICD-10-CM

## 2013-11-21 DIAGNOSIS — Z5189 Encounter for other specified aftercare: Secondary | ICD-10-CM

## 2013-11-21 DIAGNOSIS — C50412 Malignant neoplasm of upper-outer quadrant of left female breast: Secondary | ICD-10-CM

## 2013-11-21 MED ORDER — PEGFILGRASTIM INJECTION 6 MG/0.6ML
6.0000 mg | Freq: Once | SUBCUTANEOUS | Status: AC
  Administered 2013-11-21: 6 mg via SUBCUTANEOUS
  Filled 2013-11-21: qty 0.6

## 2013-11-21 NOTE — Telephone Encounter (Signed)
Message copied by Cherylynn Ridges on Tue Nov 21, 2013  5:51 PM ------      Message from: Christa See      Created: Mon Nov 20, 2013 10:21 AM      Regarding: First time chemo f/u call      Contact: [phone]       Pt received herceptin, perjeta, taxotere, carbo on Monday. MD is magrinat. Can you call and see how she is doing? Thanks! Kristen  ------

## 2013-11-21 NOTE — Telephone Encounter (Signed)
Called Mahkayla Preece Chachere at [phone] number(s).  Message left requesting a return call for chemotherapy follow up.  Awaiting return call from patient.

## 2013-11-23 ENCOUNTER — Encounter (HOSPITAL_BASED_OUTPATIENT_CLINIC_OR_DEPARTMENT_OTHER): Payer: Self-pay | Admitting: General Surgery

## 2013-11-29 ENCOUNTER — Ambulatory Visit (HOSPITAL_BASED_OUTPATIENT_CLINIC_OR_DEPARTMENT_OTHER): Payer: Medicare Other | Admitting: Oncology

## 2013-11-29 VITALS — BP 134/83 | HR 106 | Temp 98.3°F | Resp 18 | Ht 68.0 in | Wt 212.3 lb

## 2013-11-29 DIAGNOSIS — C50412 Malignant neoplasm of upper-outer quadrant of left female breast: Secondary | ICD-10-CM

## 2013-11-29 DIAGNOSIS — C50419 Malignant neoplasm of upper-outer quadrant of unspecified female breast: Secondary | ICD-10-CM

## 2013-11-29 MED ORDER — CHOLESTYRAMINE 4 GM/DOSE PO POWD: 1.0000 g | Freq: Two times a day (BID) | ORAL | Status: DC | PRN

## 2013-11-29 NOTE — Progress Notes (Signed)
[place]  Telephone:([phone]) [phone] Fax:(336) [phone]     ID: IVER FEHRENBACH OB: [birth year]  MR#: [id number]  VXB#:[id number]  PCP: Criselda Peaches, MD GYN:  Vania Rea SU: Stark Klein  OTHER MD: [name], Silvano Rusk, Arther Abbott  CHIEF COMPLAINT: Left Breast Cancer, locally advanced TREATMENT: under active chemo-immunotherapy   BREAST CANCER HISTORY: Nevah had routine screening mammography at [facility] 09/14/2048. This showed a possible mass in the left breast. On 10/11/2013 she underwent left diagnostic mammography and ultrasonography which confirmed an irregular mass in the upper outer left breast measuring up to 3 cm. This was firm and fixed at the 2:30 o'clock position, 6 cm from the nipple. Ultrasound showed an irregular hypoechoic mass measuring 2.8 cm. The left axilla showed no evidence of adenopathy.  On 10/11/2013 the patient underwent left breast biopsy, with the pathology [facility] [phone]) showing an invasive ductal carcinoma, high-grade, estrogen receptor 100% positive, progesterone receptor 43% positive, both with strong staining intensity, with an MIB-1 of 87% and HER-2 amplified, the signals ratio being 2.72, the number per cell 3.40.  On 10/23/2048 the patient underwent bilateral breast MRI. This showed the breast and position to be density be. In the left breast there was a 3 cm irregular enhancing mass in the upper outer quadrant. There was a 9 mm oval mass in the inner lower left breast with no definite fatty hilum. There was a 1.4 cm upper left axillary lymph node with a thickened cortex. There were no other abnormal appearing lymph nodes. Ultrasound of the left breast April 20 7.15 found of the left axillary lymph node with the mildly asymmetrical cortex, and a 7 mm her mass in the 7:30 o'clock position of the left breast. Biopsy of both these suspicious areas was performed 10/30/2013, and the preliminary report is that they're both  benign.  The patient's subsequent history is as detailed below  INTERVAL HISTORY: Chandelle returns today for followup of her locally advanced left breast carcinoma. Today is day 10 cycle 1 of 6 cycles planned of carboplatin, docetaxel, trastuzumab and pertuzumab, to be followed by trastuzumab alone to complete a year  REVIEW OF SYSTEMS: Amenda did generally well with her first cycle. She was "wiped out" for about 4 days. By day 5 she was feeling better. She did her best to drink a couple port today but Wednesday was the hardest day to get that done, that would be day 3. She had no nausea or vomiting but the Compazine makes her dizzy and caused blurred vision. She stopped taking it after day 3. She did have diarrhea, the worst day being day 5, when she had 3 loose bowel movements. She was barely able to control it with Imodium. She had mild epistaxis and mild hemorrhoidal bleeding from all the bowel movements. She also has significant bony pain from the Neulasta. Aside from these issues a detailed review of systems was noncontributory  PAST MEDICAL HISTORY: Past Medical History  Diagnosis Date  . Abnormal cholesterol test   . Hyperlipidemia   . Knee pain, left   . GERD (gastroesophageal reflux disease)   . Arthritis   . Tubulovillous adenoma of colon   . Diverticulosis   . Internal hemorrhoids   . Wears glasses   . Hearing deficit     no hearing aids    PAST SURGICAL HISTORY: Past Surgical History  Procedure Laterality Date  . Tonsillectomy and adenoidectomy    . Right knee arthroscopy  2011  Dr. Aline Brochure  . Treatment fistula anal    . Upper gastrointestinal endoscopy    . Hemorrhoid surgery    . Colonoscopy  2002    hemorrhoids  . Colonoscopy w/ polypectomy  12/09/2010    1 cm sigmoid polyp - , diverticulosis, hemorrhoids  . Knee surgery    . Knee surgery  2012    Left knee arthroscopy  . Total knee arthroplasty  11/23/2011    Procedure: TOTAL KNEE ARTHROPLASTY;  Surgeon: Carole Civil, MD;  Location: [facility];  Service: Orthopedics;  Laterality: Left;  . Portacath placement Left 11/17/2013    Procedure: INSERTION PORT-A-CATH;  Surgeon: Stark Klein, MD;  Location: [facility];  Service: General;  Laterality: Left;    FAMILY HISTORY Family History  Problem Relation Age of Onset  . Adopted: Yes  . Stroke Mother   . Heart attack Father    the patient is adopted and has no information on her biological family. the patient's adoptive father died at the age of 75 from a myocardial infarction; productive mother died from a stroke at age 24. The patient does not know of any siblings  GYNECOLOGIC HISTORY: (Reviewed 11/14/2013) Menarche age 65. The patient is GX P0. She underwent menopause in her early 24s and took hormone replacement until 2010.  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 She is single, and lives at home with her black lab, [name], and her cats Patches and [name]    ADVANCED DIRECTIVES: Not in place   HEALTH MAINTENANCE:  (Updated 11/14/2013) History  Substance Use Topics  . Smoking status: Never Smoker   . Smokeless tobacco: Never Used  . Alcohol Use: Yes     Comment: socially     Colonoscopy: 2012/[name]  PAP: 2014/[name]  Bone density: not on file  Lipid panel: not on file   Allergies  Allergen Reactions  . Sulfonamide Derivatives Anaphylaxis and Hives    Pt. Stated, "large welps on neck immediately."  . Triprolidine-Pse Other (See Comments)    This is Actifed. Increased heart rate to tachycardia.    Current Outpatient Prescriptions  Medication Sig Dispense Refill  . Acetaminophen (TYLENOL EXTRA STRENGTH PO) Take by mouth.      . dexamethasone (DECADRON) 4 MG tablet 2 tabs by mouth with food twice daily on day before and 3 days after each chemo cycle  60 tablet  1  . hydrocortisone (ANUSOL-HC) 25 MG suppository Place 1 suppository  (25 mg total) rectally at bedtime as needed for hemorrhoids or itching.  10 suppository  0  . lidocaine-prilocaine (EMLA) cream Apply to port 1-2 hrs before each procedure as directed  30 g  6  . loratadine (CLARITIN) 10 MG tablet Take 10 mg by mouth daily as needed. For allergies      . LORazepam (ATIVAN) 0.5 MG tablet Take 1 tablet (0.5 mg total) by mouth at bedtime as needed for anxiety or sleep (or nausea).  30 tablet  0  . omeprazole (PRILOSEC) 20 MG capsule Take 20 mg by mouth as needed.      . ondansetron (ZOFRAN) 8 MG tablet 1 tab by mouth twice daily x 3 days after chemo, then 1 tab every 12 hrs if needed for nausea  30 tablet  1  . oxyCODONE-acetaminophen (ROXICET) 5-325 MG per tablet Take 1-2 tablets by mouth every 4 (four) hours as needed for severe pain.  30 tablet  0  . prochlorperazine (COMPAZINE) 10 MG tablet 1 tab by mouth with meals and at bedtime x 3 days after chemo, then 1 tab by mouth every 6 hrs if needed for nausea  30 tablet  3  . rosuvastatin (CRESTOR) 5 MG tablet Take 5 mg by mouth daily.       No current facility-administered medications for this visit.    OBJECTIVE: Middle-aged white woman who appears stated age 26 Vitals:   11/29/13 1408  BP: 134/83  Pulse: 106  Temp: 98.3 F (36.8 C)  Resp: 18     Body mass index is 32.29 kg/(m^2).    ECOG FS:1 - Symptomatic but completely ambulatory Filed Weights   11/29/13 1408  Weight: 212 lb 4.8 oz (96.299 kg)   Sclerae unicteric, EOMs intact Oropharynx clear and moist No cervical or supraclavicular adenopathy Lungs no rales or rhonchi Heart regular rate and rhythm Abd soft, nontender, positive bowel sounds MSK no focal spinal tenderness, no upper extremity lymphedema Neuro: nonfocal, well oriented, appropriate affect Breasts: The right breast is unremarkable. I do not feel a mass in the left breast. There are no skin or nipple changes of concern. The left axilla is benign.    LAB RESULTS:   Lab Results   Component Value Date   WBC 7.7 11/14/2013   NEUTROABS 4.9 11/14/2013   HGB 12.8 11/14/2013   HCT 39.6 11/14/2013   MCV 82.1 11/14/2013   PLT 267 11/14/2013      Chemistry      Component Value Date/Time   NA 143 11/14/2013 1324   NA 135 11/24/2011 0513   K 4.2 11/14/2013 1324   K 3.9 11/24/2011 0513   CL 102 11/24/2011 0513   CO2 25 11/14/2013 1324   CO2 25 11/24/2011 0513   BUN 14.4 11/14/2013 1324   BUN 11 11/24/2011 0513   CREATININE 0.8 11/14/2013 1324   CREATININE 0.54 11/24/2011 0513      Component Value Date/Time   CALCIUM 10.0 11/14/2013 1324   CALCIUM 9.0 11/24/2011 0513   ALKPHOS 117 11/14/2013 1324   AST 19 11/14/2013 1324   ALT 18 11/14/2013 1324   BILITOT 0.51 11/14/2013 1324       STUDIES:  Echocardiogram on 11/14/2013 shows an EF 55-60%   ASSESSMENT: 63 y.o. [place] woman status post left breast biopsy 10/11/2013 for a clinical T2 N0, stage IIA invasive ductal carcinoma, grade 3, estrogen and progesterone receptor positive, with an MIB-1 of 87%, and HER-2 amplified  (a) additional Left breast and left axillary node biopsy 10/30/2013 benign  (1) started neoadjuvant therapy 11/20/2013 , consisting of carboplatin, docetaxel, trastuzumab and pertuzumab every 3 weeks x6 with Neulasta day 2  (2) trastuzumab alone to continue to complete a year  (3) surgery to follow chemotherapy  (4) radiation to follow surgery  (5) anti-estrogens to follow radiation  PLAN: Kaiyana did well overall with her first cycle, which I always think is the worst 1. Later on there may be more fatigued but the body does get used to the drugs. I expect for example the reaction to Neulasta will be less severe.  She is likely to have diarrhea every time. I am adding Questran and she will take this in addition to Imodium starting with her second diarrheal bowel movements of the day. She can repeated up to 3 times a day.  She is tolerating the prochlorperazine poorly. We are going to continue that for a  day and a half  after chemotherapy but only at half dose. If that is too much we will eliminated entirely.  We discussed using saline nasal spray for her epistaxis. Otherwise she is scheduled for her next treatment on June 8 and the plan will be to continue to see her on treatment day and then a week or so later to make any changes necessary until she completes all 6 cycles of chemotherapy.  Christon has a good understanding of the overall plan. She agrees with it. She knows the goal of treatment in her case is cure. She will call with any problems that may develop before her next visit here.   Chauncey Cruel, MD   11/29/2013 2:22 PM

## 2013-12-11 ENCOUNTER — Other Ambulatory Visit: Payer: Self-pay | Admitting: Oncology

## 2013-12-11 ENCOUNTER — Ambulatory Visit (HOSPITAL_BASED_OUTPATIENT_CLINIC_OR_DEPARTMENT_OTHER): Payer: Medicare Other

## 2013-12-11 ENCOUNTER — Encounter: Payer: Self-pay | Admitting: Physician Assistant

## 2013-12-11 ENCOUNTER — Other Ambulatory Visit (HOSPITAL_BASED_OUTPATIENT_CLINIC_OR_DEPARTMENT_OTHER): Payer: 59

## 2013-12-11 ENCOUNTER — Ambulatory Visit (HOSPITAL_BASED_OUTPATIENT_CLINIC_OR_DEPARTMENT_OTHER): Payer: 59 | Admitting: Physician Assistant

## 2013-12-11 VITALS — BP 140/73 | HR 89 | Temp 98.0°F | Resp 19 | Ht 68.0 in | Wt 220.7 lb

## 2013-12-11 DIAGNOSIS — C50412 Malignant neoplasm of upper-outer quadrant of left female breast: Secondary | ICD-10-CM

## 2013-12-11 DIAGNOSIS — C50419 Malignant neoplasm of upper-outer quadrant of unspecified female breast: Secondary | ICD-10-CM

## 2013-12-11 DIAGNOSIS — Z5111 Encounter for antineoplastic chemotherapy: Secondary | ICD-10-CM

## 2013-12-11 DIAGNOSIS — Z5112 Encounter for antineoplastic immunotherapy: Secondary | ICD-10-CM

## 2013-12-11 DIAGNOSIS — Z170 Estrogen receptor positive status [ER+]: Secondary | ICD-10-CM

## 2013-12-11 LAB — CBC WITH DIFFERENTIAL/PLATELET
BASO%: 0 % (ref 0.0–2.0)
Basophils Absolute: 0 10*3/uL (ref 0.0–0.1)
EOS ABS: 0 10*3/uL (ref 0.0–0.5)
EOS%: 0 % (ref 0.0–7.0)
HCT: 36.6 % (ref 34.8–46.6)
HGB: 11.8 g/dL (ref 11.6–15.9)
LYMPH%: 8.9 % — AB (ref 14.0–49.7)
MCH: 26.2 pg (ref 25.1–34.0)
MCHC: 32.2 g/dL (ref 31.5–36.0)
MCV: 81.3 fL (ref 79.5–101.0)
MONO#: 0.2 10*3/uL (ref 0.1–0.9)
MONO%: 2.3 % (ref 0.0–14.0)
NEUT%: 88.8 % — ABNORMAL HIGH (ref 38.4–76.8)
NEUTROS ABS: 8.7 10*3/uL — AB (ref 1.5–6.5)
PLATELETS: 249 10*3/uL (ref 145–400)
RBC: 4.5 10*6/uL (ref 3.70–5.45)
RDW: 15.3 % — AB (ref 11.2–14.5)
WBC: 9.8 10*3/uL (ref 3.9–10.3)
lymph#: 0.9 10*3/uL (ref 0.9–3.3)

## 2013-12-11 LAB — COMPREHENSIVE METABOLIC PANEL (CC13)
ALK PHOS: 105 U/L (ref 40–150)
ALT: 27 U/L (ref 0–55)
AST: 17 U/L (ref 5–34)
Albumin: 3.7 g/dL (ref 3.5–5.0)
Anion Gap: 13 mEq/L — ABNORMAL HIGH (ref 3–11)
BILIRUBIN TOTAL: 0.41 mg/dL (ref 0.20–1.20)
BUN: 17.1 mg/dL (ref 7.0–26.0)
CO2: 21 mEq/L — ABNORMAL LOW (ref 22–29)
Calcium: 9.6 mg/dL (ref 8.4–10.4)
Chloride: 111 mEq/L — ABNORMAL HIGH (ref 98–109)
Creatinine: 0.8 mg/dL (ref 0.6–1.1)
GLUCOSE: 238 mg/dL — AB (ref 70–140)
Potassium: 3.8 mEq/L (ref 3.5–5.1)
Sodium: 145 mEq/L (ref 136–145)
Total Protein: 7 g/dL (ref 6.4–8.3)

## 2013-12-11 MED ORDER — SODIUM CHLORIDE 0.9 % IV SOLN
Freq: Once | INTRAVENOUS | Status: AC
  Administered 2013-12-11: 12:00:00 via INTRAVENOUS

## 2013-12-11 MED ORDER — DEXAMETHASONE SODIUM PHOSPHATE 20 MG/5ML IJ SOLN
20.0000 mg | Freq: Once | INTRAMUSCULAR | Status: AC
  Administered 2013-12-11: 20 mg via INTRAVENOUS

## 2013-12-11 MED ORDER — DOCETAXEL CHEMO INJECTION 160 MG/16ML
75.0000 mg/m2 | Freq: Once | INTRAVENOUS | Status: AC
  Administered 2013-12-11: 160 mg via INTRAVENOUS
  Filled 2013-12-11: qty 16

## 2013-12-11 MED ORDER — ACETAMINOPHEN 325 MG PO TABS
ORAL_TABLET | ORAL | Status: AC
  Filled 2013-12-11: qty 1

## 2013-12-11 MED ORDER — ACETAMINOPHEN 325 MG PO TABS
ORAL_TABLET | ORAL | Status: AC
  Filled 2013-12-11: qty 2

## 2013-12-11 MED ORDER — DIPHENHYDRAMINE HCL 25 MG PO CAPS
25.0000 mg | ORAL_CAPSULE | Freq: Once | ORAL | Status: AC
  Administered 2013-12-11: 25 mg via ORAL

## 2013-12-11 MED ORDER — ACETAMINOPHEN 325 MG PO TABS
650.0000 mg | ORAL_TABLET | Freq: Once | ORAL | Status: AC
  Administered 2013-12-11: 650 mg via ORAL

## 2013-12-11 MED ORDER — DEXAMETHASONE SODIUM PHOSPHATE 20 MG/5ML IJ SOLN
INTRAMUSCULAR | Status: AC
  Filled 2013-12-11: qty 5

## 2013-12-11 MED ORDER — HEPARIN SOD (PORK) LOCK FLUSH 100 UNIT/ML IV SOLN
500.0000 [IU] | Freq: Once | INTRAVENOUS | Status: AC | PRN
Start: 2013-12-11 — End: 2013-12-11
  Administered 2013-12-11: 500 [IU]
  Filled 2013-12-11: qty 5

## 2013-12-11 MED ORDER — CARBOPLATIN CHEMO INJECTION 600 MG/60ML
681.5000 mg | Freq: Once | INTRAVENOUS | Status: AC
  Administered 2013-12-11: 680 mg via INTRAVENOUS
  Filled 2013-12-11: qty 68

## 2013-12-11 MED ORDER — ONDANSETRON 16 MG/50ML IVPB (CHCC)
INTRAVENOUS | Status: AC
  Filled 2013-12-11: qty 16

## 2013-12-11 MED ORDER — DIPHENHYDRAMINE HCL 25 MG PO CAPS
ORAL_CAPSULE | ORAL | Status: AC
  Filled 2013-12-11: qty 1

## 2013-12-11 MED ORDER — SODIUM CHLORIDE 0.9 % IJ SOLN
10.0000 mL | INTRAMUSCULAR | Status: DC | PRN
  Administered 2013-12-11: 10 mL
  Filled 2013-12-11: qty 10

## 2013-12-11 MED ORDER — SODIUM CHLORIDE 0.9 % IV SOLN
420.0000 mg | Freq: Once | INTRAVENOUS | Status: AC
  Administered 2013-12-11: 420 mg via INTRAVENOUS
  Filled 2013-12-11: qty 14

## 2013-12-11 MED ORDER — ONDANSETRON 16 MG/50ML IVPB (CHCC)
16.0000 mg | Freq: Once | INTRAVENOUS | Status: AC
  Administered 2013-12-11: 16 mg via INTRAVENOUS

## 2013-12-11 MED ORDER — TRASTUZUMAB CHEMO INJECTION 440 MG
6.0000 mg/kg | Freq: Once | INTRAVENOUS | Status: AC
  Administered 2013-12-11: 588 mg via INTRAVENOUS
  Filled 2013-12-11: qty 28

## 2013-12-11 NOTE — Patient Instructions (Signed)
[facility] Walker Discharge Instructions for Patients Receiving Chemotherapy  Today you received the following chemotherapy agents Herceptin, Perjeta, Carboplatin, and Docetaxel. To help prevent nausea and vomiting after your treatment, we encourage you to take your nausea medication as directed.    If you develop nausea and vomiting that is not controlled by your nausea medication, call the clinic.   BELOW ARE SYMPTOMS THAT SHOULD BE REPORTED IMMEDIATELY:  *FEVER GREATER THAN 100.5 F  *CHILLS WITH OR WITHOUT FEVER  NAUSEA AND VOMITING THAT IS NOT CONTROLLED WITH YOUR NAUSEA MEDICATION  *UNUSUAL SHORTNESS OF BREATH  *UNUSUAL BRUISING OR BLEEDING  TENDERNESS IN MOUTH AND THROAT WITH OR WITHOUT PRESENCE OF ULCERS  *URINARY PROBLEMS  *BOWEL PROBLEMS  UNUSUAL RASH Items with * indicate a potential emergency and should be followed up as soon as possible.  Feel free to call the clinic you have any questions or concerns. The clinic phone number is [phone].

## 2013-12-11 NOTE — Progress Notes (Signed)
[name]  [phone]) [phone] Fax:(336) [phone]     ID: April [facility] OB: [birth year]  MR#: [id number]  QTM#:[id number]  PCP: Criselda Peaches, MD GYN:  Vania Rea SU: Stark Klein  OTHER MD: [name], Silvano Rusk, Arther Abbott  CHIEF COMPLAINT: Left Breast Cancer, locally advanced TREATMENT: under active chemo-immunotherapy   BREAST CANCER HISTORY: April [facility] had routine screening mammography at [facility] 09/14/2048. This showed a possible mass in the left breast. On 10/11/2013 she underwent left diagnostic mammography and ultrasonography which confirmed an irregular mass in the upper outer left breast measuring up to 3 cm. This was firm and fixed at the 2:30 o'clock position, 6 cm from the nipple. Ultrasound showed an irregular hypoechoic mass measuring 2.8 cm. The left axilla showed no evidence of adenopathy.  On 10/11/2013 the patient underwent left breast biopsy, with the pathology [facility] [phone]) showing an invasive ductal carcinoma, high-grade, estrogen receptor 100% positive, progesterone receptor 43% positive, both with strong staining intensity, with an MIB-1 of 87% and HER-2 amplified, the signals ratio being 2.72, the number per cell 3.40.  On 10/23/2048 the patient underwent bilateral breast MRI. This showed the breast and position to be density be. In the left breast there was a 3 cm irregular enhancing mass in the upper outer quadrant. There was a 9 mm oval mass in the inner lower left breast with no definite fatty hilum. There was a 1.4 cm upper left axillary lymph node with a thickened cortex. There were no other abnormal appearing lymph nodes. Ultrasound of the left breast April 20 7.15 found of the left axillary lymph node with the mildly asymmetrical cortex, and a 7 mm her mass in the 7:30 o'clock position of the left breast. Biopsy of both these suspicious areas was performed 10/30/2013, and the preliminary report is that they're both  benign.  The patient's subsequent history is as detailed below  INTERVAL HISTORY: April [facility] returns today for followup of her locally advanced left breast carcinoma. Today is day 1 cycle 2 of 6 cycles planned of carboplatin, docetaxel, trastuzumab and pertuzumab, to be followed by trastuzumab alone to complete a year  REVIEW OF SYSTEMS: April [facility] did generally well with her first cycle. She was "wiped out" for about 4 days. By day 5 she was feeling better. It today she voices no specific complaints. She did have diarrhea after her first cycle of chemotherapy. Questran powder has been added to the Imodium for control. She reports no further episodes of epistaxis or hemorrhoidal irritation. She also has significant bony pain from the Neulasta. Aside from these issues a detailed review of systems was noncontributory  PAST MEDICAL HISTORY: Past Medical History  Diagnosis Date  . Abnormal cholesterol test   . Hyperlipidemia   . Knee pain, left   . GERD (gastroesophageal reflux disease)   . Arthritis   . Tubulovillous adenoma of colon   . Diverticulosis   . Internal hemorrhoids   . Wears glasses   . Hearing deficit     no hearing aids    PAST SURGICAL HISTORY: Past Surgical History  Procedure Laterality Date  . Tonsillectomy and adenoidectomy    . Right knee arthroscopy  2011 Dr. Aline Brochure  . Treatment fistula anal    . Upper gastrointestinal endoscopy    . Hemorrhoid surgery    . Colonoscopy  2002    hemorrhoids  . Colonoscopy w/ polypectomy  12/09/2010    1 cm sigmoid polyp - , diverticulosis, hemorrhoids  .  Knee surgery    . Knee surgery  2012    Left knee arthroscopy  . Total knee arthroplasty  11/23/2011    Procedure: TOTAL KNEE ARTHROPLASTY;  Surgeon: Carole Civil, MD;  Location: [facility];  Service: Orthopedics;  Laterality: Left;  . Portacath placement Left 11/17/2013    Procedure: INSERTION PORT-A-CATH;  Surgeon: Stark Klein, MD;  Location: [place];  Service:  General;  Laterality: Left;    FAMILY HISTORY Family History  Problem Relation Age of Onset  . Adopted: Yes  . Stroke Mother   . Heart attack Father    the patient is adopted and has no information on her biological family. the patient's adoptive father died at the age of 52 from a myocardial infarction; adoptive mother died from a stroke at age 42. The patient does not know of any siblings  GYNECOLOGIC HISTORY: (Reviewed 11/14/2013) Menarche age 63. The patient is GX P0. She underwent menopause in her early 5s and took hormone replacement until 2010.  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 She is single, and lives at home with her black lab, [name], and her cats Patches and [name]    ADVANCED DIRECTIVES: Not in place   HEALTH MAINTENANCE:  (Updated 11/14/2013) History  Substance Use Topics  . Smoking status: Never Smoker   . Smokeless tobacco: Never Used  . Alcohol Use: Yes     Comment: socially     Colonoscopy: 2012/[name]  PAP: 2014/[name]  Bone density: not on file  Lipid panel: not on file   Allergies  Allergen Reactions  . Sulfonamide Derivatives Anaphylaxis and Hives    Pt. Stated, "large welps on neck immediately."  . Triprolidine-Pse Other (See Comments)    This is Actifed. Increased heart rate to tachycardia.    Current Outpatient Prescriptions  Medication Sig Dispense Refill  . Acetaminophen (TYLENOL EXTRA STRENGTH PO) Take by mouth.      . cholestyramine (QUESTRAN) 4 GM/DOSE powder Take 0.5 packets (2 g total) by mouth 2 (two) times daily as needed.  378 g  12  . dexamethasone (DECADRON) 4 MG tablet 2 tabs by mouth with food twice daily on day before and 3 days after each chemo cycle  60 tablet  1  . hydrocortisone (ANUSOL-HC) 25 MG suppository Place 1 suppository (25 mg total) rectally at bedtime as needed for hemorrhoids or itching.  10 suppository  0  .  lidocaine-prilocaine (EMLA) cream Apply to port 1-2 hrs before each procedure as directed  30 g  6  . loratadine (CLARITIN) 10 MG tablet Take 10 mg by mouth daily as needed. For allergies      . LORazepam (ATIVAN) 0.5 MG tablet Take 1 tablet (0.5 mg total) by mouth at bedtime as needed for anxiety or sleep (or nausea).  30 tablet  0  . omeprazole (PRILOSEC) 20 MG capsule Take 20 mg by mouth as needed.      . ondansetron (ZOFRAN) 8 MG tablet 1 tab by mouth twice daily x 3 days after chemo, then 1 tab every 12 hrs if needed for nausea  30 tablet  1  . prochlorperazine (COMPAZINE) 10 MG tablet 1 tab by mouth with meals and at bedtime x 3 days after chemo, then 1 tab by mouth every 6 hrs if needed for nausea  30 tablet  3  . rosuvastatin (CRESTOR) 5 MG tablet Take 5  mg by mouth daily.      Marland Kitchen oxyCODONE-acetaminophen (ROXICET) 5-325 MG per tablet Take 1-2 tablets by mouth every 4 (four) hours as needed for severe pain.  30 tablet  0   No current facility-administered medications for this visit.    OBJECTIVE: Middle-aged white woman who appears stated age 63 Vitals:   12/11/13 1004  BP: 140/73  Pulse: 89  Temp: 98 F (36.7 C)  Resp: 19     Body mass index is 33.57 kg/(m^2).    ECOG FS:1 - Symptomatic but completely ambulatory Filed Weights   12/11/13 1004  Weight: 220 lb 11.2 oz (100.109 kg)   Sclerae unicteric, EOMs intact Oropharynx clear and moist No cervical or supraclavicular adenopathy Lungs no rales or rhonchi Heart regular rate and rhythm Abd soft, nontender, positive bowel sounds MSK no focal spinal tenderness, no upper extremity lymphedema Neuro: nonfocal, well oriented, appropriate affect Breasts: The right breast is unremarkable. No appreciable masses in the left breast. There are no skin or nipple changes of concern. The left axilla is benign.    LAB RESULTS:   Lab Results  Component Value Date   WBC 9.8 12/11/2013   NEUTROABS 8.7* 12/11/2013   HGB 11.8 12/11/2013    HCT 36.6 12/11/2013   MCV 81.3 12/11/2013   PLT 249 12/11/2013      Chemistry      Component Value Date/Time   NA 145 12/11/2013 0931   NA 135 11/24/2011 0513   K 3.8 12/11/2013 0931   K 3.9 11/24/2011 0513   CL 102 11/24/2011 0513   CO2 21* 12/11/2013 0931   CO2 25 11/24/2011 0513   BUN 17.1 12/11/2013 0931   BUN 11 11/24/2011 0513   CREATININE 0.8 12/11/2013 0931   CREATININE 0.54 11/24/2011 0513      Component Value Date/Time   CALCIUM 9.6 12/11/2013 0931   CALCIUM 9.0 11/24/2011 0513   ALKPHOS 105 12/11/2013 0931   AST 17 12/11/2013 0931   ALT 27 12/11/2013 0931   BILITOT 0.41 12/11/2013 0931       STUDIES:  Echocardiogram on 11/14/2013 shows an EF 55-60%   ASSESSMENT: 63 y.o. April [facility] woman status post left breast biopsy 10/11/2013 for a clinical T2 N0, stage IIA invasive ductal carcinoma, grade 3, estrogen and progesterone receptor positive, with an MIB-1 of 87%, and HER-2 amplified  (a) additional Left breast and left axillary node biopsy 10/30/2013 benign  (1) started neoadjuvant therapy 11/20/2013 , consisting of carboplatin, docetaxel, trastuzumab and pertuzumab every 3 weeks x6 with Neulasta day 2  (2) trastuzumab alone to continue to complete a year  (3) surgery to follow chemotherapy  (4) radiation to follow surgery  (5) anti-estrogens to follow radiation  PLAN: April [facility] did well overall with her first cycle. She will utilize the Questran powder and Imodium as instructed. Her labs have been reviewed. She'll proceed today with day 1 of cycle #2 of 6 planned cycles of TCH plus pertuzumab today as scheduled no dose modifications.   She is tolerating the prochlorperazine poorly. Per Dr. Jana Hakim, we are going to continue that for a day and a half after chemotherapy but only at half dose. If that is too much we will eliminated entirely.  She'll followup one week for another symptom management visit as previously scheduled  April [facility] has a good understanding of the overall plan. She agrees  with it. She knows the goal of treatment in her case is cure. She will call with any problems that may  develop before her next visit here.   Carlton Adam, PA-C   12/11/2013 10:56 AM

## 2013-12-12 ENCOUNTER — Ambulatory Visit (HOSPITAL_BASED_OUTPATIENT_CLINIC_OR_DEPARTMENT_OTHER): Payer: Medicare Other

## 2013-12-12 VITALS — BP 141/70 | HR 86 | Temp 98.3°F

## 2013-12-12 DIAGNOSIS — C50412 Malignant neoplasm of upper-outer quadrant of left female breast: Secondary | ICD-10-CM

## 2013-12-12 DIAGNOSIS — Z5189 Encounter for other specified aftercare: Secondary | ICD-10-CM

## 2013-12-12 DIAGNOSIS — C50419 Malignant neoplasm of upper-outer quadrant of unspecified female breast: Secondary | ICD-10-CM

## 2013-12-12 MED ORDER — PEGFILGRASTIM INJECTION 6 MG/0.6ML
6.0000 mg | Freq: Once | SUBCUTANEOUS | Status: AC
  Administered 2013-12-12: 6 mg via SUBCUTANEOUS
  Filled 2013-12-12: qty 0.6

## 2013-12-13 NOTE — Patient Instructions (Signed)
Continue labs and chemotherapy as scheduled Follow-up in one week 

## 2013-12-18 ENCOUNTER — Ambulatory Visit (HOSPITAL_BASED_OUTPATIENT_CLINIC_OR_DEPARTMENT_OTHER): Payer: Medicare Other | Admitting: Physician Assistant

## 2013-12-18 ENCOUNTER — Other Ambulatory Visit (HOSPITAL_BASED_OUTPATIENT_CLINIC_OR_DEPARTMENT_OTHER): Payer: Medicare Other

## 2013-12-18 ENCOUNTER — Telehealth: Payer: Self-pay | Admitting: Physician Assistant

## 2013-12-18 ENCOUNTER — Encounter: Payer: Self-pay | Admitting: Physician Assistant

## 2013-12-18 VITALS — BP 133/79 | HR 102 | Temp 98.7°F | Resp 18 | Ht 68.0 in | Wt 216.9 lb

## 2013-12-18 DIAGNOSIS — C50419 Malignant neoplasm of upper-outer quadrant of unspecified female breast: Secondary | ICD-10-CM

## 2013-12-18 DIAGNOSIS — C50412 Malignant neoplasm of upper-outer quadrant of left female breast: Secondary | ICD-10-CM

## 2013-12-18 DIAGNOSIS — B009 Herpesviral infection, unspecified: Secondary | ICD-10-CM

## 2013-12-18 DIAGNOSIS — B001 Herpesviral vesicular dermatitis: Secondary | ICD-10-CM | POA: Insufficient documentation

## 2013-12-18 DIAGNOSIS — R040 Epistaxis: Secondary | ICD-10-CM

## 2013-12-18 DIAGNOSIS — Z170 Estrogen receptor positive status [ER+]: Secondary | ICD-10-CM

## 2013-12-18 DIAGNOSIS — R197 Diarrhea, unspecified: Secondary | ICD-10-CM

## 2013-12-18 LAB — CBC WITH DIFFERENTIAL/PLATELET
BASO%: 0.5 % (ref 0.0–2.0)
BASOS ABS: 0.1 10*3/uL (ref 0.0–0.1)
EOS ABS: 0 10*3/uL (ref 0.0–0.5)
EOS%: 0 % (ref 0.0–7.0)
HEMATOCRIT: 37.6 % (ref 34.8–46.6)
HEMOGLOBIN: 12.3 g/dL (ref 11.6–15.9)
LYMPH%: 19 % (ref 14.0–49.7)
MCH: 26.7 pg (ref 25.1–34.0)
MCHC: 32.6 g/dL (ref 31.5–36.0)
MCV: 81.7 fL (ref 79.5–101.0)
MONO#: 1.2 10*3/uL — AB (ref 0.1–0.9)
MONO%: 10.4 % (ref 0.0–14.0)
NEUT#: 8.4 10*3/uL — ABNORMAL HIGH (ref 1.5–6.5)
NEUT%: 70.1 % (ref 38.4–76.8)
PLATELETS: 229 10*3/uL (ref 145–400)
RBC: 4.6 10*6/uL (ref 3.70–5.45)
RDW: 15.5 % — ABNORMAL HIGH (ref 11.2–14.5)
WBC: 12 10*3/uL — AB (ref 3.9–10.3)
lymph#: 2.3 10*3/uL (ref 0.9–3.3)

## 2013-12-18 LAB — COMPREHENSIVE METABOLIC PANEL (CC13)
ALT: 25 U/L (ref 0–55)
AST: 16 U/L (ref 5–34)
Albumin: 3.7 g/dL (ref 3.5–5.0)
Alkaline Phosphatase: 118 U/L (ref 40–150)
Anion Gap: 9 mEq/L (ref 3–11)
BILIRUBIN TOTAL: 0.49 mg/dL (ref 0.20–1.20)
BUN: 12.6 mg/dL (ref 7.0–26.0)
CO2: 28 mEq/L (ref 22–29)
Calcium: 9.1 mg/dL (ref 8.4–10.4)
Chloride: 104 mEq/L (ref 98–109)
Creatinine: 0.8 mg/dL (ref 0.6–1.1)
GLUCOSE: 134 mg/dL (ref 70–140)
Potassium: 3.5 mEq/L (ref 3.5–5.1)
Sodium: 141 mEq/L (ref 136–145)
TOTAL PROTEIN: 6.6 g/dL (ref 6.4–8.3)

## 2013-12-18 MED ORDER — ACYCLOVIR 400 MG PO TABS: 400.0000 mg | ORAL_TABLET | Freq: Two times a day (BID) | ORAL | Status: DC

## 2013-12-18 NOTE — Progress Notes (Signed)
[place]  Telephone:(336) [phone] Fax:(336) [phone]     ID: April Walker OB: [birth year]  MR#: [id number]  SFS#:[id number]  PCP: Criselda Peaches, MD GYN:  Vania Rea SU: Stark Klein  OTHER MD: [name], Silvano Rusk, Arther Abbott  CHIEF COMPLAINT: Left Breast Cancer, locally advanced TREATMENT: under active neoadjuvant chemo-immunotherap   BREAST CANCER HISTORY: April [facility] had routine screening mammography at [facility] 09/14/2013. This showed a possible mass in the left breast. On 10/11/2013 she underwent left diagnostic mammography and ultrasonography which confirmed an irregular mass in the upper outer left breast measuring up to 3 cm. This was firm and fixed at the 2:30 o'clock position, 6 cm from the nipple. Ultrasound showed an irregular hypoechoic mass measuring 2.8 cm. The left axilla showed no evidence of adenopathy.  On 10/11/2013 the patient underwent left breast biopsy, with the pathology [facility] [id number]) showing an invasive ductal carcinoma, high-grade, estrogen receptor 100% positive, progesterone receptor 43% positive, both with strong staining intensity, with an MIB-1 of 87% and HER-2 amplified, the signals ratio being 2.72, the number per cell 3.40.  On 10/23/2013 the patient underwent bilateral breast MRI. This showed the breast and position to be density be. In the left breast there was a 3 cm irregular enhancing mass in the upper outer quadrant. There was a 9 mm oval mass in the inner lower left breast with no definite fatty hilum. There was a 1.4 cm upper left axillary lymph node with a thickened cortex. There were no other abnormal appearing lymph nodes. Ultrasound of the left breast 10/30/2013 found of the left axillary lymph node with the mildly asymmetrical cortex, and a 7 mm her mass in the 7:30 o'clock position of the left breast. Biopsy of both these suspicious areas was performed 10/30/2013, and the preliminary report is that they're  both benign.  The patient's subsequent history is as detailed below  INTERVAL HISTORY: Lajune returns alone today for followup of her locally advanced left breast carcinoma. She continues to receive neoadjuvant therapy, with today being day 8 cycle 2 of 6 planned q. three-week doses of docetaxel/carboplatin given with trastuzumab and pertuzumab.  She receives Neulasta on day 2 for granulocyte support.  Overall, April [facility] feels like she is tolerating treatment well. She's had no signs of increased peripheral neuropathy. She's eating and drinking fairly well, with no significant nausea or emesis. She has mild diarrhea for which she utilizes Imodium and Questran as needed, and this takes care of the problem. She has had a little bit of a runny nose this past week, with some mild nosebleeds. These resolved quickly, and she's had no additional bleeding elsewhere. Her only other complaint today is the fact that the Benadryl given with premeds makes her extremely sleepy, and she wonders if we could decrease the dose with the next cycle.  REVIEW OF SYSTEMS: April [facility] has had no fevers, chills, or night sweats. She denies any skin changes or rashes. She denies any change in urinary habits, and is trying to keep herself well hydrated. She denies any cough, phlegm production, shortness of breath, orthopnea, peripheral swelling, chest pain, or palpitations. She's had no abnormal headaches, dizziness, or change in vision. She did have some mild bony pain following the Neulasta injection, but treated this effectively with Claritin and Tylenol. Currently, she denies any unusual myalgias, arthralgias, or bony pain.  A detailed review of systems is otherwise stable and noncontributory.     PAST MEDICAL HISTORY: Past Medical History  Diagnosis Date  . Abnormal cholesterol test   . Hyperlipidemia   . Knee pain, left   . GERD (gastroesophageal reflux disease)   . Arthritis   . Tubulovillous adenoma of colon   .  Diverticulosis   . Internal hemorrhoids   . Wears glasses   . Hearing deficit     no hearing aids    PAST SURGICAL HISTORY: Past Surgical History  Procedure Laterality Date  . Tonsillectomy and adenoidectomy    . Right knee arthroscopy  2011 Dr. Aline Brochure  . Treatment fistula anal    . Upper gastrointestinal endoscopy    . Hemorrhoid surgery    . Colonoscopy  2002    hemorrhoids  . Colonoscopy w/ polypectomy  12/09/2010    1 cm sigmoid polyp - , diverticulosis, hemorrhoids  . Knee surgery    . Knee surgery  2012    Left knee arthroscopy  . Total knee arthroplasty  11/23/2011    Procedure: TOTAL KNEE ARTHROPLASTY;  Surgeon: Carole Civil, MD;  Location: [facility];  Service: Orthopedics;  Laterality: Left;  . Portacath placement Left 11/17/2013    Procedure: INSERTION PORT-A-CATH;  Surgeon: Stark Klein, MD;  Location: [place];  Service: General;  Laterality: Left;    FAMILY HISTORY Family History  Problem Relation Age of Onset  . Adopted: Yes  . Stroke Mother   . Heart attack Father    the patient is adopted and has no information on her biological family. the patient's adoptive father died at the age of 6 from a myocardial infarction; productive mother died from a stroke at age 16. The patient does not know of any siblings  GYNECOLOGIC HISTORY: (Reviewed 12/18/2013) Menarche age 63. The patient is GX P0. She underwent menopause in her early 36s and took hormone replacement until 2010.  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 She is single, and lives at home with her black lab, [name], and her cats Patches and [name]    ADVANCED DIRECTIVES: Not in place   HEALTH MAINTENANCE:  (Reviewed 12/18/2013) History  Substance Use Topics  . Smoking status: Never Smoker   . Smokeless tobacco: Never Used  . Alcohol Use: Yes     Comment: socially     Colonoscopy:  2012/[name]  PAP: 2014/[name]  Bone density: not on file  Lipid panel: not on file   Allergies  Allergen Reactions  . Sulfonamide Derivatives Anaphylaxis and Hives    Pt. Stated, "large welps on neck immediately."  . Triprolidine-Pse Other (See Comments)    This is Actifed. Increased heart rate to tachycardia.    Current Outpatient Prescriptions  Medication Sig Dispense Refill  . Acetaminophen (TYLENOL EXTRA STRENGTH PO) Take by mouth.      . cholestyramine (QUESTRAN) 4 GM/DOSE powder Take 0.5 packets (2 g total) by mouth 2 (two) times daily as needed.  378 g  12  . lidocaine-prilocaine (EMLA) cream Apply to port 1-2 hrs before each procedure as directed  30 g  6  . simvastatin (ZOCOR) 10 MG tablet Take 10 mg by mouth daily. Pt TAKES AT BEDTIME      . acyclovir (ZOVIRAX) 400 MG tablet Take 1 tablet (400 mg total) by mouth 2 (two) times daily.  60 tablet  2  . dexamethasone (DECADRON) 4 MG tablet 2 tabs by mouth with food twice daily on day before and 3 days after  each chemo cycle  60 tablet  1  . hydrocortisone (ANUSOL-HC) 25 MG suppository Place 1 suppository (25 mg total) rectally at bedtime as needed for hemorrhoids or itching.  10 suppository  0  . loratadine (CLARITIN) 10 MG tablet Take 10 mg by mouth daily as needed. For allergies      . LORazepam (ATIVAN) 0.5 MG tablet Take 1 tablet (0.5 mg total) by mouth at bedtime as needed for anxiety or sleep (or nausea).  30 tablet  0  . omeprazole (PRILOSEC) 20 MG capsule Take 20 mg by mouth as needed.      . ondansetron (ZOFRAN) 8 MG tablet 1 tab by mouth twice daily x 3 days after chemo, then 1 tab every 12 hrs if needed for nausea  30 tablet  1  . oxyCODONE-acetaminophen (ROXICET) 5-325 MG per tablet Take 1-2 tablets by mouth every 4 (four) hours as needed for severe pain.  30 tablet  0  . prochlorperazine (COMPAZINE) 10 MG tablet 1 tab by mouth with meals and at bedtime x 3 days after chemo, then 1 tab by mouth every 6 hrs if needed for  nausea  30 tablet  3   No current facility-administered medications for this visit.    OBJECTIVE: Middle-aged white woman who appears comfortable and is in no acute distress Filed Vitals:   12/18/13 1458  BP: 133/79  Pulse: 102  Temp: 98.7 F (37.1 C)  Resp: 18     Body mass index is 32.99 kg/(m^2).    ECOG FS:1 - Symptomatic but completely ambulatory Filed Weights   12/18/13 1458  Weight: 216 lb 14.4 oz (98.385 kg)   Physical Exam: HEENT:  Sclerae anicteric.  Oropharynx pink and moist. There is evidence of a drying vesicle on the lower outer right lip. No ulcerations or mucositis inside the mouth. No evidence of candidiasis. Neck is supple, trachea midline.  NODES:  No cervical or supraclavicular lymphadenopathy palpated.  BREAST EXAM: Breast exam was deferred. Axillae are benign bilaterally, no palpable lymphadenopathy noted. LUNGS:  Clear to auscultation bilaterally.  No wheezes or rhonchi HEART:  Regular rate and rhythm. No murmur  ABDOMEN:  Soft, obese, nontender. No organomegaly or masses palpated. Positive bowel sounds.  MSK:  No focal spinal tenderness to palpation. Good range of motion bilaterally in the upper extremities. EXTREMITIES:  No peripheral edema.  No lymphedema in the left upper extremity. SKIN:  No visible rashes. No nail dyscrasia. No excessive ecchymoses. No petechiae. No pallor. NEURO:  Nonfocal. Well oriented.  Appropriate affect.     LAB RESULTS:   Lab Results  Component Value Date   WBC 12.0* 12/18/2013   NEUTROABS 8.4* 12/18/2013   HGB 12.3 12/18/2013   HCT 37.6 12/18/2013   MCV 81.7 12/18/2013   PLT 229 12/18/2013      Chemistry      Component Value Date/Time   NA 141 12/18/2013 1429   NA 135 11/24/2011 0513   K 3.5 12/18/2013 1429   K 3.9 11/24/2011 0513   CL 102 11/24/2011 0513   CO2 28 12/18/2013 1429   CO2 25 11/24/2011 0513   BUN 12.6 12/18/2013 1429   BUN 11 11/24/2011 0513   CREATININE 0.8 12/18/2013 1429   CREATININE 0.54 11/24/2011 0513       Component Value Date/Time   CALCIUM 9.1 12/18/2013 1429   CALCIUM 9.0 11/24/2011 0513   ALKPHOS 118 12/18/2013 1429   AST 16 12/18/2013 1429   ALT 25 12/18/2013 1429  BILITOT 0.49 12/18/2013 1429       STUDIES:  Echocardiogram on 11/14/2013 shows an EF 55-60%   ASSESSMENT: 63 y.o. [place] woman status post left breast biopsy 10/11/2013 for a clinical T2 N0, stage IIA invasive ductal carcinoma, grade 3, estrogen and progesterone receptor positive, with an MIB-1 of 87%, and HER-2 amplified  (a) additional Left breast and left axillary node biopsy 10/30/2013 benign  (1) started neoadjuvant therapy 11/20/2013 , consisting of carboplatin, docetaxel, trastuzumab and pertuzumab every 3 weeks x6 with Neulasta day 2  (2) trastuzumab alone to continue to complete a year  (3) surgery to follow chemotherapy  (4) radiation to follow surgery  (5) anti-estrogens to follow radiation  PLAN: Satsuki does seem to be tolerating the neoadjuvant treatment reasonably well. 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   Otherwise, we are making no changes to her treatment plan. She will continue using saline nasal spray as needed for mild epistasis, and will continue with her current regimen for diarrhea, including the Imodium and Questran.  Maiana will return in 2 weeks on June 29 for repeat labs and physical exam in anticipation of her third cycle of neoadjuvant chemotherapy. We will repeat her breast MRI soon thereafter to assess response to chemotherapy, and she is already scheduled for a visit on July 7 for assessment of chemotoxicity, at which time we will also review the results of the MRI. She will need to see Dr. Barry Dienes in early to mid July, and will be due for her next echocardiogram in August.   The above plan was reviewed in detail with the patient today, and she voiced her  understanding and agreement with this plan. She does understand that the goal of treatment is cure. She will call if she has any changes or problems prior to her next scheduled appointment.   Aurianna Earlywine, PA-C   12/18/2013 4:22 PM

## 2013-12-18 NOTE — Telephone Encounter (Signed)
per pof to sch pt appt-sch & gave pt copy of sch °

## 2013-12-20 ENCOUNTER — Other Ambulatory Visit: Payer: Self-pay | Admitting: Oncology

## 2013-12-26 ENCOUNTER — Other Ambulatory Visit: Payer: Self-pay | Admitting: Physician Assistant

## 2013-12-27 ENCOUNTER — Other Ambulatory Visit: Payer: Self-pay | Admitting: Physician Assistant

## 2014-01-01 ENCOUNTER — Ambulatory Visit (HOSPITAL_BASED_OUTPATIENT_CLINIC_OR_DEPARTMENT_OTHER): Payer: Medicare Other | Admitting: Adult Health

## 2014-01-01 ENCOUNTER — Ambulatory Visit (HOSPITAL_BASED_OUTPATIENT_CLINIC_OR_DEPARTMENT_OTHER): Payer: Medicare Other

## 2014-01-01 ENCOUNTER — Encounter: Payer: Self-pay | Admitting: Adult Health

## 2014-01-01 ENCOUNTER — Encounter: Payer: Self-pay | Admitting: *Deleted

## 2014-01-01 VITALS — BP 134/80 | HR 83 | Temp 98.3°F | Resp 18 | Ht 68.0 in | Wt 219.8 lb

## 2014-01-01 DIAGNOSIS — C50419 Malignant neoplasm of upper-outer quadrant of unspecified female breast: Secondary | ICD-10-CM

## 2014-01-01 DIAGNOSIS — C50412 Malignant neoplasm of upper-outer quadrant of left female breast: Secondary | ICD-10-CM

## 2014-01-01 DIAGNOSIS — Z170 Estrogen receptor positive status [ER+]: Secondary | ICD-10-CM

## 2014-01-01 DIAGNOSIS — Z5111 Encounter for antineoplastic chemotherapy: Secondary | ICD-10-CM

## 2014-01-01 DIAGNOSIS — Z5112 Encounter for antineoplastic immunotherapy: Secondary | ICD-10-CM

## 2014-01-01 LAB — CBC WITH DIFFERENTIAL/PLATELET
BASO%: 0.4 % (ref 0.0–2.0)
Basophils Absolute: 0 10*3/uL (ref 0.0–0.1)
EOS%: 0 % (ref 0.0–7.0)
Eosinophils Absolute: 0 10*3/uL (ref 0.0–0.5)
HCT: 35.7 % (ref 34.8–46.6)
HEMOGLOBIN: 11.7 g/dL (ref 11.6–15.9)
LYMPH#: 0.9 10*3/uL (ref 0.9–3.3)
LYMPH%: 15.4 % (ref 14.0–49.7)
MCH: 27.2 pg (ref 25.1–34.0)
MCHC: 32.8 g/dL (ref 31.5–36.0)
MCV: 82.7 fL (ref 79.5–101.0)
MONO#: 0.1 10*3/uL (ref 0.1–0.9)
MONO%: 1.7 % (ref 0.0–14.0)
NEUT%: 82.5 % — ABNORMAL HIGH (ref 38.4–76.8)
NEUTROS ABS: 4.7 10*3/uL (ref 1.5–6.5)
Platelets: 213 10*3/uL (ref 145–400)
RBC: 4.31 10*6/uL (ref 3.70–5.45)
RDW: 17.6 % — ABNORMAL HIGH (ref 11.2–14.5)
WBC: 5.7 10*3/uL (ref 3.9–10.3)

## 2014-01-01 LAB — COMPREHENSIVE METABOLIC PANEL (CC13)
ALBUMIN: 4 g/dL (ref 3.5–5.0)
ALK PHOS: 105 U/L (ref 40–150)
ALT: 21 U/L (ref 0–55)
AST: 17 U/L (ref 5–34)
Anion Gap: 9 mEq/L (ref 3–11)
BUN: 14.2 mg/dL (ref 7.0–26.0)
CALCIUM: 9.7 mg/dL (ref 8.4–10.4)
CHLORIDE: 110 meq/L — AB (ref 98–109)
CO2: 23 mEq/L (ref 22–29)
Creatinine: 0.8 mg/dL (ref 0.6–1.1)
GLUCOSE: 174 mg/dL — AB (ref 70–140)
POTASSIUM: 4.1 meq/L (ref 3.5–5.1)
SODIUM: 142 meq/L (ref 136–145)
Total Bilirubin: 0.58 mg/dL (ref 0.20–1.20)
Total Protein: 7 g/dL (ref 6.4–8.3)

## 2014-01-01 MED ORDER — SODIUM CHLORIDE 0.9 % IV SOLN
420.0000 mg | Freq: Once | INTRAVENOUS | Status: AC
  Administered 2014-01-01: 420 mg via INTRAVENOUS
  Filled 2014-01-01: qty 14

## 2014-01-01 MED ORDER — ACETAMINOPHEN 325 MG PO TABS
ORAL_TABLET | ORAL | Status: AC
  Filled 2014-01-01: qty 2

## 2014-01-01 MED ORDER — DIPHENHYDRAMINE HCL 25 MG PO CAPS: 25.0000 mg | ORAL_CAPSULE | Freq: Once | ORAL | Status: DC

## 2014-01-01 MED ORDER — SODIUM CHLORIDE 0.9 % IJ SOLN
10.0000 mL | INTRAMUSCULAR | Status: DC | PRN
  Administered 2014-01-01: 10 mL
  Filled 2014-01-01: qty 10

## 2014-01-01 MED ORDER — DEXAMETHASONE SODIUM PHOSPHATE 20 MG/5ML IJ SOLN
INTRAMUSCULAR | Status: AC
  Filled 2014-01-01: qty 5

## 2014-01-01 MED ORDER — SODIUM CHLORIDE 0.9 % IV SOLN
Freq: Once | INTRAVENOUS | Status: AC
  Administered 2014-01-01: 13:00:00 via INTRAVENOUS

## 2014-01-01 MED ORDER — DIPHENHYDRAMINE HCL 50 MG/ML IJ SOLN
12.5000 mg | Freq: Once | INTRAMUSCULAR | Status: AC
  Administered 2014-01-01: 12.5 mg via INTRAVENOUS

## 2014-01-01 MED ORDER — ONDANSETRON 16 MG/50ML IVPB (CHCC)
16.0000 mg | Freq: Once | INTRAVENOUS | Status: AC
  Administered 2014-01-01: 16 mg via INTRAVENOUS

## 2014-01-01 MED ORDER — DEXAMETHASONE SODIUM PHOSPHATE 20 MG/5ML IJ SOLN
20.0000 mg | Freq: Once | INTRAMUSCULAR | Status: AC
  Administered 2014-01-01: 20 mg via INTRAVENOUS

## 2014-01-01 MED ORDER — HEPARIN SOD (PORK) LOCK FLUSH 100 UNIT/ML IV SOLN
500.0000 [IU] | Freq: Once | INTRAVENOUS | Status: AC | PRN
  Administered 2014-01-01: 500 [IU]
  Filled 2014-01-01: qty 5

## 2014-01-01 MED ORDER — SODIUM CHLORIDE 0.9 % IV SOLN
564.0000 mg | Freq: Once | INTRAVENOUS | Status: AC
  Administered 2014-01-01: 560 mg via INTRAVENOUS
  Filled 2014-01-01: qty 56

## 2014-01-01 MED ORDER — TRASTUZUMAB CHEMO INJECTION 440 MG
6.0000 mg/kg | Freq: Once | INTRAVENOUS | Status: AC
  Administered 2014-01-01: 588 mg via INTRAVENOUS
  Filled 2014-01-01: qty 28

## 2014-01-01 MED ORDER — DOCETAXEL CHEMO INJECTION 160 MG/16ML
75.0000 mg/m2 | Freq: Once | INTRAVENOUS | Status: AC
  Administered 2014-01-01: 160 mg via INTRAVENOUS
  Filled 2014-01-01: qty 16

## 2014-01-01 MED ORDER — DIPHENHYDRAMINE HCL 50 MG/ML IJ SOLN
INTRAMUSCULAR | Status: AC
  Filled 2014-01-01: qty 1

## 2014-01-01 MED ORDER — ONDANSETRON 16 MG/50ML IVPB (CHCC)
INTRAVENOUS | Status: AC
  Filled 2014-01-01: qty 16

## 2014-01-01 MED ORDER — ACETAMINOPHEN 325 MG PO TABS
650.0000 mg | ORAL_TABLET | Freq: Once | ORAL | Status: AC
Start: 2014-01-01 — End: 2014-01-01
  Administered 2014-01-01: 650 mg via ORAL

## 2014-01-01 NOTE — Progress Notes (Signed)
[place]  Telephone:(336) [phone] Fax:(336) [phone]     ID: April Walker OB: August 29, 1950  MR#: [id number]  XMI#:[id number]  PCP: Criselda Peaches, MD GYN:  Vania Rea SU: Stark Klein  OTHER MD: [name], Silvano Rusk, Arther Abbott  CHIEF COMPLAINT: Left Breast Cancer, locally advanced TREATMENT: under active neoadjuvant chemo-immunotherap   BREAST CANCER HISTORY: April Walker had routine screening mammography at [facility] 09/14/2013. This showed a possible mass in the left breast. On 10/11/2013 she underwent left diagnostic mammography and ultrasonography which confirmed an irregular mass in the upper outer left breast measuring up to 3 cm. This was firm and fixed at the 2:30 o'clock position, 6 cm from the nipple. Ultrasound showed an irregular hypoechoic mass measuring 2.8 cm. The left axilla showed no evidence of adenopathy.  On 10/11/2013 the patient underwent left breast biopsy, with the pathology [facility] [id number]) showing an invasive ductal carcinoma, high-grade, estrogen receptor 100% positive, progesterone receptor 43% positive, both with strong staining intensity, with an MIB-1 of 87% and HER-2 amplified, the signals ratio being 2.72, the number per cell 3.40.  On 10/23/2013 the patient underwent bilateral breast MRI. This showed the breast and position to be density be. In the left breast there was a 3 cm irregular enhancing mass in the upper outer quadrant. There was a 9 mm oval mass in the inner lower left breast with no definite fatty hilum. There was a 1.4 cm upper left axillary lymph node with a thickened cortex. There were no other abnormal appearing lymph nodes. Ultrasound of the left breast 10/30/2013 found of the left axillary lymph node with the mildly asymmetrical cortex, and a 7 mm her mass in the 7:30 o'clock position of the left breast. Biopsy of both these suspicious areas was performed 10/30/2013, and the preliminary report is that they're  both benign.  The patient's subsequent history is as detailed below  INTERVAL HISTORY: April Walker returns alone today for followup of her locally advanced left breast carcinoma. She continues to receive neoadjuvant therapy, with today being day 1 cycle 3 of 6 planned q. three-week doses of docetaxel/carboplatin given with trastuzumab and pertuzumab.  She receives Neulasta on day 2 for granulocyte support.  April Walker is doing well today.  She is here for evaluation prior to receiving her third cycle of treatment.  She is tolerating her treatment relatively well.  She tells me that with her first cycle she had significant diarrhea that didn't respond very well to Imodium.  Starting with her second cycle she has been taking Imodium and Questran, and her diarrhea, though present is much more manageable.  Her appetite remains "good" as well as her fluid intake.  She denies fevers, chills, nausea, vomiting, numbness/tingling, skin changes, fluid retention, or any further concerns.    REVIEW OF SYSTEMS: A 10 point review of systems was conducted and is otherwise negative except for what is noted above.    PAST MEDICAL HISTORY: Past Medical History  Diagnosis Date  . Abnormal cholesterol test   . Hyperlipidemia   . Knee pain, left   . GERD (gastroesophageal reflux disease)   . Arthritis   . Tubulovillous adenoma of colon   . Diverticulosis   . Internal hemorrhoids   . Wears glasses   . Hearing deficit     no hearing aids    PAST SURGICAL HISTORY: Past Surgical History  Procedure Laterality Date  . Tonsillectomy and adenoidectomy    . Right knee arthroscopy  2011  Dr. Aline Brochure  . Treatment fistula anal    . Upper gastrointestinal endoscopy    . Hemorrhoid surgery    . Colonoscopy  2002    hemorrhoids  . Colonoscopy w/ polypectomy  12/09/2010    1 cm sigmoid polyp - , diverticulosis, hemorrhoids  . Knee surgery    . Knee surgery  2012    Left knee arthroscopy  . Total knee arthroplasty   11/23/2011    Procedure: TOTAL KNEE ARTHROPLASTY;  Surgeon: Carole Civil, MD;  Location: [facility];  Service: Orthopedics;  Laterality: Left;  . Portacath placement Left 11/17/2013    Procedure: INSERTION PORT-A-CATH;  Surgeon: Stark Klein, MD;  Location: [place];  Service: General;  Laterality: Left;    FAMILY HISTORY Family History  Problem Relation Age of Onset  . Adopted: Yes  . Stroke Mother   . Heart attack Father    the patient is adopted and has no information on her biological family. the patient's adoptive father died at the age of 85 from a myocardial infarction; productive mother died from a stroke at age 62. The patient does not know of any siblings  GYNECOLOGIC HISTORY: (Reviewed 12/18/2013) Menarche age 32. The patient is GX P0. She underwent menopause in her early 77s and took hormone replacement until 2010.  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 She is single, and lives at home with her black lab, [name], and her cats Patches and [name]    ADVANCED DIRECTIVES: Not in place   HEALTH MAINTENANCE:  (Reviewed 12/18/2013) History  Substance Use Topics  . Smoking status: Never Smoker   . Smokeless tobacco: Never Used  . Alcohol Use: Yes     Comment: socially     Colonoscopy: 2012/[name]  PAP: 2014/[name]  Bone density: not on file  Lipid panel: not on file   Allergies  Allergen Reactions  . Sulfonamide Derivatives Anaphylaxis and Hives    Pt. Stated, "large welps on neck immediately."  . Triprolidine-Pse Other (See Comments)    This is Actifed. Increased heart rate to tachycardia.    Current Outpatient Prescriptions  Medication Sig Dispense Refill  . dexamethasone (DECADRON) 4 MG tablet 2 tabs by mouth with food twice daily on day before and 3 days after each chemo cycle  60 tablet  1  . lidocaine-prilocaine (EMLA) cream Apply to port 1-2 hrs  before each procedure as directed  30 g  6  . simvastatin (ZOCOR) 10 MG tablet Take 10 mg by mouth daily. Pt TAKES AT BEDTIME      . Acetaminophen (TYLENOL EXTRA STRENGTH PO) Take by mouth.      Marland Kitchen acyclovir (ZOVIRAX) 400 MG tablet Take 1 tablet (400 mg total) by mouth 2 (two) times daily.  60 tablet  2  . cholestyramine (QUESTRAN) 4 GM/DOSE powder Take 0.5 packets (2 g total) by mouth 2 (two) times daily as needed.  378 g  12  . hydrocortisone (ANUSOL-HC) 25 MG suppository Place 1 suppository (25 mg total) rectally at bedtime as needed for hemorrhoids or itching.  10 suppository  0  . loratadine (CLARITIN) 10 MG tablet Take 10 mg by mouth daily as needed. For allergies      . LORazepam (ATIVAN) 0.5 MG tablet Take 1 tablet (0.5 mg total) by mouth at bedtime as needed for anxiety or sleep (or nausea).  30 tablet  0  .  omeprazole (PRILOSEC) 20 MG capsule Take 20 mg by mouth as needed.      . ondansetron (ZOFRAN) 8 MG tablet 1 tab by mouth twice daily x 3 days after chemo, then 1 tab every 12 hrs if needed for nausea  30 tablet  1  . oxyCODONE-acetaminophen (ROXICET) 5-325 MG per tablet Take 1-2 tablets by mouth every 4 (four) hours as needed for severe pain.  30 tablet  0  . prochlorperazine (COMPAZINE) 10 MG tablet 1 tab by mouth with meals and at bedtime x 3 days after chemo, then 1 tab by mouth every 6 hrs if needed for nausea  30 tablet  3   No current facility-administered medications for this visit.    OBJECTIVE: Middle-aged white woman who appears comfortable and is in no acute distress Filed Vitals:   01/01/14 1121  BP: 134/80  Pulse: 83  Temp: 98.3 F (36.8 C)  Resp: 18     Body mass index is 33.43 kg/(m^2).    ECOG FS:1 - Symptomatic but completely ambulatory Filed Weights   01/01/14 1121  Weight: 219 lb 12.8 oz (99.701 kg)   Physical Exam: HEENT:  Sclerae anicteric.  Oropharynx pink and moist. There is evidence of a drying vesicle on the lower outer right lip. No ulcerations or  mucositis inside the mouth. No evidence of candidiasis. Neck is supple, trachea midline.  NODES:  No cervical or supraclavicular lymphadenopathy palpated.  BREAST EXAM: Breast exam was deferred. Axillae are benign bilaterally, no palpable lymphadenopathy noted. LUNGS:  Clear to auscultation bilaterally.  No wheezes or rhonchi HEART:  Regular rate and rhythm. No murmur  ABDOMEN:  Soft, obese, nontender. No organomegaly or masses palpated. Positive bowel sounds.  MSK:  No focal spinal tenderness to palpation. Good range of motion bilaterally in the upper extremities. EXTREMITIES:  No peripheral edema.  No lymphedema in the left upper extremity. SKIN:  No visible rashes. No nail dyscrasia. No excessive ecchymoses. No petechiae. No pallor. NEURO:  Nonfocal. Well oriented.  Appropriate affect.     LAB RESULTS:   Lab Results  Component Value Date   WBC 5.7 01/01/2014   NEUTROABS 4.7 01/01/2014   HGB 11.7 01/01/2014   HCT 35.7 01/01/2014   MCV 82.7 01/01/2014   PLT 213 01/01/2014      Chemistry      Component Value Date/Time   NA 142 01/01/2014 1056   NA 135 11/24/2011 0513   K 4.1 01/01/2014 1056   K 3.9 11/24/2011 0513   CL 102 11/24/2011 0513   CO2 23 01/01/2014 1056   CO2 25 11/24/2011 0513   BUN 14.2 01/01/2014 1056   BUN 11 11/24/2011 0513   CREATININE 0.8 01/01/2014 1056   CREATININE 0.54 11/24/2011 0513      Component Value Date/Time   CALCIUM 9.7 01/01/2014 1056   CALCIUM 9.0 11/24/2011 0513   ALKPHOS 105 01/01/2014 1056   AST 17 01/01/2014 1056   ALT 21 01/01/2014 1056   BILITOT 0.58 01/01/2014 1056       STUDIES:  Echocardiogram on 11/14/2013 shows an EF 55-60%   ASSESSMENT: 63 y.o. [place] woman status post left breast biopsy 10/11/2013 for a clinical T2 N0, stage IIA invasive ductal carcinoma, grade 3, estrogen and progesterone receptor positive, with an MIB-1 of 87%, and HER-2 amplified  (a) additional Left breast and left axillary node biopsy 10/30/2013 benign  (1)  started neoadjuvant therapy 11/20/2013 , consisting of carboplatin, docetaxel, trastuzumab and pertuzumab every 3 weeks x6  with Neulasta day 2  (2) trastuzumab alone to continue to complete a year  (3) surgery to follow chemotherapy  (4) radiation to follow surgery  (5) anti-estrogens to follow radiation  PLAN:  April Walker is doing well today.  Her labs are stable today.  I reviewed them with her in detail.  She will proceed with chemotherapy today.    Her last echocardiogram was on 11/14/13 and demonstrated a LVEF of 55-60%.  I reviewed with her the role of cardio-onc. And referred her to Dr. Ardyth Man. Aundra Dubin today for close monitoring of her echocardiograms while receiving Herceptin and Perjeta.  The patient is in agreement.   April Walker will return tomorrow for Neulasta, on 01/08/14 for an MRI of her breasts, 7/7 for labs and evaluation of chemotoxicities, and 01/26/14 for evaluation by Dr. Barry Dienes.  Her anticipated chemotherapy completion date is 03/05/14.  I spent 25 minutes counseling the patient face to face.  The total time spent in the appointment was 30 minutes.  Minette Headland, [place] [phone] 01/01/2014 12:14 PM

## 2014-01-01 NOTE — Patient Instructions (Signed)
[facility] Discharge Instructions for Patients Receiving Chemotherapy  Today you received the following chemotherapy agents: Taxotere, Carboplatin, Herceptin, Perjeta. To help prevent nausea and vomiting after your treatment, we encourage you to take your nausea medication.   If you develop nausea and vomiting that is not controlled by your nausea medication, call the clinic.   BELOW ARE SYMPTOMS THAT SHOULD BE REPORTED IMMEDIATELY:  *FEVER GREATER THAN 100.5 F  *CHILLS WITH OR WITHOUT FEVER  NAUSEA AND VOMITING THAT IS NOT CONTROLLED WITH YOUR NAUSEA MEDICATION  *UNUSUAL SHORTNESS OF BREATH  *UNUSUAL BRUISING OR BLEEDING  TENDERNESS IN MOUTH AND THROAT WITH OR WITHOUT PRESENCE OF ULCERS  *URINARY PROBLEMS  *BOWEL PROBLEMS  UNUSUAL RASH Items with * indicate a potential emergency and should be followed up as soon as possible.  Feel free to call the clinic you have any questions or concerns. The clinic phone number is [phone].

## 2014-01-02 ENCOUNTER — Telehealth: Payer: Self-pay | Admitting: *Deleted

## 2014-01-02 ENCOUNTER — Ambulatory Visit (HOSPITAL_BASED_OUTPATIENT_CLINIC_OR_DEPARTMENT_OTHER): Payer: Medicare Other

## 2014-01-02 VITALS — BP 138/71 | HR 81 | Temp 98.1°F

## 2014-01-02 DIAGNOSIS — Z5189 Encounter for other specified aftercare: Secondary | ICD-10-CM

## 2014-01-02 DIAGNOSIS — C50412 Malignant neoplasm of upper-outer quadrant of left female breast: Secondary | ICD-10-CM

## 2014-01-02 DIAGNOSIS — C50419 Malignant neoplasm of upper-outer quadrant of unspecified female breast: Secondary | ICD-10-CM

## 2014-01-02 MED ORDER — PEGFILGRASTIM INJECTION 6 MG/0.6ML
6.0000 mg | Freq: Once | SUBCUTANEOUS | Status: AC
  Administered 2014-01-02: 6 mg via SUBCUTANEOUS
  Filled 2014-01-02: qty 0.6

## 2014-01-02 NOTE — Telephone Encounter (Signed)
Per staff message and POF I have scheduled appts. Advised scheduler of appts. Advised scheduler that the appts for 8/10 MD appt is to late for 4hr treatment.  JMW

## 2014-01-02 NOTE — Telephone Encounter (Signed)
Open by misake 

## 2014-01-02 NOTE — Progress Notes (Unsigned)
Error

## 2014-01-04 ENCOUNTER — Encounter: Payer: Self-pay | Admitting: Oncology

## 2014-01-04 NOTE — Progress Notes (Signed)
Insurance is paying 100% for Neulasta

## 2014-01-08 ENCOUNTER — Ambulatory Visit: Payer: 59

## 2014-01-08 ENCOUNTER — Ambulatory Visit (HOSPITAL_COMMUNITY)
Admission: RE | Admit: 2014-01-08 | Discharge: 2014-01-08 | Disposition: A | Discharge status: Home / Self Care | Payer: Medicare Other | Source: Ambulatory Visit | Attending: Oncology | Admitting: Oncology

## 2014-01-08 DIAGNOSIS — C50919 Malignant neoplasm of unspecified site of unspecified female breast: Secondary | ICD-10-CM | POA: Insufficient documentation

## 2014-01-08 DIAGNOSIS — C50412 Malignant neoplasm of upper-outer quadrant of left female breast: Secondary | ICD-10-CM

## 2014-01-08 MED ORDER — GADOBENATE DIMEGLUMINE 529 MG/ML IV SOLN
20.0000 mL | Freq: Once | INTRAVENOUS | Status: AC
  Administered 2014-01-08: 20 mL via INTRAVENOUS

## 2014-01-09 ENCOUNTER — Ambulatory Visit: Payer: 59 | Admitting: Physician Assistant

## 2014-01-09 ENCOUNTER — Telehealth: Payer: Self-pay | Admitting: Oncology

## 2014-01-09 ENCOUNTER — Other Ambulatory Visit: Payer: 59

## 2014-01-09 ENCOUNTER — Encounter: Payer: Self-pay | Admitting: Oncology

## 2014-01-09 ENCOUNTER — Telehealth: Payer: Self-pay | Admitting: *Deleted

## 2014-01-09 ENCOUNTER — Ambulatory Visit (HOSPITAL_BASED_OUTPATIENT_CLINIC_OR_DEPARTMENT_OTHER): Payer: Medicare Other | Admitting: Oncology

## 2014-01-09 ENCOUNTER — Other Ambulatory Visit (HOSPITAL_BASED_OUTPATIENT_CLINIC_OR_DEPARTMENT_OTHER): Payer: Medicare Other

## 2014-01-09 VITALS — BP 153/75 | HR 104 | Temp 97.6°F | Resp 18 | Ht 68.0 in | Wt 215.3 lb

## 2014-01-09 DIAGNOSIS — C50412 Malignant neoplasm of upper-outer quadrant of left female breast: Secondary | ICD-10-CM

## 2014-01-09 DIAGNOSIS — C50419 Malignant neoplasm of upper-outer quadrant of unspecified female breast: Secondary | ICD-10-CM

## 2014-01-09 DIAGNOSIS — C773 Secondary and unspecified malignant neoplasm of axilla and upper limb lymph nodes: Secondary | ICD-10-CM

## 2014-01-09 DIAGNOSIS — Z170 Estrogen receptor positive status [ER+]: Secondary | ICD-10-CM

## 2014-01-09 LAB — CBC WITH DIFFERENTIAL/PLATELET
BASO%: 0.4 % (ref 0.0–2.0)
Basophils Absolute: 0.1 10*3/uL (ref 0.0–0.1)
EOS%: 0.1 % (ref 0.0–7.0)
Eosinophils Absolute: 0 10*3/uL (ref 0.0–0.5)
HCT: 35.9 % (ref 34.8–46.6)
HGB: 11.7 g/dL (ref 11.6–15.9)
LYMPH%: 16.1 % (ref 14.0–49.7)
MCH: 27.2 pg (ref 25.1–34.0)
MCHC: 32.7 g/dL (ref 31.5–36.0)
MCV: 83.1 fL (ref 79.5–101.0)
MONO#: 1.4 10*3/uL — ABNORMAL HIGH (ref 0.1–0.9)
MONO%: 8.3 % (ref 0.0–14.0)
NEUT#: 13 10*3/uL — ABNORMAL HIGH (ref 1.5–6.5)
NEUT%: 75.1 % (ref 38.4–76.8)
Platelets: 293 10*3/uL (ref 145–400)
RBC: 4.32 10*6/uL (ref 3.70–5.45)
RDW: 18.7 % — AB (ref 11.2–14.5)
WBC: 17.4 10*3/uL — AB (ref 3.9–10.3)
lymph#: 2.8 10*3/uL (ref 0.9–3.3)

## 2014-01-09 LAB — COMPREHENSIVE METABOLIC PANEL (CC13)
ALT: 25 U/L (ref 0–55)
ANION GAP: 9 meq/L (ref 3–11)
AST: 19 U/L (ref 5–34)
Albumin: 3.8 g/dL (ref 3.5–5.0)
Alkaline Phosphatase: 136 U/L (ref 40–150)
BUN: 14 mg/dL (ref 7.0–26.0)
CALCIUM: 9.4 mg/dL (ref 8.4–10.4)
CHLORIDE: 107 meq/L (ref 98–109)
CO2: 27 meq/L (ref 22–29)
CREATININE: 0.8 mg/dL (ref 0.6–1.1)
GLUCOSE: 130 mg/dL (ref 70–140)
Potassium: 3.1 mEq/L — ABNORMAL LOW (ref 3.5–5.1)
Sodium: 143 mEq/L (ref 136–145)
Total Bilirubin: 0.37 mg/dL (ref 0.20–1.20)
Total Protein: 6.7 g/dL (ref 6.4–8.3)

## 2014-01-09 NOTE — Telephone Encounter (Signed)
cld pt & left a message that sch has been updated-adv pt i woud mail out sch to address on acct

## 2014-01-09 NOTE — Telephone Encounter (Signed)
Per staff message and POF I have scheduled appts. Advised scheduler of appts. JMW  

## 2014-01-09 NOTE — Progress Notes (Signed)
April Walker  Telephone:(336) [phone] Fax:(336) [phone]     ID: April Walker OB: Jul 11, 1950  MR#: [id number]  GGE#:[id number]  PCP: Criselda Peaches, MD GYN:  Vania Rea SU: Stark Klein  OTHER MD: [name], Silvano Rusk, Arther Abbott  CHIEF COMPLAINT: Left Breast Cancer, locally advanced TREATMENT: under active neoadjuvant chemo-immunotherap   BREAST CANCER HISTORY: April Walker had routine screening mammography at [facility] 09/14/2013. This showed a possible mass in the left breast. On 10/11/2013 she underwent left diagnostic mammography and ultrasonography which confirmed an irregular mass in the upper outer left breast measuring up to 3 cm. This was firm and fixed at the 2:30 o'clock position, 6 cm from the nipple. Ultrasound showed an irregular hypoechoic mass measuring 2.8 cm. The left axilla showed no evidence of adenopathy.  On 10/11/2013 the patient underwent left breast biopsy, with the pathology [facility] [id number]) showing an invasive ductal carcinoma, high-grade, estrogen receptor 100% positive, progesterone receptor 43% positive, both with strong staining intensity, with an MIB-1 of 87% and HER-2 amplified, the signals ratio being 2.72, the number per cell 3.40.  On 10/23/2013 the patient underwent bilateral breast MRI. This showed the breast and position to be density be. In the left breast there was a 3 cm irregular enhancing mass in the upper outer quadrant. There was a 9 mm oval mass in the inner lower left breast with no definite fatty hilum. There was a 1.4 cm upper left axillary lymph node with a thickened cortex. There were no other abnormal appearing lymph nodes. Ultrasound of the left breast 10/30/2013 found of the left axillary lymph node with the mildly asymmetrical cortex, and a 7 mm her mass in the 7:30 o'clock position of the left breast. Biopsy of both these suspicious areas was performed 10/30/2013, and the preliminary report is that they're  both benign.  The patient's subsequent history is as detailed below  INTERVAL HISTORY: April Walker returns alone today for followup of her locally advanced left breast carcinoma. She continues to receive neoadjuvant therapy, with today being day 8 cycle 3 of 6 planned q. three-week doses of docetaxel/carboplatin given with trastuzumab and pertuzumab.  She receives Neulasta on day 2 for granulocyte support.  April Walker is doing well today. She is tolerating her treatment relatively well.  She tells me that with her first cycle she had significant diarrhea that didn't respond very well to Imodium.  Starting with her second cycle she has been taking Imodium and Questran, and her diarrhea, though present is much more manageable.  Her appetite remains good as well as her fluid intake.  She denies fevers, chills, nausea, vomiting, numbness/tingling, skin changes, fluid retention, or any further concerns. She had an MRI of her breast performed yesterday and the results are pending.   REVIEW OF SYSTEMS: A 10 point review of systems was conducted and is otherwise negative except for what is noted above.    PAST MEDICAL HISTORY: Past Medical History  Diagnosis Date  . Abnormal cholesterol test   . Hyperlipidemia   . Knee pain, left   . GERD (gastroesophageal reflux disease)   . Arthritis   . Tubulovillous adenoma of colon   . Diverticulosis   . Internal hemorrhoids   . Wears glasses   . Hearing deficit     no hearing aids    PAST SURGICAL HISTORY: Past Surgical History  Procedure Laterality Date  . Tonsillectomy and adenoidectomy    . Right knee arthroscopy  2011 Dr. Aline Brochure  .  Treatment fistula anal    . Upper gastrointestinal endoscopy    . Hemorrhoid surgery    . Colonoscopy  2002    hemorrhoids  . Colonoscopy w/ polypectomy  12/09/2010    1 cm sigmoid polyp - , diverticulosis, hemorrhoids  . Knee surgery    . Knee surgery  2012    Left knee arthroscopy  . Total knee arthroplasty   11/23/2011    Procedure: TOTAL KNEE ARTHROPLASTY;  Surgeon: Carole Civil, MD;  Location: [facility];  Service: Orthopedics;  Laterality: Left;  . Portacath placement Left 11/17/2013    Procedure: INSERTION PORT-A-CATH;  Surgeon: Stark Klein, MD;  Location: [place];  Service: General;  Laterality: Left;    FAMILY HISTORY Family History  Problem Relation Age of Onset  . Adopted: Yes  . Stroke Mother   . Heart attack Father    the patient is adopted and has no information on her biological family. the patient's adoptive father died at the age of 46 from a myocardial infarction; productive mother died from a stroke at age 20. The patient does not know of any siblings  GYNECOLOGIC HISTORY: (Reviewed 12/18/2013) Menarche age 1. The patient is GX P0. She underwent menopause in her early 28s and took hormone replacement until 2010.  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 She is single, and lives at home with her black lab, April Walker, and her cats April Walker and April Walker    ADVANCED DIRECTIVES: Not in place   HEALTH MAINTENANCE:  (Reviewed 12/18/2013) History  Substance Use Topics  . Smoking status: Never Smoker   . Smokeless tobacco: Never Used  . Alcohol Use: Yes     Comment: socially     Colonoscopy: 2012/[name]  PAP: 2014/[name]  Bone density: not on file  Lipid panel: not on file   Allergies  Allergen Reactions  . Sulfonamide Derivatives Anaphylaxis and Hives    Pt. Stated, "large welps on neck immediately."  . Triprolidine-Pse Other (See Comments)    This is Actifed. Increased heart rate to tachycardia.    Current Outpatient Prescriptions  Medication Sig Dispense Refill  . Acetaminophen (TYLENOL EXTRA STRENGTH PO) Take by mouth.      Marland Kitchen acyclovir (ZOVIRAX) 400 MG tablet Take 1 tablet (400 mg total) by mouth 2 (two) times daily.  60 tablet  2  . cholestyramine (QUESTRAN)  4 GM/DOSE powder Take 0.5 packets (2 g total) by mouth 2 (two) times daily as needed.  378 g  12  . dexamethasone (DECADRON) 4 MG tablet 2 tabs by mouth with food twice daily on day before and 3 days after each chemo cycle  60 tablet  1  . hydrocortisone (ANUSOL-HC) 25 MG suppository Place 1 suppository (25 mg total) rectally at bedtime as needed for hemorrhoids or itching.  10 suppository  0  . lidocaine-prilocaine (EMLA) cream Apply to port 1-2 hrs before each procedure as directed  30 g  6  . loratadine (CLARITIN) 10 MG tablet Take 10 mg by mouth daily as needed. For allergies      . LORazepam (ATIVAN) 0.5 MG tablet Take 1 tablet (0.5 mg total) by mouth at bedtime as needed for anxiety or sleep (or nausea).  30 tablet  0  . omeprazole (PRILOSEC) 20 MG capsule Take 20 mg by mouth as needed.      . ondansetron (ZOFRAN) 8 MG tablet 1 tab  by mouth twice daily x 3 days after chemo, then 1 tab every 12 hrs if needed for nausea  30 tablet  1  . oxyCODONE-acetaminophen (ROXICET) 5-325 MG per tablet Take 1-2 tablets by mouth every 4 (four) hours as needed for severe pain.  30 tablet  0  . prochlorperazine (COMPAZINE) 10 MG tablet 1 tab by mouth with meals and at bedtime x 3 days after chemo, then 1 tab by mouth every 6 hrs if needed for nausea  30 tablet  3  . simvastatin (ZOCOR) 10 MG tablet Take 10 mg by mouth daily. Pt TAKES AT BEDTIME       No current facility-administered medications for this visit.    OBJECTIVE: Middle-aged white woman who appears comfortable and is in no acute distress Filed Vitals:   01/09/14 1134  BP: 153/75  Pulse: 104  Temp: 97.6 F (36.4 C)  Resp: 18     Body mass index is 32.74 kg/(m^2).    ECOG FS:1 - Symptomatic but completely ambulatory Filed Weights   01/09/14 1134  Weight: 215 lb 4.8 oz (97.659 kg)   Physical Exam: HEENT:  Sclerae anicteric.  Oropharynx pink and moist. There is evidence of a drying vesicle on the lower outer right lip. No ulcerations or  mucositis inside the mouth. No evidence of candidiasis. Neck is supple, trachea midline.  NODES:  No cervical or supraclavicular lymphadenopathy palpated.  BREAST EXAM: Breast exam was deferred. Axillae are benign bilaterally, no palpable lymphadenopathy noted. LUNGS:  Clear to auscultation bilaterally.  No wheezes or rhonchi HEART:  Regular rate and rhythm. No murmur  ABDOMEN:  Soft, obese, nontender. No organomegaly or masses palpated. Positive bowel sounds.  MSK:  No focal spinal tenderness to palpation. Good range of motion bilaterally in the upper extremities. EXTREMITIES:  No peripheral edema.  No lymphedema in the left upper extremity. SKIN:  No visible rashes. No nail dyscrasia. No excessive ecchymoses. No petechiae. No pallor. NEURO:  Nonfocal. Well oriented.  Appropriate affect.     LAB RESULTS:   Lab Results  Component Value Date   WBC 17.4* 01/09/2014   NEUTROABS 13.0* 01/09/2014   HGB 11.7 01/09/2014   HCT 35.9 01/09/2014   MCV 83.1 01/09/2014   PLT 293 01/09/2014      Chemistry      Component Value Date/Time   NA 143 01/09/2014 1117   NA 135 11/24/2011 0513   K 3.1* 01/09/2014 1117   K 3.9 11/24/2011 0513   CL 102 11/24/2011 0513   CO2 27 01/09/2014 1117   CO2 25 11/24/2011 0513   BUN 14.0 01/09/2014 1117   BUN 11 11/24/2011 0513   CREATININE 0.8 01/09/2014 1117   CREATININE 0.54 11/24/2011 0513      Component Value Date/Time   CALCIUM 9.4 01/09/2014 1117   CALCIUM 9.0 11/24/2011 0513   ALKPHOS 136 01/09/2014 1117   AST 19 01/09/2014 1117   ALT 25 01/09/2014 1117   BILITOT 0.37 01/09/2014 1117       STUDIES:  Echocardiogram on 11/14/2013 shows an EF 55-60%   ASSESSMENT: 63 y.o. April Walker woman status post left breast biopsy 10/11/2013 for a clinical T2 N0, stage IIA invasive ductal carcinoma, grade 3, estrogen and progesterone receptor positive, with an MIB-1 of 87%, and HER-2 amplified  (a) additional Left breast and left axillary node biopsy 10/30/2013 benign  (1) started  neoadjuvant therapy 11/20/2013 , consisting of carboplatin, docetaxel, trastuzumab and pertuzumab every 3 weeks x6 with Neulasta day 2  (  2) trastuzumab alone to continue to complete a year  (3) surgery to follow chemotherapy  (4) radiation to follow surgery  (5) anti-estrogens to follow radiation  PLAN:  April Walker is doing well today. She is tolerating her chemotherapy well. Her diarrhea is managed well with Imodium and Questran. Her labs are stable.     The results of her breast MRI are pending. I will see her back on July 20 for her next cycle of chemotherapy. She will be seen on 01/26/14 for evaluation by Dr. Barry Dienes.  Her anticipated chemotherapy completion date is 03/05/14.  I spent 15 minutes counseling the patient face to face.  The total time spent in the appointment was 20 minutes.  Mikey Bussing, DNP, AGPCNP-BC  01/09/2014 12:57 PM

## 2014-01-11 ENCOUNTER — Ambulatory Visit (INDEPENDENT_AMBULATORY_CARE_PROVIDER_SITE_OTHER): Payer: Medicare Other | Admitting: Orthopedic Surgery

## 2014-01-11 ENCOUNTER — Ambulatory Visit (INDEPENDENT_AMBULATORY_CARE_PROVIDER_SITE_OTHER): Payer: Medicare Other

## 2014-01-11 VITALS — BP 116/70 | Ht 68.0 in | Wt 215.3 lb

## 2014-01-11 DIAGNOSIS — Z96659 Presence of unspecified artificial knee joint: Secondary | ICD-10-CM

## 2014-01-11 DIAGNOSIS — Z96652 Presence of left artificial knee joint: Secondary | ICD-10-CM

## 2014-01-11 DIAGNOSIS — M1710 Unilateral primary osteoarthritis, unspecified knee: Secondary | ICD-10-CM

## 2014-01-11 DIAGNOSIS — IMO0002 Reserved for concepts with insufficient information to code with codable children: Secondary | ICD-10-CM

## 2014-01-11 NOTE — Patient Instructions (Signed)
Activities as tolerated. Followup in a year for x-rays

## 2014-01-11 NOTE — Progress Notes (Signed)
Subjective:     Patient ID: April Walker, female   DOB: 06-Oct-1950, 63 y.o.   MRN: [id number]  HPI Chief Complaint  Patient presents with  . Follow-up    1 year follow up/ xray left knee, May 2013 date of surgery    Annual tka f/u   No complaints of pain / swelling   Review of Systems mild symptoms from chemo, otherwise normal      Objective:   Physical Exam BP 116/70  Ht 5\' 8"  (1.727 m)  Wt 215 lb 4.8 oz (97.659 kg)  BMI 32.74 kg/m2 General appearance is normal, the patient is alert and oriented x3 with normal mood and affect.  The LEFT lower extremity is examined.  Inspection is normal, range of motion is full.  All ligaments are stable.  Strength is normal.  Skin is intact.    Assessment:     Normal total knee x-ray at 2 years postop    Plan:     Followup one-year annual x-ray

## 2014-01-19 ENCOUNTER — Other Ambulatory Visit (HOSPITAL_COMMUNITY): Payer: Self-pay | Admitting: Cardiology

## 2014-01-19 DIAGNOSIS — C50919 Malignant neoplasm of unspecified site of unspecified female breast: Secondary | ICD-10-CM

## 2014-01-22 ENCOUNTER — Other Ambulatory Visit: Payer: Self-pay | Admitting: Oncology

## 2014-01-22 ENCOUNTER — Ambulatory Visit (HOSPITAL_BASED_OUTPATIENT_CLINIC_OR_DEPARTMENT_OTHER): Payer: Medicare Other

## 2014-01-22 ENCOUNTER — Encounter: Payer: Self-pay | Admitting: Oncology

## 2014-01-22 ENCOUNTER — Ambulatory Visit (HOSPITAL_BASED_OUTPATIENT_CLINIC_OR_DEPARTMENT_OTHER): Payer: Medicare Other | Admitting: Oncology

## 2014-01-22 VITALS — BP 151/65 | HR 93 | Temp 98.6°F | Resp 18 | Ht 68.0 in | Wt 221.1 lb

## 2014-01-22 DIAGNOSIS — C50412 Malignant neoplasm of upper-outer quadrant of left female breast: Secondary | ICD-10-CM

## 2014-01-22 DIAGNOSIS — C50419 Malignant neoplasm of upper-outer quadrant of unspecified female breast: Secondary | ICD-10-CM

## 2014-01-22 DIAGNOSIS — Z5111 Encounter for antineoplastic chemotherapy: Secondary | ICD-10-CM

## 2014-01-22 DIAGNOSIS — Z170 Estrogen receptor positive status [ER+]: Secondary | ICD-10-CM

## 2014-01-22 DIAGNOSIS — Z5112 Encounter for antineoplastic immunotherapy: Secondary | ICD-10-CM

## 2014-01-22 LAB — CBC WITH DIFFERENTIAL/PLATELET
BASO%: 0.3 % (ref 0.0–2.0)
BASOS ABS: 0 10*3/uL (ref 0.0–0.1)
EOS%: 0 % (ref 0.0–7.0)
Eosinophils Absolute: 0 10*3/uL (ref 0.0–0.5)
HCT: 34 % — ABNORMAL LOW (ref 34.8–46.6)
HEMOGLOBIN: 11.3 g/dL — AB (ref 11.6–15.9)
LYMPH%: 10.5 % — AB (ref 14.0–49.7)
MCH: 28.1 pg (ref 25.1–34.0)
MCHC: 33.1 g/dL (ref 31.5–36.0)
MCV: 84.9 fL (ref 79.5–101.0)
MONO#: 0.2 10*3/uL (ref 0.1–0.9)
MONO%: 1.9 % (ref 0.0–14.0)
NEUT#: 9.2 10*3/uL — ABNORMAL HIGH (ref 1.5–6.5)
NEUT%: 87.3 % — AB (ref 38.4–76.8)
PLATELETS: 250 10*3/uL (ref 145–400)
RBC: 4 10*6/uL (ref 3.70–5.45)
RDW: 19.5 % — ABNORMAL HIGH (ref 11.2–14.5)
WBC: 10.6 10*3/uL — ABNORMAL HIGH (ref 3.9–10.3)
lymph#: 1.1 10*3/uL (ref 0.9–3.3)

## 2014-01-22 LAB — COMPREHENSIVE METABOLIC PANEL (CC13)
ALK PHOS: 93 U/L (ref 40–150)
ALT: 18 U/L (ref 0–55)
AST: 16 U/L (ref 5–34)
Albumin: 3.9 g/dL (ref 3.5–5.0)
Anion Gap: 12 mEq/L — ABNORMAL HIGH (ref 3–11)
BILIRUBIN TOTAL: 0.6 mg/dL (ref 0.20–1.20)
BUN: 12.5 mg/dL (ref 7.0–26.0)
CO2: 21 mEq/L — ABNORMAL LOW (ref 22–29)
CREATININE: 0.8 mg/dL (ref 0.6–1.1)
Calcium: 9.9 mg/dL (ref 8.4–10.4)
Chloride: 110 mEq/L — ABNORMAL HIGH (ref 98–109)
GLUCOSE: 221 mg/dL — AB (ref 70–140)
POTASSIUM: 3.9 meq/L (ref 3.5–5.1)
Sodium: 143 mEq/L (ref 136–145)
Total Protein: 7 g/dL (ref 6.4–8.3)

## 2014-01-22 MED ORDER — TRASTUZUMAB CHEMO INJECTION 440 MG
6.0000 mg/kg | Freq: Once | INTRAVENOUS | Status: AC
  Administered 2014-01-22: 588 mg via INTRAVENOUS
  Filled 2014-01-22: qty 28

## 2014-01-22 MED ORDER — SODIUM CHLORIDE 0.9 % IV SOLN
564.0000 mg | Freq: Once | INTRAVENOUS | Status: AC
  Administered 2014-01-22: 560 mg via INTRAVENOUS
  Filled 2014-01-22: qty 56

## 2014-01-22 MED ORDER — DEXAMETHASONE SODIUM PHOSPHATE 20 MG/5ML IJ SOLN
20.0000 mg | Freq: Once | INTRAMUSCULAR | Status: AC
  Administered 2014-01-22: 20 mg via INTRAVENOUS

## 2014-01-22 MED ORDER — SODIUM CHLORIDE 0.9 % IV SOLN
Freq: Once | INTRAVENOUS | Status: AC
  Administered 2014-01-22: 10:00:00 via INTRAVENOUS

## 2014-01-22 MED ORDER — SODIUM CHLORIDE 0.9 % IV SOLN
420.0000 mg | Freq: Once | INTRAVENOUS | Status: AC
  Administered 2014-01-22: 420 mg via INTRAVENOUS
  Filled 2014-01-22: qty 14

## 2014-01-22 MED ORDER — DIPHENHYDRAMINE HCL 25 MG PO CAPS
ORAL_CAPSULE | ORAL | Status: AC
  Filled 2014-01-22: qty 1

## 2014-01-22 MED ORDER — HEPARIN SOD (PORK) LOCK FLUSH 100 UNIT/ML IV SOLN
500.0000 [IU] | Freq: Once | INTRAVENOUS | Status: AC | PRN
  Administered 2014-01-22: 500 [IU]
  Filled 2014-01-22: qty 5

## 2014-01-22 MED ORDER — ACETAMINOPHEN 325 MG PO TABS
650.0000 mg | ORAL_TABLET | Freq: Once | ORAL | Status: AC
  Administered 2014-01-22: 650 mg via ORAL

## 2014-01-22 MED ORDER — ACETAMINOPHEN 325 MG PO TABS
ORAL_TABLET | ORAL | Status: AC
  Filled 2014-01-22: qty 2

## 2014-01-22 MED ORDER — DEXAMETHASONE SODIUM PHOSPHATE 20 MG/5ML IJ SOLN
INTRAMUSCULAR | Status: AC
  Filled 2014-01-22: qty 5

## 2014-01-22 MED ORDER — ONDANSETRON 16 MG/50ML IVPB (CHCC)
INTRAVENOUS | Status: AC
  Filled 2014-01-22: qty 16

## 2014-01-22 MED ORDER — DOCETAXEL CHEMO INJECTION 160 MG/16ML
75.0000 mg/m2 | Freq: Once | INTRAVENOUS | Status: AC
  Administered 2014-01-22: 160 mg via INTRAVENOUS
  Filled 2014-01-22: qty 16

## 2014-01-22 MED ORDER — SODIUM CHLORIDE 0.9 % IJ SOLN
10.0000 mL | INTRAMUSCULAR | Status: DC | PRN
  Administered 2014-01-22: 10 mL
  Filled 2014-01-22: qty 10

## 2014-01-22 MED ORDER — ONDANSETRON 16 MG/50ML IVPB (CHCC)
16.0000 mg | Freq: Once | INTRAVENOUS | Status: AC
  Administered 2014-01-22: 16 mg via INTRAVENOUS

## 2014-01-22 MED ORDER — DIPHENHYDRAMINE HCL 25 MG PO CAPS
25.0000 mg | ORAL_CAPSULE | Freq: Once | ORAL | Status: AC
  Administered 2014-01-22: 25 mg via ORAL

## 2014-01-22 MED ORDER — SODIUM CHLORIDE 0.9 % IV SOLN: Freq: Once | INTRAVENOUS | Status: DC

## 2014-01-22 NOTE — Progress Notes (Signed)
[place]  [phone] [phone]     ID: ANNIBELLE BRAZIE OB: [birth year]  MR#: [id number]  WUX#:[id number]  PCP: Criselda Peaches, MD GYN:  Vania Rea SU: Stark Klein  OTHER MD: [name], Silvano Rusk, Arther Abbott  CHIEF COMPLAINT: Left Breast Cancer, locally advanced TREATMENT: under active neoadjuvant chemo-immunotherap   BREAST CANCER HISTORY: April Walker had routine screening mammography at [facility] 09/14/2013. This showed a possible mass in the left breast. On 10/11/2013 she underwent left diagnostic mammography and ultrasonography which confirmed an irregular mass in the upper outer left breast measuring up to 3 cm. This was firm and fixed at the 2:30 o'clock position, 6 cm from the nipple. Ultrasound showed an irregular hypoechoic mass measuring 2.8 cm. The left axilla showed no evidence of adenopathy.  On 10/11/2013 the patient underwent left breast biopsy, with the pathology [facility] [phone]) showing an invasive ductal carcinoma, high-grade, estrogen receptor 100% positive, progesterone receptor 43% positive, both with strong staining intensity, with an MIB-1 of 87% and HER-2 amplified, the signals ratio being 2.72, the number per cell 3.40.  On 10/23/2013 the patient underwent bilateral breast MRI. This showed the breast and position to be density be. In the left breast there was a 3 cm irregular enhancing mass in the upper outer quadrant. There was a 9 mm oval mass in the inner lower left breast with no definite fatty hilum. There was a 1.4 cm upper left axillary lymph node with a thickened cortex. There were no other abnormal appearing lymph nodes. Ultrasound of the left breast 10/30/2013 found of the left axillary lymph node with the mildly asymmetrical cortex, and a 7 mm her mass in the 7:30 o'clock position of the left breast. Biopsy of both these suspicious areas was performed 10/30/2013, and the preliminary report is that they're  both benign.  The patient's subsequent history is as detailed below  INTERVAL HISTORY: Cheray returns alone today for followup of her locally advanced left breast carcinoma. She continues to receive neoadjuvant therapy, with today being day 1 cycle 4 of 6 planned q. three-week doses of docetaxel/carboplatin given with trastuzumab and pertuzumab.  She receives Neulasta on day 2 for granulocyte support.  Mrs. Vogan is doing well today. She is tolerating her treatment relatively well.  She tells me that with her first cycle she had significant diarrhea that didn't respond very well to Imodium.  Starting with her second cycle she has been taking Imodium and Questran, and her diarrhea, though present is much more manageable.  Her appetite remains good as well as her fluid intake.  She denies fevers, chills, nausea, vomiting, numbness/tingling, skin changes, fluid retention, or any further concerns.   REVIEW OF SYSTEMS: A 10 point review of systems was conducted and is otherwise negative except for what is noted above.    PAST MEDICAL HISTORY: Past Medical History  Diagnosis Date  . Abnormal cholesterol test   . Hyperlipidemia   . Knee pain, left   . GERD (gastroesophageal reflux disease)   . Arthritis   . Tubulovillous adenoma of colon   . Diverticulosis   . Internal hemorrhoids   . Wears glasses   . Hearing deficit     no hearing aids    PAST SURGICAL HISTORY: Past Surgical History  Procedure Laterality Date  . Tonsillectomy and adenoidectomy    . Right knee arthroscopy  2011 Dr. Aline Brochure  . Treatment fistula anal    . Upper gastrointestinal endoscopy    .  Hemorrhoid surgery    . Colonoscopy  2002    hemorrhoids  . Colonoscopy w/ polypectomy  12/09/2010    1 cm sigmoid polyp - , diverticulosis, hemorrhoids  . Knee surgery    . Knee surgery  2012    Left knee arthroscopy  . Total knee arthroplasty  11/23/2011    Procedure: TOTAL KNEE ARTHROPLASTY;  Surgeon: Carole Civil,  MD;  Location: [facility];  Service: Orthopedics;  Laterality: Left;  . Portacath placement Left 11/17/2013    Procedure: INSERTION PORT-A-CATH;  Surgeon: Stark Klein, MD;  Location: [place];  Service: General;  Laterality: Left;    FAMILY HISTORY Family History  Problem Relation Age of Onset  . Adopted: Yes  . Stroke Mother   . Heart attack Father    the patient is adopted and has no information on her biological family. the patient's adoptive father died at the age of 52 from a myocardial infarction; productive mother died from a stroke at age 55. The patient does not know of any siblings  GYNECOLOGIC HISTORY: (Reviewed 12/18/2013) Menarche age 63. The patient is GX P0. She underwent menopause in her early 87s and took hormone replacement until 2010.  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 She is single, and lives at home with her black lab, [name], and her cats Patches and [name]    ADVANCED DIRECTIVES: Not in place   HEALTH MAINTENANCE:  (Reviewed 12/18/2013) History  Substance Use Topics  . Smoking status: Never Smoker   . Smokeless tobacco: Never Used  . Alcohol Use: Yes     Comment: socially     Colonoscopy: 2012/[name]  PAP: 2014/[name]  Bone density: not on file  Lipid panel: not on file   Allergies  Allergen Reactions  . Sulfonamide Derivatives Anaphylaxis and Hives    Pt. Stated, "large welps on neck immediately."  . Triprolidine-Pse Other (See Comments)    This is Actifed. Increased heart rate to tachycardia.    Current Outpatient Prescriptions  Medication Sig Dispense Refill  . Acetaminophen (TYLENOL EXTRA STRENGTH PO) Take by mouth.      Marland Kitchen acyclovir (ZOVIRAX) 400 MG tablet Take 1 tablet (400 mg total) by mouth 2 (two) times daily.  60 tablet  2  . cholestyramine (QUESTRAN) 4 GM/DOSE powder Take 0.5 packets (2 g total) by mouth 2 (two) times daily as  needed.  378 g  12  . dexamethasone (DECADRON) 4 MG tablet 2 tabs by mouth with food twice daily on day before and 3 days after each chemo cycle  60 tablet  1  . hydrocortisone (ANUSOL-HC) 25 MG suppository Place 1 suppository (25 mg total) rectally at bedtime as needed for hemorrhoids or itching.  10 suppository  0  . lidocaine-prilocaine (EMLA) cream Apply to port 1-2 hrs before each procedure as directed  30 g  6  . loratadine (CLARITIN) 10 MG tablet Take 10 mg by mouth daily as needed. For allergies      . LORazepam (ATIVAN) 0.5 MG tablet Take 1 tablet (0.5 mg total) by mouth at bedtime as needed for anxiety or sleep (or nausea).  30 tablet  0  . omeprazole (PRILOSEC) 20 MG capsule Take 20 mg by mouth as needed.      . ondansetron (ZOFRAN) 8 MG tablet 1 tab by mouth twice daily x 3 days after chemo, then 1 tab every 12  hrs if needed for nausea  30 tablet  1  . oxyCODONE-acetaminophen (ROXICET) 5-325 MG per tablet Take 1-2 tablets by mouth every 4 (four) hours as needed for severe pain.  30 tablet  0  . prochlorperazine (COMPAZINE) 10 MG tablet 1 tab by mouth with meals and at bedtime x 3 days after chemo, then 1 tab by mouth every 6 hrs if needed for nausea  30 tablet  3  . simvastatin (ZOCOR) 10 MG tablet Take 10 mg by mouth daily. Pt TAKES AT BEDTIME       No current facility-administered medications for this visit.    OBJECTIVE: Middle-aged white woman who appears comfortable and is in no acute distress Filed Vitals:   01/22/14 0909  BP: 151/65  Pulse: 93  Temp: 98.6 F (37 C)  Resp: 18     Body mass index is 33.63 kg/(m^2).    ECOG FS:1 - Symptomatic but completely ambulatory Filed Weights   01/22/14 0909  Weight: 221 lb 1.6 oz (100.29 kg)   Physical Exam: HEENT:  Sclerae anicteric.  Oropharynx pink and moist. There is evidence of a drying vesicle on the lower outer right lip. No ulcerations or mucositis inside the mouth. No evidence of candidiasis. Neck is supple, trachea  midline.  NODES:  No cervical or supraclavicular lymphadenopathy palpated.  BREAST EXAM: Breast exam was deferred. Axillae are benign bilaterally, no palpable lymphadenopathy noted. LUNGS:  Clear to auscultation bilaterally.  No wheezes or rhonchi HEART:  Regular rate and rhythm. No murmur  ABDOMEN:  Soft, obese, nontender. No organomegaly or masses palpated. Positive bowel sounds.  MSK:  No focal spinal tenderness to palpation. Good range of motion bilaterally in the upper extremities. EXTREMITIES:  No peripheral edema.  No lymphedema in the left upper extremity. SKIN:  No visible rashes. No nail dyscrasia. No excessive ecchymoses. No petechiae. No pallor. NEURO:  Nonfocal. Well oriented.  Appropriate affect.    LAB RESULTS:   Lab Results  Component Value Date   WBC 10.6* 01/22/2014   NEUTROABS 9.2* 01/22/2014   HGB 11.3* 01/22/2014   HCT 34.0* 01/22/2014   MCV 84.9 01/22/2014   PLT 250 01/22/2014      Chemistry      Component Value Date/Time   NA 143 01/22/2014 0848   NA 135 11/24/2011 0513   K 3.9 01/22/2014 0848   K 3.9 11/24/2011 0513   CL 102 11/24/2011 0513   CO2 21* 01/22/2014 0848   CO2 25 11/24/2011 0513   BUN 12.5 01/22/2014 0848   BUN 11 11/24/2011 0513   CREATININE 0.8 01/22/2014 0848   CREATININE 0.54 11/24/2011 0513      Component Value Date/Time   CALCIUM 9.9 01/22/2014 0848   CALCIUM 9.0 11/24/2011 0513   ALKPHOS 93 01/22/2014 0848   AST 16 01/22/2014 0848   ALT 18 01/22/2014 0848   BILITOT 0.60 01/22/2014 0848       STUDIES:  Echocardiogram on 11/14/2013 shows an EF 55-60%  EXAM:  BILATERAL BREAST MRI WITH AND WITHOUT CONTRAST  TECHNIQUE:  Multiplanar, multisequence MR images of both breasts were obtained  prior to and following the intravenous administration of 74m of  MultiHance.  THREE-DIMENSIONAL MR IMAGE RENDERING ON INDEPENDENT WORKSTATION:  Three-dimensional MR images were rendered by post-processing of the  original MR data on an independent  workstation. The  three-dimensional MR images were interpreted, and findings are  reported in the following complete MRI report for this study. Three  dimensional images were  evaluated at the independent DynaCad  workstation  COMPARISON: Previous exams  FINDINGS:  Breast composition: b. Scattered fibroglandular tissue.  Background parenchymal enhancement: Mild  Right breast: No mass or abnormal enhancement.  Left breast: Previously described mass within the upper-outer left  breast middle depth demonstrates no residual contrast enhancement.  Residual nonenhancing tissue measures 1.8 x 1.3 cm, previously 3.1 x  2.9 cm at the same level.  Susceptibility artifact is demonstrated within the lower inner left  breast anterior depth at the site of previously biopsied enhancing  mass. This was biopsied and represented benign pathology.  No concerning areas of enhancement are identified within the left  breast.  Lymph nodes: No abnormal appearing lymph nodes.  Ancillary findings: None.  IMPRESSION:  No residual enhancement identified within the known left breast  carcinoma.  RECOMMENDATION:  Treatment plan  BI-RADS CATEGORY 6: Known biopsy-proven malignancy.  Electronically Signed  By: Lovey Newcomer M.D.  On: 01/09/2014 12:32    ASSESSMENT: 63 y.o. [place] woman status post left breast biopsy 10/11/2013 for a clinical T2 N0, stage IIA invasive ductal carcinoma, grade 3, estrogen and progesterone receptor positive, with an MIB-1 of 87%, and HER-2 amplified  (a) additional Left breast and left axillary node biopsy 10/30/2013 benign  (1) started neoadjuvant therapy 11/20/2013 , consisting of carboplatin, docetaxel, trastuzumab and pertuzumab every 3 weeks x6 with Neulasta day 2  (2) trastuzumab alone to continue to complete a year  (3) surgery to follow chemotherapy  (4) radiation to follow surgery  (5) anti-estrogens to follow radiation  PLAN:  April Walker is doing well today.  She is tolerating her chemotherapy well. MRI of the breasts reviewed with the patient. She is responding to therapy. Recommend that she proceed with cycle 4 of carboplatin, docetaxel, trastuzumab, and pertuzumab today. She will return tomorrow for a Neulasta injection.   She will be seen on 01/26/14 for evaluation by Dr. Barry Dienes.  Her anticipated chemotherapy completion date is 03/05/14.  I spent 20 minutes counseling the patient face to face.  The total time spent in the appointment was 30 minutes.  Mikey Bussing, DNP, AGPCNP-BC  01/22/2014 9:37 AM

## 2014-01-22 NOTE — Patient Instructions (Signed)
[facility] Discharge Instructions for Patients Receiving Chemotherapy  Today you received the following chemotherapy agents: taxol, carboplatin, herceptin, perjeta  To help prevent nausea and vomiting after your treatment, we encourage you to take your nausea medication.  Take it as often as prescribed.     If you develop nausea and vomiting that is not controlled by your nausea medication, call the clinic. If it is after clinic hours your family physician or the after hours number for the clinic or go to the Emergency Department.   BELOW ARE SYMPTOMS THAT SHOULD BE REPORTED IMMEDIATELY:  *FEVER GREATER THAN 100.5 F  *CHILLS WITH OR WITHOUT FEVER  NAUSEA AND VOMITING THAT IS NOT CONTROLLED WITH YOUR NAUSEA MEDICATION  *UNUSUAL SHORTNESS OF BREATH  *UNUSUAL BRUISING OR BLEEDING  TENDERNESS IN MOUTH AND THROAT WITH OR WITHOUT PRESENCE OF ULCERS  *URINARY PROBLEMS  *BOWEL PROBLEMS  UNUSUAL RASH Items with * indicate a potential emergency and should be followed up as soon as possible.  Feel free to call the clinic you have any questions or concerns. The clinic phone number is [phone].   I have been informed and understand all the instructions given to me. I know to contact the clinic, my physician, or go to the Emergency Department if any problems should occur. I do not have any questions at this time, but understand that I may call the clinic during office hours   should I have any questions or need assistance in obtaining follow up care.    __________________________________________  _____________  __________ Signature of Patient or Authorized Representative            Date                   Time    __________________________________________ Nurse's Signature

## 2014-01-23 ENCOUNTER — Ambulatory Visit (HOSPITAL_BASED_OUTPATIENT_CLINIC_OR_DEPARTMENT_OTHER): Payer: Medicare Other

## 2014-01-23 VITALS — BP 145/74 | HR 75 | Temp 98.1°F

## 2014-01-23 DIAGNOSIS — C50419 Malignant neoplasm of upper-outer quadrant of unspecified female breast: Secondary | ICD-10-CM

## 2014-01-23 DIAGNOSIS — C50412 Malignant neoplasm of upper-outer quadrant of left female breast: Secondary | ICD-10-CM

## 2014-01-23 DIAGNOSIS — Z5189 Encounter for other specified aftercare: Secondary | ICD-10-CM

## 2014-01-23 MED ORDER — PEGFILGRASTIM INJECTION 6 MG/0.6ML
6.0000 mg | Freq: Once | SUBCUTANEOUS | Status: AC
  Administered 2014-01-23: 6 mg via SUBCUTANEOUS
  Filled 2014-01-23: qty 0.6

## 2014-01-24 ENCOUNTER — Ambulatory Visit (HOSPITAL_BASED_OUTPATIENT_CLINIC_OR_DEPARTMENT_OTHER)
Admission: RE | Admit: 2014-01-24 | Discharge: 2014-01-24 | Disposition: A | Discharge status: Home / Self Care | Payer: Medicare Other | Source: Ambulatory Visit | Attending: Cardiology | Admitting: Cardiology

## 2014-01-24 ENCOUNTER — Ambulatory Visit (HOSPITAL_COMMUNITY)
Admission: RE | Admit: 2014-01-24 | Discharge: 2014-01-24 | Disposition: A | Discharge status: Home / Self Care | Payer: Medicare Other | Source: Ambulatory Visit | Attending: Internal Medicine | Admitting: Internal Medicine

## 2014-01-24 VITALS — BP 132/74 | HR 91 | Wt 221.2 lb

## 2014-01-24 DIAGNOSIS — C50412 Malignant neoplasm of upper-outer quadrant of left female breast: Secondary | ICD-10-CM

## 2014-01-24 DIAGNOSIS — C50919 Malignant neoplasm of unspecified site of unspecified female breast: Secondary | ICD-10-CM | POA: Insufficient documentation

## 2014-01-24 DIAGNOSIS — C50419 Malignant neoplasm of upper-outer quadrant of unspecified female breast: Secondary | ICD-10-CM

## 2014-01-24 DIAGNOSIS — I519 Heart disease, unspecified: Secondary | ICD-10-CM

## 2014-01-24 NOTE — Patient Instructions (Signed)
Your physician has requested that you have an echocardiogram. Echocardiography is a painless test that uses sound waves to create images of your heart. It provides your doctor with information about the size and shape of your heart and how well your heart's chambers and valves are working. This procedure takes approximately one hour. There are no restrictions for this procedure.  Your physician recommends that you schedule a follow-up appointment in: 3 months with an ECHO  

## 2014-01-24 NOTE — Progress Notes (Signed)
Patient ID: April Walker, female   DOB: 1951/03/10, 63 y.o.   MRN: [id number] PCP: Dr. Levin Erp Oncologist: Dr. Jana Hakim  63 yo with recent diagnosis of breast cancer presents for cardio-oncology evaluation.  Left breast mass was found in 3/15 by mammogram, biopsy with ER+/PR+/HER2-neu amplified locally advanced left breast cancer.  Patient is to undergo 6 cycles docetaxel/carboplatin/trastuzumab/pertuzumab (has started this) then trastuzumab x 1 year.  She will have surgery after chemotherapy then radiation.   She has done well so far.  She has occasional brief "twinges" of atypical chest pain.  She has mild dyspnea walking up a hill but none on flat ground.  She participates in an aerobics class.   PMH: 1. Breast cancer: Left breast mass found 3/15 by mammogram, biopsy with ER+/PR+/HER2-neu amplified locally advanced left breast cancer.  Patient is to undergo 6 cycles docetaxel/carboplatin/trastuzumab/pertuzumab then trastuzumab x 1 year.  She will have surgery after chemotherapy then radiation.  - Echo (5/15) with EF 55-60%, GLS -18%. - Echo (7/15) with EF 60-65%, lateral s' 11.4, GLS -19.4%. 2. Hyperlipidemia 3. GERD 4. Colonic adenoma 5. OA: Left TKA 6. Chest pain: Prior stress test several years ago was normal.  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   FH: Adopted, unsure.   ROS: All systems reviewed and negative except as per HPI.   Current Outpatient Prescriptions  Medication Sig Dispense Refill  . Acetaminophen (TYLENOL EXTRA STRENGTH PO) Take by mouth.      Marland Kitchen acyclovir (ZOVIRAX) 400 MG tablet Take 1 tablet (400 mg total) by mouth 2 (two) times daily.  60 tablet  2  . cholestyramine (QUESTRAN) 4 GM/DOSE powder Take 0.5 packets (2 g total) by mouth 2 (two) times daily as needed.  378 g  12  . dexamethasone (DECADRON) 4 MG tablet 2 tabs by mouth with food twice daily on day before and 3 days after each chemo cycle  60 tablet  1  . hydrocortisone  (ANUSOL-HC) 25 MG suppository Place 1 suppository (25 mg total) rectally at bedtime as needed for hemorrhoids or itching.  10 suppository  0  . lidocaine-prilocaine (EMLA) cream Apply to port 1-2 hrs before each procedure as directed  30 g  6  . loratadine (CLARITIN) 10 MG tablet Take 10 mg by mouth daily as needed. For allergies      . LORazepam (ATIVAN) 0.5 MG tablet Take 1 tablet (0.5 mg total) by mouth at bedtime as needed for anxiety or sleep (or nausea).  30 tablet  0  . omeprazole (PRILOSEC) 20 MG capsule Take 20 mg by mouth as needed.      . ondansetron (ZOFRAN) 8 MG tablet 1 tab by mouth twice daily x 3 days after chemo, then 1 tab every 12 hrs if needed for nausea  30 tablet  1  . oxyCODONE-acetaminophen (ROXICET) 5-325 MG per tablet Take 1-2 tablets by mouth every 4 (four) hours as needed for severe pain.  30 tablet  0  . prochlorperazine (COMPAZINE) 10 MG tablet 1 tab by mouth with meals and at bedtime x 3 days after chemo, then 1 tab by mouth every 6 hrs if needed for nausea  30 tablet  3  . simvastatin (ZOCOR) 10 MG tablet Take 10 mg by mouth daily. Pt TAKES AT BEDTIME       No current facility-administered medications for this encounter.    BP 132/74  Pulse 91  Wt 221 lb 4 oz (100.358 kg)  SpO2  96% General: NAD Neck: No JVD, no thyromegaly or thyroid nodule.  Lungs: Clear to auscultation bilaterally with normal respiratory effort. CV: Nondisplaced PMI.  Heart regular S1/S2, no S3/S4, no murmur.  No peripheral edema.  No carotid bruit.  Normal pedal pulses.  Abdomen: Soft, nontender, no hepatosplenomegaly, no distention.  Skin: Intact without lesions or rashes.  Neurologic: Alert and oriented x 3.  Psych: Normal affect. Extremities: No clubbing or cyanosis.  HEENT: Normal.   Assessment/Plan: 63 yo with left breast cancer now undergoing chemotherapy including trastuzumab.  She will then get trastuzumab every 3 weeks for a year.  I discussed the cardiac risk from trastuzumab  with the patient (approximately 10% risk for usually reversible cardiac dysfunction) and the rationale behind echo screening.  I reviewed her last two echoes which show normal EF and stable strain pattern.  I will have her return in 3 months for echo and followup.   Loralie Champagne 01/24/2014

## 2014-01-24 NOTE — Progress Notes (Signed)
Echo Lab  2D Echocardiogram completed.  Emilie Carp L Cesilia Shinn, RDCS 01/24/2014 8:21 AM

## 2014-01-26 ENCOUNTER — Ambulatory Visit (INDEPENDENT_AMBULATORY_CARE_PROVIDER_SITE_OTHER): Payer: Commercial Managed Care - PPO | Admitting: General Surgery

## 2014-01-26 ENCOUNTER — Encounter (INDEPENDENT_AMBULATORY_CARE_PROVIDER_SITE_OTHER): Payer: Self-pay | Admitting: General Surgery

## 2014-01-26 VITALS — BP 140/80 | HR 76 | Resp 18 | Ht 68.5 in | Wt 219.0 lb

## 2014-01-26 DIAGNOSIS — C50412 Malignant neoplasm of upper-outer quadrant of left female breast: Secondary | ICD-10-CM

## 2014-01-26 DIAGNOSIS — C50419 Malignant neoplasm of upper-outer quadrant of unspecified female breast: Secondary | ICD-10-CM

## 2014-01-26 NOTE — Patient Instructions (Signed)
We will set up surgery for left seed localized lumpectomy and sentinel lymph node biopsy in mid September.    I will see you back in the beginning of September.    IF YOU ARE TAKING ASPIRIN, COUMADIN/WARFARIN, PLAVIX, OR OTHER BLOOD THINNER, PLEASE LET us KNOW IMMEDIATELY.  WE WILL NEED TO DISCUSS WITH THE PRESCRIBING PROVIDER IF THESE ARE SAFE TO STOP. IF THESE ARE NOT STOPPED AT THE APPROPRIATE TIME, THIS WILL RESULT IN A DELAY FOR YOUR SURGERY.  DO NOT TAKE THESE MEDICATIONS OR IBUPROFEN/NAPROXEN WITHIN A WEEK BEFORE SURGERY.   The main risks of surgery are bleeding, infection, damage to other structures, and seroma (accumulation of fluid) under the incision site(s).    These complications may lead to additional procedures such as drainage of seroma/infection.  If cancer is found, you may need other surgeries to obtain negative margins or to take more lymph nodes.   Most women do accumulate fluid in the breast cavity where the specimen was removed. We do not always have to drain this fluid.  If your breast is very tense, painful, or red, then we may need to numb the skin and use a needle to aspirate the fluid.  We do provide patients with a Breast Binder.  The purpose of this is to avoid the use of tape on the sensitive tissue of the breast and to provide some compression to minimize the risk of seroma.  If the binder is uncomfortable, you may find that a tank top with a built-in shelf bra or a loose sports bra works better for you.  I recommend wearing this around the clock for the first 1-2 weeks except in the shower.    You may remove your dressings and may shower 48 hours after surgery.  Many patients have some constipation in the week after surgery due to the narcotics and anesthesia.  You may need over the counter stool softeners or laxatives if you experience difficulty having bowel movements.    If the following occur, call our office at [phone]: If you have a fever over 101  or pain that is severe despite narcotics. If you have redness or drainage at the wound. If you develop persistent nausea or vomiting.  I will follow you back up in 1-4 weeks.    Please submit any paperwork about time off work/insurance forms to the front desk.

## 2014-01-26 NOTE — Assessment & Plan Note (Signed)
Pt to finish chemo 03/05/2014.  We will set up surgery around 2 weeks afterward.    I will see her between when she finished chemo and surgery.  We will plan left seed localized lumpectomy and SLN bx.    The surgical procedure was described to the patient.  I discussed the incision type and location and that we would need radiology for a seed marker and/or sentinel node.      The risks and benefits of the procedure were described to the patient and she wishes to proceed.    We discussed the risks bleeding, infection, damage to other structures, need for further procedures/surgeries.  We discussed the risk of seroma.  The patient was advised that we may need to go back to surgery for additional tissue to obtain negative margins or for additional lymph nodes. The patient was advised that these are the most common complications, but that others can occur as well.  They were advised against taking aspirin or other anti-inflammatory agents/blood thinners the week before surgery.

## 2014-01-26 NOTE — Progress Notes (Signed)
HISTORY: Pt is a 63 yo F who is undergoing neoadjuvant chemotherapy for left T2N0 breast cancer.  She has done well with chemo overall.  She has had some nausea and some diarrhea, but these are controlled with medication.  She has not ever been able to palpate the tumor.  She has had alopecia.    PERTINENT REVIEW OF SYSTEMS: Otherwise negative x 11.    Filed Vitals:   01/26/14 1230  BP: 140/80  Pulse: 76  Resp: 18   Wt Readings from Last 3 Encounters:  01/26/14 219 lb (99.338 kg)  01/24/14 221 lb 4 oz (100.358 kg)  01/22/14 221 lb 1.6 oz (100.29 kg)    EXAM: Head: Normocephalic and atraumatic.  Eyes:  Conjunctivae are normal. Pupils are equal, round, and reactive to light. No scleral icterus.  Neck:  Normal range of motion. Neck supple. No tracheal deviation present. No thyromegaly present.  Resp: No respiratory distress, normal effort. Breast:  No palpable mass or lymphadenopathy on left.   Abd:  Abdomen is soft, non distended and non tender. No masses are palpable.  There is no rebound and no guarding.  Neurological: Alert and oriented to person, place, and time. Coordination normal.  Skin: Skin is warm and dry. No rash noted. No diaphoretic. No erythema. No pallor.  Psychiatric: Normal mood and affect. Normal behavior. Judgment and thought content normal.   Breast MRI 01/09/2014 IMPRESSION:  No residual enhancement identified within the known left breast  carcinoma.   ASSESSMENT AND PLAN:   Breast cancer of upper-outer quadrant of left female breast Pt to finish chemo 03/05/2014.  We will set up surgery around 2 weeks afterward.    I will see her between when she finished chemo and surgery.  We will plan left seed localized lumpectomy and SLN bx.    The surgical procedure was described to the patient.  I discussed the incision type and location and that we would need radiology for a seed marker and/or sentinel node.      The risks and benefits of the procedure were  described to the patient and she wishes to proceed.    We discussed the risks bleeding, infection, damage to other structures, need for further procedures/surgeries.  We discussed the risk of seroma.  The patient was advised that we may need to go back to surgery for additional tissue to obtain negative margins or for additional lymph nodes. The patient was advised that these are the most common complications, but that others can occur as well.  They were advised against taking aspirin or other anti-inflammatory agents/blood thinners the week before surgery.         Milus Height, MD Surgical Oncology, [facility]  GREEN, Keenan Bachelor, MD No ref. provider found

## 2014-01-29 ENCOUNTER — Telehealth: Payer: Self-pay | Admitting: Oncology

## 2014-01-29 ENCOUNTER — Ambulatory Visit (HOSPITAL_BASED_OUTPATIENT_CLINIC_OR_DEPARTMENT_OTHER): Payer: Medicare Other | Admitting: Oncology

## 2014-01-29 ENCOUNTER — Telehealth: Payer: Self-pay | Admitting: *Deleted

## 2014-01-29 ENCOUNTER — Other Ambulatory Visit (HOSPITAL_BASED_OUTPATIENT_CLINIC_OR_DEPARTMENT_OTHER): Payer: Medicare Other

## 2014-01-29 VITALS — BP 138/81 | HR 93 | Temp 98.2°F | Resp 18 | Ht 68.5 in | Wt 215.8 lb

## 2014-01-29 DIAGNOSIS — R209 Unspecified disturbances of skin sensation: Secondary | ICD-10-CM

## 2014-01-29 DIAGNOSIS — R197 Diarrhea, unspecified: Secondary | ICD-10-CM

## 2014-01-29 DIAGNOSIS — C50412 Malignant neoplasm of upper-outer quadrant of left female breast: Secondary | ICD-10-CM

## 2014-01-29 DIAGNOSIS — C50419 Malignant neoplasm of upper-outer quadrant of unspecified female breast: Secondary | ICD-10-CM

## 2014-01-29 DIAGNOSIS — Z170 Estrogen receptor positive status [ER+]: Secondary | ICD-10-CM

## 2014-01-29 LAB — CBC WITH DIFFERENTIAL/PLATELET
BASO%: 1.2 % (ref 0.0–2.0)
BASOS ABS: 0.1 10*3/uL (ref 0.0–0.1)
EOS%: 0.1 % (ref 0.0–7.0)
Eosinophils Absolute: 0 10*3/uL (ref 0.0–0.5)
HEMATOCRIT: 34.7 % — AB (ref 34.8–46.6)
HEMOGLOBIN: 11.5 g/dL — AB (ref 11.6–15.9)
LYMPH#: 2.1 10*3/uL (ref 0.9–3.3)
LYMPH%: 29.1 % (ref 14.0–49.7)
MCH: 28.2 pg (ref 25.1–34.0)
MCHC: 33.1 g/dL (ref 31.5–36.0)
MCV: 85.1 fL (ref 79.5–101.0)
MONO#: 0.8 10*3/uL (ref 0.1–0.9)
MONO%: 11.5 % (ref 0.0–14.0)
NEUT#: 4.1 10*3/uL (ref 1.5–6.5)
NEUT%: 58.1 % (ref 38.4–76.8)
Platelets: 203 10*3/uL (ref 145–400)
RBC: 4.07 10*6/uL (ref 3.70–5.45)
RDW: 19.5 % — ABNORMAL HIGH (ref 11.2–14.5)
WBC: 7.1 10*3/uL (ref 3.9–10.3)

## 2014-01-29 LAB — COMPREHENSIVE METABOLIC PANEL (CC13)
ALT: 21 U/L (ref 0–55)
ANION GAP: 9 meq/L (ref 3–11)
AST: 17 U/L (ref 5–34)
Albumin: 3.8 g/dL (ref 3.5–5.0)
Alkaline Phosphatase: 112 U/L (ref 40–150)
BILIRUBIN TOTAL: 0.6 mg/dL (ref 0.20–1.20)
BUN: 11.3 mg/dL (ref 7.0–26.0)
CALCIUM: 9.6 mg/dL (ref 8.4–10.4)
CHLORIDE: 107 meq/L (ref 98–109)
CO2: 28 mEq/L (ref 22–29)
CREATININE: 0.7 mg/dL (ref 0.6–1.1)
GLUCOSE: 128 mg/dL (ref 70–140)
Potassium: 3.3 mEq/L — ABNORMAL LOW (ref 3.5–5.1)
Sodium: 144 mEq/L (ref 136–145)
Total Protein: 6.6 g/dL (ref 6.4–8.3)

## 2014-01-29 NOTE — Telephone Encounter (Signed)
per pof to sch pt appt-cld [facility] and she sch trmt-gave pt copy of sch-pt stated has MY CHART and will continue to review as a reminder

## 2014-01-29 NOTE — Telephone Encounter (Signed)
Per [facility] staff phone call scheduled appts. Advised schedulers 

## 2014-01-29 NOTE — Progress Notes (Signed)
[place]  [phone] [phone]) [phone]     ID: April Walker OB: Jul 26, 1965  MR#: [id number]  VEH#:[id number]  PCP: Criselda Peaches, MD GYN:  Vania Rea SU: Stark Klein  OTHER MD: [name], Silvano Rusk, Arther Abbott  CHIEF COMPLAINT: Left Breast Cancer, locally advanced TREATMENT: neoadjuvant chemo-immunotherap   BREAST CANCER HISTORY: From the original integument:  [name] had routine screening mammography at [facility] 09/14/2013. This showed a possible mass in the left breast. On 10/11/2013 she underwent left diagnostic mammography and ultrasonography which confirmed an irregular mass in the upper outer left breast measuring up to 3 cm. This was firm and fixed at the 2:30 o'clock position, 6 cm from the nipple. Ultrasound showed an irregular hypoechoic mass measuring 2.8 cm. The left axilla showed no evidence of adenopathy.  On 10/11/2013 the patient underwent left breast biopsy, with the pathology [facility] [phone]) showing an invasive ductal carcinoma, high-grade, estrogen receptor 100% positive, progesterone receptor 43% positive, both with strong staining intensity, with an MIB-1 of 87% and HER-2 amplified, the signals ratio being 2.72, the number per cell 3.40.  On 10/23/2013 the patient underwent bilateral breast MRI. This showed the breast and position to be density be. In the left breast there was a 3 cm irregular enhancing mass in the upper outer quadrant. There was a 9 mm oval mass in the inner lower left breast with no definite fatty hilum. There was a 1.4 cm upper left axillary lymph node with a thickened cortex. There were no other abnormal appearing lymph nodes. Ultrasound of the left breast 10/30/2013 found of the left axillary lymph node with the mildly asymmetrical cortex, and a 7 mm her mass in the 7:30 o'clock position of the left breast. Biopsy of both these suspicious areas was performed 10/30/2013, and the preliminary  report is that they're both benign.  The patient's subsequent history is as detailed below  INTERVAL HISTORY: April Walker returns today for followup of her locally advanced left breast carcinoma. She continues to receive neoadjuvant therapy, with today being day 8 cycle 4 of 6 planned q. three-week doses of docetaxel/ carboplatin given with trastuzumab and pertuzumab.  She receives Neulasta on day 2  of each cycle.  Since her last visit here [name] had a midcourse MRI, which shows a complete radiologic response. We were both very placed with these results. She also had a repeat echocardiogram earlier this month which shows a well preserved ejection fraction.   REVIEW OF SYSTEMS: She is tolerating chemotherapy remarkably well. She does regular exercise as this as walking her dog every day. She does get a slight diarrhea from the pertuzumab. She is taking Questran daily which is helping. Of more concern, she has begun to develop numbness over her fingertips. Not her toe tips. This is persistent. There is no tingling or discomfort. A detailed review of systems today was otherwise noncontributory  PAST MEDICAL HISTORY: Past Medical History  Diagnosis Date  . Abnormal cholesterol test   . Hyperlipidemia   . Knee pain, left   . GERD (gastroesophageal reflux disease)   . Arthritis   . Tubulovillous adenoma of colon   . Diverticulosis   . Internal hemorrhoids   . Wears glasses   . Hearing deficit     no hearing aids    PAST SURGICAL HISTORY: Past Surgical History  Procedure Laterality Date  . Tonsillectomy and adenoidectomy    . Right knee arthroscopy  2011 Dr. Aline Brochure  . Treatment  fistula anal    . Upper gastrointestinal endoscopy    . Hemorrhoid surgery    . Colonoscopy  2002    hemorrhoids  . Colonoscopy w/ polypectomy  12/09/2010    1 cm sigmoid polyp - , diverticulosis, hemorrhoids  . Knee surgery    . Knee surgery  2012    Left knee arthroscopy  . Total knee arthroplasty  11/23/2011     Procedure: TOTAL KNEE ARTHROPLASTY;  Surgeon: Carole Civil, MD;  Location: [facility];  Service: Orthopedics;  Laterality: Left;  . Portacath placement Left 11/17/2013    Procedure: INSERTION PORT-A-CATH;  Surgeon: Stark Klein, MD;  Location: [facility];  Service: General;  Laterality: Left;    FAMILY HISTORY Family History  Problem Relation Age of Onset  . Adopted: Yes  . Stroke Mother   . Heart attack Father    the patient is adopted and has no information on her biological family. the patient's adoptive father died at the age of 42 from a myocardial infarction; productive mother died from a stroke at age 71. The patient does not know of any siblings  GYNECOLOGIC HISTORY: (Reviewed 12/18/2013) Menarche age 63. The patient is GX P0. She underwent menopause in her early 47s and took hormone replacement until 2010.  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 She is single, and lives at home with her black lab, [name], and her cats Patches and [name]    ADVANCED DIRECTIVES: Not in place   HEALTH MAINTENANCE:  (Reviewed 12/18/2013) History  Substance Use Topics  . Smoking status: Never Smoker   . Smokeless tobacco: Never Used  . Alcohol Use: Yes     Comment: socially     Colonoscopy: 2012/[name]  PAP: 2014/[name]  Bone density: not on file  Lipid panel: not on file   Allergies  Allergen Reactions  . Sulfonamide Derivatives Anaphylaxis and Hives    Pt. Stated, "large welps on neck immediately."  . Triprolidine-Pse Other (See Comments)    This is Actifed. Increased heart rate to tachycardia.    Current Outpatient Prescriptions  Medication Sig Dispense Refill  . Acetaminophen (TYLENOL EXTRA STRENGTH PO) Take by mouth.      Marland Kitchen acyclovir (ZOVIRAX) 400 MG tablet Take 1 tablet (400 mg total) by mouth 2 (two) times daily.  60 tablet  2  . cholestyramine (QUESTRAN) 4 GM/DOSE  powder Take 0.5 packets (2 g total) by mouth 2 (two) times daily as needed.  378 g  12  . dexamethasone (DECADRON) 4 MG tablet 2 tabs by mouth with food twice daily on day before and 3 days after each chemo cycle  60 tablet  1  . hydrocortisone (ANUSOL-HC) 25 MG suppository Place 1 suppository (25 mg total) rectally at bedtime as needed for hemorrhoids or itching.  10 suppository  0  . lidocaine-prilocaine (EMLA) cream Apply to port 1-2 hrs before each procedure as directed  30 g  6  . loratadine (CLARITIN) 10 MG tablet Take 10 mg by mouth daily as needed. For allergies      . LORazepam (ATIVAN) 0.5 MG tablet Take 1 tablet (0.5 mg total) by mouth at bedtime as needed for anxiety or sleep (or nausea).  30 tablet  0  . omeprazole (PRILOSEC) 20 MG capsule Take 20 mg by mouth as needed.      . ondansetron (ZOFRAN) 8 MG tablet 1 tab by  mouth twice daily x 3 days after chemo, then 1 tab every 12 hrs if needed for nausea  30 tablet  1  . oxyCODONE-acetaminophen (ROXICET) 5-325 MG per tablet Take 1-2 tablets by mouth every 4 (four) hours as needed for severe pain.  30 tablet  0  . prochlorperazine (COMPAZINE) 10 MG tablet 1 tab by mouth with meals and at bedtime x 3 days after chemo, then 1 tab by mouth every 6 hrs if needed for nausea  30 tablet  3  . simvastatin (ZOCOR) 10 MG tablet Take 10 mg by mouth daily. Pt TAKES AT BEDTIME       No current facility-administered medications for this visit.    OBJECTIVE: Middle-aged white woman in no acute distress Filed Vitals:   01/29/14 1041  BP: 138/81  Pulse: 93  Temp: 98.2 F (36.8 C)  Resp: 18     Body mass index is 32.33 kg/(m^2).    ECOG FS:1 - Symptomatic but completely ambulatory Filed Weights   01/29/14 1041  Weight: 215 lb 12.8 oz (97.886 kg)   Sclerae unicteric, pupils equal and reactive Oropharynx clear and moist-- no thrush No cervical or supraclavicular adenopathy Lungs no rales or rhonchi Heart regular rate and rhythm Abd soft,  nontender, positive bowel sounds MSK no focal spinal tenderness, no upper extremity lymphedema Neuro: nonfocal, well oriented, appropriate affect Breasts: The right breast is unremarkable. I do not palpate a mass in the left breast. There are no skin or nipple changes of concern. The left axilla is benign.  LAB RESULTS:   Lab Results  Component Value Date   WBC 7.1 01/29/2014   NEUTROABS 4.1 01/29/2014   HGB 11.5* 01/29/2014   HCT 34.7* 01/29/2014   MCV 85.1 01/29/2014   PLT 203 01/29/2014      Chemistry      Component Value Date/Time   NA 144 01/29/2014 0934   NA 135 11/24/2011 0513   K 3.3* 01/29/2014 0934   K 3.9 11/24/2011 0513   CL 102 11/24/2011 0513   CO2 28 01/29/2014 0934   CO2 25 11/24/2011 0513   BUN 11.3 01/29/2014 0934   BUN 11 11/24/2011 0513   CREATININE 0.7 01/29/2014 0934   CREATININE 0.54 11/24/2011 0513      Component Value Date/Time   CALCIUM 9.6 01/29/2014 0934   CALCIUM 9.0 11/24/2011 0513   ALKPHOS 112 01/29/2014 0934   AST 17 01/29/2014 0934   ALT 21 01/29/2014 0934   BILITOT 0.60 01/29/2014 0934       STUDIES: Mr Breast Bilateral W Wo Contrast  01/09/2014   CLINICAL DATA:  Patient with history of left breast carcinoma. Evaluate response to neoadjuvant chemotherapy.  EXAM: BILATERAL BREAST MRI WITH AND WITHOUT CONTRAST  TECHNIQUE: Multiplanar, multisequence MR images of both breasts were obtained prior to and following the intravenous administration of 2m of MultiHance.  THREE-DIMENSIONAL MR IMAGE RENDERING ON INDEPENDENT WORKSTATION:  Three-dimensional MR images were rendered by post-processing of the original MR data on an independent workstation. The three-dimensional MR images were interpreted, and findings are reported in the following complete MRI report for this study. Three dimensional images were evaluated at the independent DynaCad workstation  COMPARISON:  Previous exams  FINDINGS: Breast composition: b.  Scattered fibroglandular tissue.  Background  parenchymal enhancement: Mild  Right breast: No mass or abnormal enhancement.  Left breast: Previously described mass within the upper-outer left breast middle depth demonstrates no residual contrast enhancement. Residual nonenhancing tissue measures 1.8 x 1.3  cm, previously 3.1 x 2.9 cm at the same level.  Susceptibility artifact is demonstrated within the lower inner left breast anterior depth at the site of previously biopsied enhancing mass. This was biopsied and represented benign pathology.  No concerning areas of enhancement are identified within the left breast.  Lymph nodes: No abnormal appearing lymph nodes.  Ancillary findings:  None.  IMPRESSION: No residual enhancement identified within the known left breast carcinoma.  RECOMMENDATION: Treatment plan  BI-RADS CATEGORY  6: Known biopsy-proven malignancy.   Electronically Signed   By: Lovey Newcomer M.D.   On: 01/09/2014 12:32    ASSESSMENT: 63 y.o. [place] woman status post left breast biopsy 10/11/2013 for a clinical T2 N0, stage IIA invasive ductal carcinoma, grade 3, estrogen and progesterone receptor positive, with an MIB-1 of 87%, and HER-2 amplified  (a) additional Left breast and left axillary node biopsy 10/30/2013 benign  (1) started neoadjuvant therapy 11/20/2013 , consisting of carboplatin, docetaxel, trastuzumab and pertuzumab every 3 weeks x6 with Neulasta day 2  (2) trastuzumab alone to continue to complete a year; most recent echocardiogram 01/24/2014 shows a well preserved ejection fraction.  (3) surgery to follow chemotherapy  (4) radiation to follow surgery  (5) anti-estrogens to follow radiation  PLAN:  Huda is tolerating chemotherapy well and she is having a wonderful response. Am concerned regarding her early neuropathy symptoms. It is clearer or at least do not progress by the time of her fifth cycle, we will proceed with docetaxel and carboplatin. If the symptoms worsen however we will switch to carboplatin and  gemcitabine. Either way I am confident she will obtain a complete pathologic response.  She knows to call for any problems that may develop before her next visit here.    Chauncey Cruel, MD  01/29/2014 10:54 AM

## 2014-01-30 ENCOUNTER — Other Ambulatory Visit: Payer: Self-pay | Admitting: Obstetrics & Gynecology

## 2014-01-31 LAB — CYTOLOGY - PAP

## 2014-02-10 ENCOUNTER — Other Ambulatory Visit (INDEPENDENT_AMBULATORY_CARE_PROVIDER_SITE_OTHER): Payer: Self-pay | Admitting: General Surgery

## 2014-02-10 DIAGNOSIS — C50912 Malignant neoplasm of unspecified site of left female breast: Secondary | ICD-10-CM

## 2014-02-12 ENCOUNTER — Ambulatory Visit (HOSPITAL_BASED_OUTPATIENT_CLINIC_OR_DEPARTMENT_OTHER): Payer: Medicare Other

## 2014-02-12 ENCOUNTER — Other Ambulatory Visit: Payer: Medicare Other

## 2014-02-12 ENCOUNTER — Encounter: Payer: Self-pay | Admitting: Adult Health

## 2014-02-12 ENCOUNTER — Ambulatory Visit: Payer: Medicare Other

## 2014-02-12 ENCOUNTER — Other Ambulatory Visit: Payer: Self-pay | Admitting: Hematology

## 2014-02-12 ENCOUNTER — Other Ambulatory Visit: Payer: 59

## 2014-02-12 ENCOUNTER — Ambulatory Visit: Payer: 59 | Admitting: Adult Health

## 2014-02-12 ENCOUNTER — Ambulatory Visit (HOSPITAL_BASED_OUTPATIENT_CLINIC_OR_DEPARTMENT_OTHER): Payer: Medicare Other | Admitting: Adult Health

## 2014-02-12 ENCOUNTER — Other Ambulatory Visit (HOSPITAL_BASED_OUTPATIENT_CLINIC_OR_DEPARTMENT_OTHER): Payer: Medicare Other

## 2014-02-12 VITALS — BP 140/59 | HR 90 | Temp 98.3°F | Resp 18 | Ht 68.5 in | Wt 222.6 lb

## 2014-02-12 DIAGNOSIS — C50412 Malignant neoplasm of upper-outer quadrant of left female breast: Secondary | ICD-10-CM

## 2014-02-12 DIAGNOSIS — Z5111 Encounter for antineoplastic chemotherapy: Secondary | ICD-10-CM

## 2014-02-12 DIAGNOSIS — C50419 Malignant neoplasm of upper-outer quadrant of unspecified female breast: Secondary | ICD-10-CM

## 2014-02-12 DIAGNOSIS — Z170 Estrogen receptor positive status [ER+]: Secondary | ICD-10-CM

## 2014-02-12 DIAGNOSIS — R197 Diarrhea, unspecified: Secondary | ICD-10-CM

## 2014-02-12 DIAGNOSIS — Z5112 Encounter for antineoplastic immunotherapy: Secondary | ICD-10-CM

## 2014-02-12 LAB — CBC WITH DIFFERENTIAL/PLATELET
BASO%: 0.3 % (ref 0.0–2.0)
BASOS ABS: 0 10*3/uL (ref 0.0–0.1)
EOS ABS: 0 10*3/uL (ref 0.0–0.5)
EOS%: 0 % (ref 0.0–7.0)
HEMATOCRIT: 32.9 % — AB (ref 34.8–46.6)
HEMOGLOBIN: 10.8 g/dL — AB (ref 11.6–15.9)
LYMPH%: 12.3 % — AB (ref 14.0–49.7)
MCH: 28.8 pg (ref 25.1–34.0)
MCHC: 33 g/dL (ref 31.5–36.0)
MCV: 87.4 fL (ref 79.5–101.0)
MONO#: 0.5 10*3/uL (ref 0.1–0.9)
MONO%: 5.7 % (ref 0.0–14.0)
NEUT%: 81.7 % — AB (ref 38.4–76.8)
NEUTROS ABS: 6.7 10*3/uL — AB (ref 1.5–6.5)
Platelets: 290 10*3/uL (ref 145–400)
RBC: 3.76 10*6/uL (ref 3.70–5.45)
RDW: 19.7 % — ABNORMAL HIGH (ref 11.2–14.5)
WBC: 8.2 10*3/uL (ref 3.9–10.3)
lymph#: 1 10*3/uL (ref 0.9–3.3)

## 2014-02-12 LAB — COMPREHENSIVE METABOLIC PANEL
ALT: 16 U/L (ref 0–35)
AST: 16 U/L (ref 0–37)
Albumin: 3.9 g/dL (ref 3.5–5.2)
Alkaline Phosphatase: 92 U/L (ref 39–117)
BILIRUBIN TOTAL: 0.4 mg/dL (ref 0.2–1.2)
BUN: 15 mg/dL (ref 6–23)
CO2: 25 meq/L (ref 19–32)
Calcium: 10 mg/dL (ref 8.4–10.5)
Chloride: 106 mEq/L (ref 96–112)
Creatinine, Ser: 0.68 mg/dL (ref 0.50–1.10)
GLUCOSE: 188 mg/dL — AB (ref 70–99)
Potassium: 3.8 mEq/L (ref 3.5–5.3)
SODIUM: 146 meq/L — AB (ref 135–145)
TOTAL PROTEIN: 7 g/dL (ref 6.0–8.3)

## 2014-02-12 MED ORDER — ONDANSETRON 16 MG/50ML IVPB (CHCC)
16.0000 mg | Freq: Once | INTRAVENOUS | Status: AC
  Administered 2014-02-12: 16 mg via INTRAVENOUS

## 2014-02-12 MED ORDER — DIPHENHYDRAMINE HCL 25 MG PO CAPS
ORAL_CAPSULE | ORAL | Status: AC
  Filled 2014-02-12: qty 1

## 2014-02-12 MED ORDER — PERTUZUMAB CHEMO INJECTION 420 MG/14ML
420.0000 mg | Freq: Once | INTRAVENOUS | Status: AC
  Administered 2014-02-12: 420 mg via INTRAVENOUS
  Filled 2014-02-12: qty 14

## 2014-02-12 MED ORDER — SODIUM CHLORIDE 0.9 % IV SOLN
564.0000 mg | Freq: Once | INTRAVENOUS | Status: AC
  Administered 2014-02-12: 560 mg via INTRAVENOUS
  Filled 2014-02-12: qty 56

## 2014-02-12 MED ORDER — HEPARIN SOD (PORK) LOCK FLUSH 100 UNIT/ML IV SOLN
500.0000 [IU] | Freq: Once | INTRAVENOUS | Status: AC | PRN
  Administered 2014-02-12: 500 [IU]
  Filled 2014-02-12: qty 5

## 2014-02-12 MED ORDER — SODIUM CHLORIDE 0.9 % IJ SOLN
10.0000 mL | INTRAMUSCULAR | Status: DC | PRN
  Administered 2014-02-12: 10 mL
  Filled 2014-02-12: qty 10

## 2014-02-12 MED ORDER — DOCETAXEL CHEMO INJECTION 160 MG/16ML
75.0000 mg/m2 | Freq: Once | INTRAVENOUS | Status: AC
  Administered 2014-02-12: 160 mg via INTRAVENOUS
  Filled 2014-02-12: qty 16

## 2014-02-12 MED ORDER — TRASTUZUMAB CHEMO INJECTION 440 MG
6.0000 mg/kg | Freq: Once | INTRAVENOUS | Status: AC
  Administered 2014-02-12: 588 mg via INTRAVENOUS
  Filled 2014-02-12: qty 28

## 2014-02-12 MED ORDER — ACETAMINOPHEN 325 MG PO TABS
650.0000 mg | ORAL_TABLET | Freq: Once | ORAL | Status: AC
  Administered 2014-02-12: 650 mg via ORAL

## 2014-02-12 MED ORDER — DIPHENHYDRAMINE HCL 25 MG PO CAPS
25.0000 mg | ORAL_CAPSULE | Freq: Once | ORAL | Status: AC
  Administered 2014-02-12: 25 mg via ORAL

## 2014-02-12 MED ORDER — DEXAMETHASONE SODIUM PHOSPHATE 20 MG/5ML IJ SOLN
INTRAMUSCULAR | Status: AC
  Filled 2014-02-12: qty 5

## 2014-02-12 MED ORDER — SODIUM CHLORIDE 0.9 % IV SOLN
Freq: Once | INTRAVENOUS | Status: AC
  Administered 2014-02-12: 11:00:00 via INTRAVENOUS

## 2014-02-12 MED ORDER — DEXAMETHASONE SODIUM PHOSPHATE 20 MG/5ML IJ SOLN
20.0000 mg | Freq: Once | INTRAMUSCULAR | Status: AC
  Administered 2014-02-12: 20 mg via INTRAVENOUS

## 2014-02-12 MED ORDER — ONDANSETRON 16 MG/50ML IVPB (CHCC)
INTRAVENOUS | Status: AC
  Filled 2014-02-12: qty 16

## 2014-02-12 MED ORDER — ACETAMINOPHEN 325 MG PO TABS
ORAL_TABLET | ORAL | Status: AC
  Filled 2014-02-12: qty 2

## 2014-02-12 NOTE — Patient Instructions (Signed)
Take Super B complex daily.

## 2014-02-12 NOTE — Patient Instructions (Addendum)
April Walker Discharge Instructions for Patients Receiving Chemotherapy  Today you received the following chemotherapy agents: Taxotere, Carboplatin, Herceptin, Perjeta  To help prevent nausea and vomiting after your treatment, we encourage you to take your nausea medication: Zofran 8 mg one every 12 hours or Compazine 10 mg every 6 hours as needed for nausea.  BELOW ARE SYMPTOMS THAT SHOULD BE REPORTED IMMEDIATELY:  *FEVER GREATER THAN 100.5 F  *CHILLS WITH OR WITHOUT FEVER  NAUSEA AND VOMITING THAT IS NOT CONTROLLED WITH YOUR NAUSEA MEDICATION  *UNUSUAL SHORTNESS OF BREATH  *UNUSUAL BRUISING OR BLEEDING  TENDERNESS IN MOUTH AND THROAT WITH OR WITHOUT PRESENCE OF ULCERS  *URINARY PROBLEMS  *BOWEL PROBLEMS  UNUSUAL RASH Items with * indicate a potential emergency and should be followed up as soon as possible.  Feel free to call the clinic you have any questions or concerns. The clinic phone number is [phone].   Carboplatin injection What is this medicine? CARBOPLATIN (KAR boe pla tin) is a chemotherapy drug. It targets fast dividing cells, like cancer cells, and causes these cells to die. This medicine is used to treat ovarian cancer and many other cancers. This medicine may be used for other purposes; ask your health care provider or pharmacist if you have questions. COMMON BRAND NAME(S): Paraplatin What should I tell my health care provider before I take this medicine? They need to know if you have any of these conditions: -blood disorders -hearing problems -kidney disease -recent or ongoing radiation therapy -an unusual or allergic reaction to carboplatin, cisplatin, other chemotherapy, other medicines, foods, dyes, or preservatives -pregnant or trying to get pregnant -breast-feeding How should I use this medicine? This drug is usually given as an infusion into a vein. It is administered in a hospital or clinic by a specially trained health care  professional. Talk to your pediatrician regarding the use of this medicine in children. Special care may be needed. Overdosage: If you think you have taken too much of this medicine contact a poison control center or emergency room at once. NOTE: This medicine is only for you. Do not share this medicine with others. What if I miss a dose? It is important not to miss a dose. Call your doctor or health care professional if you are unable to keep an appointment. What may interact with this medicine? -medicines for seizures -medicines to increase blood counts like filgrastim, pegfilgrastim, sargramostim -some antibiotics like amikacin, gentamicin, neomycin, streptomycin, tobramycin -vaccines Talk to your doctor or health care professional before taking any of these medicines: -acetaminophen -aspirin -ibuprofen -ketoprofen -naproxen This list may not describe all possible interactions. Give your health care provider a list of all the medicines, herbs, non-prescription drugs, or dietary supplements you use. Also tell them if you smoke, drink alcohol, or use illegal drugs. Some items may interact with your medicine. What should I watch for while using this medicine? Your condition will be monitored carefully while you are receiving this medicine. You will need important blood work done while you are taking this medicine. This drug may make you feel generally unwell. This is not uncommon, as chemotherapy can affect healthy cells as well as cancer cells. Report any side effects. Continue your course of treatment even though you feel ill unless your doctor tells you to stop. In some cases, you may be given additional medicines to help with side effects. Follow all directions for their use. Call your doctor or health care professional for advice if you get a  fever, chills or sore throat, or other symptoms of a cold or flu. Do not treat yourself. This drug decreases your body's ability to fight infections.  Try to avoid being around people who are sick. This medicine may increase your risk to bruise or bleed. Call your doctor or health care professional if you notice any unusual bleeding. Be careful brushing and flossing your teeth or using a toothpick because you may get an infection or bleed more easily. If you have any dental work done, tell your dentist you are receiving this medicine. Avoid taking products that contain aspirin, acetaminophen, ibuprofen, naproxen, or ketoprofen unless instructed by your doctor. These medicines may hide a fever. Do not become pregnant while taking this medicine. Women should inform their doctor if they wish to become pregnant or think they might be pregnant. There is a potential for serious side effects to an unborn child. Talk to your health care professional or pharmacist for more information. Do not breast-feed an infant while taking this medicine. What side effects may I notice from receiving this medicine? Side effects that you should report to your doctor or health care professional as soon as possible: -allergic reactions like skin rash, itching or hives, swelling of the face, lips, or tongue -signs of infection - fever or chills, cough, sore throat, pain or difficulty passing urine -signs of decreased platelets or bleeding - bruising, pinpoint red spots on the skin, black, tarry stools, nosebleeds -signs of decreased red blood cells - unusually weak or tired, fainting spells, lightheadedness -breathing problems -changes in hearing -changes in vision -chest pain -high blood pressure -low blood counts - This drug may decrease the number of white blood cells, red blood cells and platelets. You may be at increased risk for infections and bleeding. -nausea and vomiting -pain, swelling, redness or irritation at the injection site -pain, tingling, numbness in the hands or feet -problems with balance, talking, walking -trouble passing urine or change in the  amount of urine Side effects that usually do not require medical attention (report to your doctor or health care professional if they continue or are bothersome): -hair loss -loss of appetite -metallic taste in the mouth or changes in taste This list may not describe all possible side effects. Call your doctor for medical advice about side effects. You may report side effects to FDA at 1-800-FDA-1088. Where should I keep my medicine? This drug is given in a hospital or clinic and will not be stored at home. NOTE: This sheet is a summary. It may not cover all possible information. If you have questions about this medicine, talk to your doctor, pharmacist, or health care provider.  2015, [employer]/Gold Standard. (2007-09-27 14:38:05)

## 2014-02-12 NOTE — Progress Notes (Signed)
[place]  [phone]) [phone] [phone]) [phone]     ID: April Walker OB: Jun 15, 1951  MR#: [id number]  TTS#:[id number]  PCP: Criselda Peaches, MD GYN:  Vania Rea SU: Stark Klein  OTHER MD: [name], Silvano Rusk, Arther Abbott  CHIEF COMPLAINT: Left Breast Cancer, locally advanced TREATMENT: neoadjuvant chemo-immunotherapy   BREAST CANCER HISTORY: From the original integument:  April Walker had routine screening mammography at [facility] 09/14/2013. This showed a possible mass in the left breast. On 10/11/2013 she underwent left diagnostic mammography and ultrasonography which confirmed an irregular mass in the upper outer left breast measuring up to 3 cm. This was firm and fixed at the 2:30 o'clock position, 6 cm from the nipple. Ultrasound showed an irregular hypoechoic mass measuring 2.8 cm. The left axilla showed no evidence of adenopathy.  On 10/11/2013 the patient underwent left breast biopsy, with the pathology [facility] [id number]) showing an invasive ductal carcinoma, high-grade, estrogen receptor 100% positive, progesterone receptor 43% positive, both with strong staining intensity, with an MIB-1 of 87% and HER-2 amplified, the signals ratio being 2.72, the number per cell 3.40.  On 10/23/2013 the patient underwent bilateral breast MRI. This showed the breast and position to be density be. In the left breast there was a 3 cm irregular enhancing mass in the upper outer quadrant. There was a 9 mm oval mass in the inner lower left breast with no definite fatty hilum. There was a 1.4 cm upper left axillary lymph node with a thickened cortex. There were no other abnormal appearing lymph nodes. Ultrasound of the left breast 10/30/2013 found of the left axillary lymph node with the mildly asymmetrical cortex, and a 7 mm her mass in the 7:30 o'clock position of the left breast. Biopsy of both these suspicious areas was performed 10/30/2013, and the preliminary  report is that they're both benign.  The patient's subsequent history is as detailed below  INTERVAL HISTORY: April Walker returns today for followup of her locally advanced left breast carcinoma. She continues to receive neoadjuvant therapy, with today being day 1 cycle 5 of 6 planned q. three-week doses of docetaxel/ carboplatin given with trastuzumab and pertuzumab.  She receives Neulasta on day 2  of each cycle.    April Walker is doing well today.  She does have some signs of early neuropathy that starts the Friday following treatment and lasts for a couple of days.  It is resolved, and she has retained her motor functions when it does occur.  She typically does experience fatigue the week of treatment.  She also has some swelling in her bilateral lower extremities, and she does elevate her feet due to this.  This swelling is worse at night.  She does experience some diarrhea that occurs following treatment and she manages this with Loperamide and Cholestyramine.  Otherwise, she denies fever, chills, nausea, vomiting, constipation, skin/nail changes, or any further concerns.    REVIEW OF SYSTEMS: A 10 point review of systems was conducted and is otherwise negative except for what is noted above.     PAST MEDICAL HISTORY: Past Medical History  Diagnosis Date  . Abnormal cholesterol test   . Hyperlipidemia   . Knee pain, left   . GERD (gastroesophageal reflux disease)   . Arthritis   . Tubulovillous adenoma of colon   . Diverticulosis   . Internal hemorrhoids   . Wears glasses   . Hearing deficit     no hearing aids    PAST SURGICAL  HISTORY: Past Surgical History  Procedure Laterality Date  . Tonsillectomy and adenoidectomy    . Right knee arthroscopy  2011 Dr. Aline Brochure  . Treatment fistula anal    . Upper gastrointestinal endoscopy    . Hemorrhoid surgery    . Colonoscopy  2002    hemorrhoids  . Colonoscopy w/ polypectomy  12/09/2010    1 cm sigmoid polyp - , diverticulosis, hemorrhoids   . Knee surgery    . Knee surgery  2012    Left knee arthroscopy  . Total knee arthroplasty  11/23/2011    Procedure: TOTAL KNEE ARTHROPLASTY;  Surgeon: Carole Civil, MD;  Location: [facility];  Service: Orthopedics;  Laterality: Left;  . Portacath placement Left 11/17/2013    Procedure: INSERTION PORT-A-CATH;  Surgeon: Stark Klein, MD;  Location: [facility];  Service: General;  Laterality: Left;    FAMILY HISTORY Family History  Problem Relation Age of Onset  . Adopted: Yes  . Stroke Mother   . Heart attack Father    the patient is adopted and has no information on her biological family. the patient's adoptive father died at the age of 47 from a myocardial infarction; productive mother died from a stroke at age 60. The patient does not know of any siblings  GYNECOLOGIC HISTORY: (Reviewed 12/18/2013) Menarche age 63. The patient is GX P0. She underwent menopause in her early 26s and took hormone replacement until 2010.  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 She is single, and lives at home with her black lab, [name], and her cats Patches and [name]    ADVANCED DIRECTIVES: Not in place   HEALTH MAINTENANCE:  (Reviewed 12/18/2013) History  Substance Use Topics  . Smoking status: Never Smoker   . Smokeless tobacco: Never Used  . Alcohol Use: Yes     Comment: socially     Colonoscopy: 2012/[name]  PAP: 2014/[name]  Bone density: not on file  Lipid panel: not on file   Allergies  Allergen Reactions  . Sulfonamide Derivatives Anaphylaxis and Hives    Pt. Stated, "large welps on neck immediately."  . Triprolidine-Pse Other (See Comments)    This is Actifed. Increased heart rate to tachycardia.    Current Outpatient Prescriptions  Medication Sig Dispense Refill  . Acetaminophen (TYLENOL EXTRA STRENGTH PO) Take by mouth.      Marland Kitchen acyclovir (ZOVIRAX) 400 MG tablet Take 1  tablet (400 mg total) by mouth 2 (two) times daily.  60 tablet  2  . cholestyramine (QUESTRAN) 4 GM/DOSE powder Take 0.5 packets (2 g total) by mouth 2 (two) times daily as needed.  378 g  12  . dexamethasone (DECADRON) 4 MG tablet 2 tabs by mouth with food twice daily on day before and 3 days after each chemo cycle  60 tablet  1  . lidocaine-prilocaine (EMLA) cream Apply to port 1-2 hrs before each procedure as directed  30 g  6  . loratadine (CLARITIN) 10 MG tablet Take 10 mg by mouth daily as needed. For allergies      . LORazepam (ATIVAN) 0.5 MG tablet Take 1 tablet (0.5 mg total) by mouth at bedtime as needed for anxiety or sleep (or nausea).  30 tablet  0  . omeprazole (PRILOSEC) 20 MG capsule Take 20 mg by mouth as needed.      . ondansetron (ZOFRAN) 8 MG tablet 1 tab by  mouth twice daily x 3 days after chemo, then 1 tab every 12 hrs if needed for nausea  30 tablet  1  . prochlorperazine (COMPAZINE) 10 MG tablet 1 tab by mouth with meals and at bedtime x 3 days after chemo, then 1 tab by mouth every 6 hrs if needed for nausea  30 tablet  3  . simvastatin (ZOCOR) 10 MG tablet Take 10 mg by mouth daily. Pt TAKES AT BEDTIME      . hydrocortisone (ANUSOL-HC) 25 MG suppository Place 1 suppository (25 mg total) rectally at bedtime as needed for hemorrhoids or itching.  10 suppository  0  . oxyCODONE-acetaminophen (ROXICET) 5-325 MG per tablet Take 1-2 tablets by mouth every 4 (four) hours as needed for severe pain.  30 tablet  0   No current facility-administered medications for this visit.    OBJECTIVE: Middle-aged white woman in no acute distress Filed Vitals:   02/12/14 1022  BP: 140/59  Pulse: 90  Temp: 98.3 F (36.8 C)  Resp: 18     Body mass index is 33.35 kg/(m^2).    ECOG FS:1 - Symptomatic but completely ambulatory Filed Weights   02/12/14 1022  Weight: 222 lb 9.6 oz (100.971 kg)   GENERAL: Patient is a well appearing female in no acute distress HEENT:  Sclerae anicteric.   Oropharynx clear and moist. No ulcerations or evidence of oropharyngeal candidiasis. Neck is supple.  NODES:  No cervical, supraclavicular, or axillary lymphadenopathy palpated.  BREAST EXAM:  Deferred. LUNGS:  Clear to auscultation bilaterally.  No wheezes or rhonchi. HEART:  Regular rate and rhythm. No murmur appreciated. ABDOMEN:  Soft, nontender.  Positive, normoactive bowel sounds. No organomegaly palpated. MSK:  No focal spinal tenderness to palpation. Full range of motion bilaterally in the upper extremities. EXTREMITIES:  1+ bilateral lower extremity edema SKIN:  Clear with no obvious rashes or skin changes. No nail dyscrasia. NEURO:  Nonfocal. Well oriented.  Appropriate affect.  LAB RESULTS:   Lab Results  Component Value Date   WBC 8.2 02/12/2014   NEUTROABS 6.7* 02/12/2014   HGB 10.8* 02/12/2014   HCT 32.9* 02/12/2014   MCV 87.4 02/12/2014   PLT 290 02/12/2014      Chemistry      Component Value Date/Time   NA 144 01/29/2014 0934   NA 135 11/24/2011 0513   K 3.3* 01/29/2014 0934   K 3.9 11/24/2011 0513   CL 102 11/24/2011 0513   CO2 28 01/29/2014 0934   CO2 25 11/24/2011 0513   BUN 11.3 01/29/2014 0934   BUN 11 11/24/2011 0513   CREATININE 0.7 01/29/2014 0934   CREATININE 0.54 11/24/2011 0513      Component Value Date/Time   CALCIUM 9.6 01/29/2014 0934   CALCIUM 9.0 11/24/2011 0513   ALKPHOS 112 01/29/2014 0934   AST 17 01/29/2014 0934   ALT 21 01/29/2014 0934   BILITOT 0.60 01/29/2014 0934       STUDIES: Mr Breast Bilateral W Wo Contrast  01/09/2014   CLINICAL DATA:  Patient with history of left breast carcinoma. Evaluate response to neoadjuvant chemotherapy.  EXAM: BILATERAL BREAST MRI WITH AND WITHOUT CONTRAST  TECHNIQUE: Multiplanar, multisequence MR images of both breasts were obtained prior to and following the intravenous administration of 67m of MultiHance.  THREE-DIMENSIONAL MR IMAGE RENDERING ON INDEPENDENT WORKSTATION:  Three-dimensional MR images were rendered by  post-processing of the original MR data on an independent workstation. The three-dimensional MR images were interpreted, and findings are reported  in the following complete MRI report for this study. Three dimensional images were evaluated at the independent DynaCad workstation  COMPARISON:  Previous exams  FINDINGS: Breast composition: b.  Scattered fibroglandular tissue.  Background parenchymal enhancement: Mild  Right breast: No mass or abnormal enhancement.  Left breast: Previously described mass within the upper-outer left breast middle depth demonstrates no residual contrast enhancement. Residual nonenhancing tissue measures 1.8 x 1.3 cm, previously 3.1 x 2.9 cm at the same level.  Susceptibility artifact is demonstrated within the lower inner left breast anterior depth at the site of previously biopsied enhancing mass. This was biopsied and represented benign pathology.  No concerning areas of enhancement are identified within the left breast.  Lymph nodes: No abnormal appearing lymph nodes.  Ancillary findings:  None.  IMPRESSION: No residual enhancement identified within the known left breast carcinoma.  RECOMMENDATION: Treatment plan  BI-RADS CATEGORY  6: Known biopsy-proven malignancy.   Electronically Signed   By: Lovey Newcomer M.D.   On: 01/09/2014 12:32    ASSESSMENT: 63 y.o. [place] woman status post left breast biopsy 10/11/2013 for a clinical T2 N0, stage IIA invasive ductal carcinoma, grade 3, estrogen and progesterone receptor positive, with an MIB-1 of 87%, and HER-2 amplified  (a) additional Left breast and left axillary node biopsy 10/30/2013 benign  (1) started neoadjuvant therapy 11/20/2013 , consisting of carboplatin, docetaxel, trastuzumab and pertuzumab every 3 weeks x6 with Neulasta day 2  (2) trastuzumab alone to continue to complete a year; most recent echocardiogram 01/24/2014 shows a well preserved ejection fraction.  (3) surgery to follow chemotherapy  (4) radiation to  follow surgery  (5) anti-estrogens to follow radiation  PLAN:  April Walker is doing well today.  Her CBC is stable and I reviewed it with her in detail.  She is slightly anemic, which is likely treatment related.  She will proceed with cycle 5 day 1 of Docetaxel, Carboplatin, Trastuzumab, and Pertuzumab today.    I recommended that she take Super B complex daily for her early neuropathy, it has not progressed and remains stable from her last appointment.  In regards to the swelling, this is mild and she will continue to elevate her feet when possible.  She will continue to manage her diarrhea with Loperamide and Cholestyramine.    Emma will return tomorrow for Neulasta and in one week for labs and evaluation of chemotoxicities.   She knows to call us in the interim for any questions or concerns.  We can certainly see her sooner if needed.  I spent 25 minutes counseling the patient face to face.  The total time spent in the appointment was 30 minutes.    Minette Headland, [place] [phone] 02/12/2014 10:41 AM

## 2014-02-13 ENCOUNTER — Ambulatory Visit (HOSPITAL_BASED_OUTPATIENT_CLINIC_OR_DEPARTMENT_OTHER): Payer: Medicare Other

## 2014-02-13 ENCOUNTER — Other Ambulatory Visit (INDEPENDENT_AMBULATORY_CARE_PROVIDER_SITE_OTHER): Payer: Self-pay | Admitting: General Surgery

## 2014-02-13 ENCOUNTER — Other Ambulatory Visit: Payer: Self-pay | Admitting: Hematology and Oncology

## 2014-02-13 VITALS — BP 141/66 | HR 82

## 2014-02-13 DIAGNOSIS — C50419 Malignant neoplasm of upper-outer quadrant of unspecified female breast: Secondary | ICD-10-CM

## 2014-02-13 DIAGNOSIS — Z5189 Encounter for other specified aftercare: Secondary | ICD-10-CM

## 2014-02-13 DIAGNOSIS — C50412 Malignant neoplasm of upper-outer quadrant of left female breast: Secondary | ICD-10-CM

## 2014-02-13 DIAGNOSIS — C50912 Malignant neoplasm of unspecified site of left female breast: Secondary | ICD-10-CM

## 2014-02-13 MED ORDER — PEGFILGRASTIM INJECTION 6 MG/0.6ML
6.0000 mg | Freq: Once | SUBCUTANEOUS | Status: AC
  Administered 2014-02-13: 6 mg via SUBCUTANEOUS
  Filled 2014-02-13: qty 0.6

## 2014-02-13 NOTE — Patient Instructions (Signed)
Pegfilgrastim injection What is this medicine? PEGFILGRASTIM (peg fil GRA stim) is a long-acting granulocyte colony-stimulating factor that stimulates the growth of neutrophils, a type of white blood cell important in the body's fight against infection. It is used to reduce the incidence of fever and infection in patients with certain types of cancer who are receiving chemotherapy that affects the bone marrow. This medicine may be used for other purposes; ask your health care provider or pharmacist if you have questions. COMMON BRAND NAME(S): Neulasta What should I tell my health care provider before I take this medicine? They need to know if you have any of these conditions: -latex allergy -ongoing radiation therapy -sickle cell disease -skin reactions to acrylic adhesives (On-Body Injector only) -an unusual or allergic reaction to pegfilgrastim, filgrastim, other medicines, foods, dyes, or preservatives -pregnant or trying to get pregnant -breast-feeding How should I use this medicine? This medicine is for injection under the skin. If you get this medicine at home, you will be taught how to prepare and give the pre-filled syringe or how to use the On-body Injector. Refer to the patient Instructions for Use for detailed instructions. Use exactly as directed. Take your medicine at regular intervals. Do not take your medicine more often than directed. It is important that you put your used needles and syringes in a special sharps container. Do not put them in a trash can. If you do not have a sharps container, call your pharmacist or healthcare provider to get one. Talk to your pediatrician regarding the use of this medicine in children. Special care may be needed. Overdosage: If you think you have taken too much of this medicine contact a poison control center or emergency room at once. NOTE: This medicine is only for you. Do not share this medicine with others. What if I miss a dose? It is  important not to miss your dose. Call your doctor or health care professional if you miss your dose. If you miss a dose due to an On-body Injector failure or leakage, a new dose should be administered as soon as possible using a single prefilled syringe for manual use. What may interact with this medicine? Interactions have not been studied. Give your health care provider a list of all the medicines, herbs, non-prescription drugs, or dietary supplements you use. Also tell them if you smoke, drink alcohol, or use illegal drugs. Some items may interact with your medicine. This list may not describe all possible interactions. Give your health care provider a list of all the medicines, herbs, non-prescription drugs, or dietary supplements you use. Also tell them if you smoke, drink alcohol, or use illegal drugs. Some items may interact with your medicine. What should I watch for while using this medicine? You may need blood work done while you are taking this medicine. If you are going to need a MRI, CT scan, or other procedure, tell your doctor that you are using this medicine (On-Body Injector only). What side effects may I notice from receiving this medicine? Side effects that you should report to your doctor or health care professional as soon as possible: -allergic reactions like skin rash, itching or hives, swelling of the face, lips, or tongue -dizziness -fever -pain, redness, or irritation at site where injected -pinpoint red spots on the skin -shortness of breath or breathing problems -stomach or side pain, or pain at the shoulder -swelling -tiredness -trouble passing urine Side effects that usually do not require medical attention (report to your doctor   or health care professional if they continue or are bothersome): -bone pain -muscle pain This list may not describe all possible side effects. Call your doctor for medical advice about side effects. You may report side effects to FDA at  1-800-FDA-1088. Where should I keep my medicine? Keep out of the reach of children. Store pre-filled syringes in a refrigerator between 2 and 8 degrees C (36 and 46 degrees F). Do not freeze. Keep in carton to protect from light. Throw away this medicine if it is left out of the refrigerator for more than 48 hours. Throw away any unused medicine after the expiration date. NOTE: This sheet is a summary. It may not cover all possible information. If you have questions about this medicine, talk to your doctor, pharmacist, or health care provider.  2015, [employer]. (2013-09-21 16:14:05)  

## 2014-02-19 ENCOUNTER — Ambulatory Visit (HOSPITAL_BASED_OUTPATIENT_CLINIC_OR_DEPARTMENT_OTHER): Payer: Medicare Other | Admitting: Adult Health

## 2014-02-19 ENCOUNTER — Other Ambulatory Visit (HOSPITAL_BASED_OUTPATIENT_CLINIC_OR_DEPARTMENT_OTHER): Payer: Medicare Other

## 2014-02-19 ENCOUNTER — Encounter: Payer: Self-pay | Admitting: Adult Health

## 2014-02-19 VITALS — BP 144/78 | HR 101 | Temp 98.3°F | Resp 20 | Ht 68.5 in | Wt 210.2 lb

## 2014-02-19 DIAGNOSIS — E876 Hypokalemia: Secondary | ICD-10-CM

## 2014-02-19 DIAGNOSIS — C50419 Malignant neoplasm of upper-outer quadrant of unspecified female breast: Secondary | ICD-10-CM

## 2014-02-19 DIAGNOSIS — C50412 Malignant neoplasm of upper-outer quadrant of left female breast: Secondary | ICD-10-CM

## 2014-02-19 DIAGNOSIS — R197 Diarrhea, unspecified: Secondary | ICD-10-CM

## 2014-02-19 DIAGNOSIS — Z170 Estrogen receptor positive status [ER+]: Secondary | ICD-10-CM

## 2014-02-19 LAB — CBC WITH DIFFERENTIAL/PLATELET
BASO%: 0.8 % (ref 0.0–2.0)
Basophils Absolute: 0.1 10*3/uL (ref 0.0–0.1)
EOS%: 0 % (ref 0.0–7.0)
Eosinophils Absolute: 0 10*3/uL (ref 0.0–0.5)
HEMATOCRIT: 35.7 % (ref 34.8–46.6)
HEMOGLOBIN: 11.7 g/dL (ref 11.6–15.9)
LYMPH%: 29 % (ref 14.0–49.7)
MCH: 28.7 pg (ref 25.1–34.0)
MCHC: 32.9 g/dL (ref 31.5–36.0)
MCV: 87.4 fL (ref 79.5–101.0)
MONO#: 1.4 10*3/uL — AB (ref 0.1–0.9)
MONO%: 18.3 % — ABNORMAL HIGH (ref 0.0–14.0)
NEUT#: 3.9 10*3/uL (ref 1.5–6.5)
NEUT%: 51.9 % (ref 38.4–76.8)
Platelets: 289 10*3/uL (ref 145–400)
RBC: 4.09 10*6/uL (ref 3.70–5.45)
RDW: 18.7 % — AB (ref 11.2–14.5)
WBC: 7.5 10*3/uL (ref 3.9–10.3)
lymph#: 2.2 10*3/uL (ref 0.9–3.3)

## 2014-02-19 LAB — COMPREHENSIVE METABOLIC PANEL (CC13)
ALT: 27 U/L (ref 0–55)
ANION GAP: 11 meq/L (ref 3–11)
AST: 19 U/L (ref 5–34)
Albumin: 3.9 g/dL (ref 3.5–5.0)
Alkaline Phosphatase: 107 U/L (ref 40–150)
BUN: 19.6 mg/dL (ref 7.0–26.0)
CALCIUM: 9.6 mg/dL (ref 8.4–10.4)
CHLORIDE: 109 meq/L (ref 98–109)
CO2: 24 meq/L (ref 22–29)
CREATININE: 0.8 mg/dL (ref 0.6–1.1)
Glucose: 109 mg/dl (ref 70–140)
Potassium: 2.9 mEq/L — CL (ref 3.5–5.1)
Sodium: 143 mEq/L (ref 136–145)
TOTAL PROTEIN: 6.8 g/dL (ref 6.4–8.3)
Total Bilirubin: 0.46 mg/dL (ref 0.20–1.20)

## 2014-02-19 MED ORDER — POTASSIUM CHLORIDE ER 10 MEQ PO TBCR: 20.0000 meq | EXTENDED_RELEASE_TABLET | Freq: Two times a day (BID) | ORAL | Status: DC

## 2014-02-19 NOTE — Progress Notes (Signed)
April Walker  [phone] [phone]     ID: April Walker OB: [birth year]  MR#: [id number]  VOJ#:[id number]  PCP: Criselda Peaches, MD GYN:  Vania Rea SU: Stark Klein  OTHER MD: [name], Silvano Rusk, Arther Abbott  CHIEF COMPLAINT: Left Breast Cancer, locally advanced TREATMENT: neoadjuvant chemo-immunotherapy   BREAST CANCER HISTORY: From the original integument:  April Walker had routine screening mammography at [facility] 09/14/2013. This showed a possible mass in the left breast. On 10/11/2013 she underwent left diagnostic mammography and ultrasonography which confirmed an irregular mass in the upper outer left breast measuring up to 3 cm. This was firm and fixed at the 2:30 o'clock position, 6 cm from the nipple. Ultrasound showed an irregular hypoechoic mass measuring 2.8 cm. The left axilla showed no evidence of adenopathy.  On 10/11/2013 the patient underwent left breast biopsy, with the pathology [facility] [phone]) showing an invasive ductal carcinoma, high-grade, estrogen receptor 100% positive, progesterone receptor 43% positive, both with strong staining intensity, with an MIB-1 of 87% and HER-2 amplified, the signals ratio being 2.72, the number per cell 3.40.  On 10/23/2013 the patient underwent bilateral breast MRI. This showed the breast and position to be density be. In the left breast there was a 3 cm irregular enhancing mass in the upper outer quadrant. There was a 9 mm oval mass in the inner lower left breast with no definite fatty hilum. There was a 1.4 cm upper left axillary lymph node with a thickened cortex. There were no other abnormal appearing lymph nodes. Ultrasound of the left breast 10/30/2013 found of the left axillary lymph node with the mildly asymmetrical cortex, and a 7 mm her mass in the 7:30 o'clock position of the left breast. Biopsy of both these suspicious areas was performed 10/30/2013, and the preliminary  report is that they're both benign.  The patient's subsequent history is as detailed below  INTERVAL HISTORY: April Walker returns today for followup of her locally advanced left breast carcinoma. She continues to receive neoadjuvant therapy, with today being day 8 cycle 5 of 6 planned q. three-week doses of docetaxel/ carboplatin given with trastuzumab and pertuzumab on day 1 of a 21 day cycle.  She receives Neulasta on day 2  of each cycle.    April Walker is doing well today.  She tells me that with this cycle she was more fatigued and "wiped out".  She could only do things for about 10 minutes at a time.  She has been eating and drinking without difficulty.  She had increased bone aches following the Neulasta despite taking Tylenol and Claritin.  She tells me that she feels good today.  She does continue to have mild numbness in her fingertips that began Thursday, 02/15/14.  It has remained thus far.  She did have diarrhea following treatment, and took Imodium and Questran, and was manageable for the most part.  She tells me she had 2-3 liquid bowel movements a day.  She also tells me that she thinks she may have eaten food that was spoiled.  She otherwise denies fevers, chills, nausea, vomiting, constipation, skin changes, or any further concerns.     REVIEW OF SYSTEMS: A 10 point review of systems was conducted and is otherwise negative except for what is noted above.     PAST MEDICAL HISTORY: Past Medical History  Diagnosis Date  . Abnormal cholesterol test   . Hyperlipidemia   . Knee pain, left   . GERD (  gastroesophageal reflux disease)   . Arthritis   . Tubulovillous adenoma of colon   . Diverticulosis   . Internal hemorrhoids   . Wears glasses   . Hearing deficit     no hearing aids    PAST SURGICAL HISTORY: Past Surgical History  Procedure Laterality Date  . Tonsillectomy and adenoidectomy    . Right knee arthroscopy  2011 Dr. Aline Brochure  . Treatment fistula anal    . Upper  gastrointestinal endoscopy    . Hemorrhoid surgery    . Colonoscopy  2002    hemorrhoids  . Colonoscopy w/ polypectomy  12/09/2010    1 cm sigmoid polyp - , diverticulosis, hemorrhoids  . Knee surgery    . Knee surgery  2012    Left knee arthroscopy  . Total knee arthroplasty  11/23/2011    Procedure: TOTAL KNEE ARTHROPLASTY;  Surgeon: Carole Civil, MD;  Location: [facility];  Service: Orthopedics;  Laterality: Left;  . Portacath placement Left 11/17/2013    Procedure: INSERTION PORT-A-CATH;  Surgeon: Stark Klein, MD;  Location: [place];  Service: General;  Laterality: Left;    FAMILY HISTORY Family History  Problem Relation Age of Onset  . Adopted: Yes  . Stroke Mother   . Heart attack Father    the patient is adopted and has no information on her biological family. the patient's adoptive father died at the age of 44 from a myocardial infarction; productive mother died from a stroke at age 28. The patient does not know of any siblings  GYNECOLOGIC HISTORY: (Reviewed 12/18/2013) Menarche age 54. The patient is GX P0. She underwent menopause in her early 91s and took hormone replacement until 2010.  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 She is single, and lives at home with her black lab, [name], and her cats Patches and [name]    ADVANCED DIRECTIVES: Not in place   HEALTH MAINTENANCE:  (Reviewed 12/18/2013) History  Substance Use Topics  . Smoking status: Never Smoker   . Smokeless tobacco: Never Used  . Alcohol Use: Yes     Comment: socially     Colonoscopy: 2012/[name]  PAP: 2014/[name]  Bone density: not on file  Lipid panel: not on file   Allergies  Allergen Reactions  . Sulfonamide Derivatives Anaphylaxis and Hives    Pt. Stated, "large welps on neck immediately."  . Triprolidine-Pse Other (See Comments)    This is Actifed. Increased heart rate to  tachycardia.    Current Outpatient Prescriptions  Medication Sig Dispense Refill  . Acetaminophen (TYLENOL EXTRA STRENGTH PO) Take by mouth.      . cholestyramine (QUESTRAN) 4 GM/DOSE powder Take 0.5 packets (2 g total) by mouth 2 (two) times daily as needed.  378 g  12  . omeprazole (PRILOSEC) 20 MG capsule Take 20 mg by mouth as needed.      . simvastatin (ZOCOR) 10 MG tablet Take 10 mg by mouth daily. Pt TAKES AT BEDTIME      . acyclovir (ZOVIRAX) 400 MG tablet Take 1 tablet (400 mg total) by mouth 2 (two) times daily.  60 tablet  2  . dexamethasone (DECADRON) 4 MG tablet 2 tabs by mouth with food twice daily on day before and 3 days after each chemo cycle  60 tablet  1  . hydrocortisone (ANUSOL-HC) 25 MG suppository Place 1 suppository (25 mg total) rectally at  bedtime as needed for hemorrhoids or itching.  10 suppository  0  . lidocaine-prilocaine (EMLA) cream Apply to port 1-2 hrs before each procedure as directed  30 g  6  . loratadine (CLARITIN) 10 MG tablet Take 10 mg by mouth daily as needed. For allergies      . LORazepam (ATIVAN) 0.5 MG tablet Take 1 tablet (0.5 mg total) by mouth at bedtime as needed for anxiety or sleep (or nausea).  30 tablet  0  . ondansetron (ZOFRAN) 8 MG tablet 1 tab by mouth twice daily x 3 days after chemo, then 1 tab every 12 hrs if needed for nausea  30 tablet  1  . oxyCODONE-acetaminophen (ROXICET) 5-325 MG per tablet Take 1-2 tablets by mouth every 4 (four) hours as needed for severe pain.  30 tablet  0  . prochlorperazine (COMPAZINE) 10 MG tablet 1 tab by mouth with meals and at bedtime x 3 days after chemo, then 1 tab by mouth every 6 hrs if needed for nausea  30 tablet  3   No current facility-administered medications for this visit.    OBJECTIVE: Middle-aged white Walker in no acute distress Filed Vitals:   02/19/14 0903  BP: 144/78  Pulse: 101  Temp: 98.3 F (36.8 C)  Resp: 20     Body mass index is 31.49 kg/(m^2).    ECOG FS:1 - Symptomatic  but completely ambulatory Filed Weights   02/19/14 0903  Weight: 210 lb 3.2 oz (95.346 kg)   GENERAL: Patient is a well appearing female in no acute distress HEENT:  Sclerae anicteric.  Oropharynx clear and moist. No ulcerations or evidence of oropharyngeal candidiasis. Neck is supple.  NODES:  No cervical, supraclavicular, or axillary lymphadenopathy palpated.  BREAST EXAM:  Deferred. LUNGS:  Clear to auscultation bilaterally.  No wheezes or rhonchi. HEART:  Regular rate and rhythm. No murmur appreciated. ABDOMEN:  Soft, nontender.  Positive, normoactive bowel sounds. No organomegaly palpated. MSK:  No focal spinal tenderness to palpation. Full range of motion bilaterally in the upper extremities. EXTREMITIES:  1+ bilateral lower extremity edema SKIN:  Clear with no obvious rashes or skin changes. No nail dyscrasia. NEURO:  Nonfocal. Well oriented.  Appropriate affect.  LAB RESULTS:   Lab Results  Component Value Date   WBC 7.5 02/19/2014   NEUTROABS 3.9 02/19/2014   HGB 11.7 02/19/2014   HCT 35.7 02/19/2014   MCV 87.4 02/19/2014   PLT 289 02/19/2014      Chemistry      Component Value Date/Time   NA 143 02/19/2014 0851   NA 146* 02/12/2014 1003   K 2.9 Repeated and Verified* 02/19/2014 0851   K 3.8 02/12/2014 1003   CL 106 02/12/2014 1003   CO2 24 02/19/2014 0851   CO2 25 02/12/2014 1003   BUN 19.6 02/19/2014 0851   BUN 15 02/12/2014 1003   CREATININE 0.8 02/19/2014 0851   CREATININE 0.68 02/12/2014 1003      Component Value Date/Time   CALCIUM 9.6 02/19/2014 0851   CALCIUM 10.0 02/12/2014 1003   ALKPHOS 107 02/19/2014 0851   ALKPHOS 92 02/12/2014 1003   AST 19 02/19/2014 0851   AST 16 02/12/2014 1003   ALT 27 02/19/2014 0851   ALT 16 02/12/2014 1003   BILITOT 0.46 02/19/2014 0851   BILITOT 0.4 02/12/2014 1003       STUDIES: Mr Breast Bilateral W Wo Contrast  01/09/2014   CLINICAL DATA:  Patient with history of left breast carcinoma.  Evaluate response to neoadjuvant chemotherapy.   EXAM: BILATERAL BREAST MRI WITH AND WITHOUT CONTRAST  TECHNIQUE: Multiplanar, multisequence MR images of both breasts were obtained prior to and following the intravenous administration of 60m of MultiHance.  THREE-DIMENSIONAL MR IMAGE RENDERING ON INDEPENDENT WORKSTATION:  Three-dimensional MR images were rendered by post-processing of the original MR data on an independent workstation. The three-dimensional MR images were interpreted, and findings are reported in the following complete MRI report for this study. Three dimensional images were evaluated at the independent DynaCad workstation  COMPARISON:  Previous exams  FINDINGS: Breast composition: b.  Scattered fibroglandular tissue.  Background parenchymal enhancement: Mild  Right breast: No mass or abnormal enhancement.  Left breast: Previously described mass within the upper-outer left breast middle depth demonstrates no residual contrast enhancement. Residual nonenhancing tissue measures 1.8 x 1.3 cm, previously 3.1 x 2.9 cm at the same level.  Susceptibility artifact is demonstrated within the lower inner left breast anterior depth at the site of previously biopsied enhancing mass. This was biopsied and represented benign pathology.  No concerning areas of enhancement are identified within the left breast.  Lymph nodes: No abnormal appearing lymph nodes.  Ancillary findings:  None.  IMPRESSION: No residual enhancement identified within the known left breast carcinoma.  RECOMMENDATION: Treatment plan  BI-RADS CATEGORY  6: Known biopsy-proven malignancy.   Electronically Signed   By: DLovey NewcomerM.D.   On: 01/09/2014 12:32    ASSESSMENT: 63y.o. April Walker status post left breast biopsy 10/11/2013 for a clinical T2 N0, stage IIA invasive ductal carcinoma, grade 3, estrogen and progesterone receptor positive, with an MIB-1 of 87%, and HER-2 amplified  (a) additional Left breast and left axillary node biopsy 10/30/2013 benign  (1) started  neoadjuvant therapy 11/20/2013 , consisting of carboplatin, docetaxel, trastuzumab and pertuzumab every 3 weeks x6 with Neulasta day 2.  Breast MRI on 01/08/14 demonstrated no residual enhancement.    (2) trastuzumab alone to continue to complete a year; most recent echocardiogram 01/24/2014 shows a well preserved ejection fraction.  (3) surgery to follow chemotherapy  (4) radiation to follow surgery  (5) anti-estrogens to follow radiation  PLAN:   ITamicois doing well today.  I reviewed her labs with her in detail.  Her hemoglobin is improved.  She tolerated treatment moderately well.    Her potassium is 2.9 today and is likely related to her diarrhea from treatment and possibly spoiled food.  I gave her detailed information on hypokalemia in her AVS and also prescribed potassium supplementation BID x 7 days for her to take.    IEmmalenedoes have a very mild neuropathy and she tells me that usually it resolves within the next couple of days.  We will need to follow up on this at her next appointment.    April Walker return in 2 weeks for labs, evaluation, and her sixth and final cycle of Docetaxel, Carboplatin, Trastuzumab and Pertuzumab.  I spent 25 minutes counseling the patient face to face.  The total time spent in the appointment was 30 minutes.  LMinette Walker [phone]/17/2015 9:38 AM

## 2014-02-19 NOTE — Patient Instructions (Signed)

## 2014-03-05 ENCOUNTER — Telehealth: Payer: Self-pay | Admitting: Adult Health

## 2014-03-05 ENCOUNTER — Other Ambulatory Visit (HOSPITAL_BASED_OUTPATIENT_CLINIC_OR_DEPARTMENT_OTHER): Payer: Medicare Other

## 2014-03-05 ENCOUNTER — Other Ambulatory Visit: Payer: Self-pay | Admitting: Oncology

## 2014-03-05 ENCOUNTER — Ambulatory Visit (HOSPITAL_BASED_OUTPATIENT_CLINIC_OR_DEPARTMENT_OTHER): Payer: Medicare Other | Admitting: Adult Health

## 2014-03-05 ENCOUNTER — Encounter: Payer: Self-pay | Admitting: Adult Health

## 2014-03-05 ENCOUNTER — Ambulatory Visit (HOSPITAL_BASED_OUTPATIENT_CLINIC_OR_DEPARTMENT_OTHER): Payer: Medicare Other

## 2014-03-05 VITALS — BP 147/68 | HR 86 | Temp 98.2°F | Resp 18 | Ht 68.5 in | Wt 220.2 lb

## 2014-03-05 DIAGNOSIS — Z170 Estrogen receptor positive status [ER+]: Secondary | ICD-10-CM

## 2014-03-05 DIAGNOSIS — E876 Hypokalemia: Secondary | ICD-10-CM

## 2014-03-05 DIAGNOSIS — Z5112 Encounter for antineoplastic immunotherapy: Secondary | ICD-10-CM

## 2014-03-05 DIAGNOSIS — Z5111 Encounter for antineoplastic chemotherapy: Secondary | ICD-10-CM

## 2014-03-05 DIAGNOSIS — C50412 Malignant neoplasm of upper-outer quadrant of left female breast: Secondary | ICD-10-CM

## 2014-03-05 DIAGNOSIS — C50419 Malignant neoplasm of upper-outer quadrant of unspecified female breast: Secondary | ICD-10-CM

## 2014-03-05 LAB — COMPREHENSIVE METABOLIC PANEL (CC13)
ALK PHOS: 86 U/L (ref 40–150)
ALT: 22 U/L (ref 0–55)
AST: 20 U/L (ref 5–34)
Albumin: 3.7 g/dL (ref 3.5–5.0)
Anion Gap: 13 mEq/L — ABNORMAL HIGH (ref 3–11)
BUN: 13.3 mg/dL (ref 7.0–26.0)
CHLORIDE: 111 meq/L — AB (ref 98–109)
CO2: 22 mEq/L (ref 22–29)
Calcium: 9.4 mg/dL (ref 8.4–10.4)
Creatinine: 0.8 mg/dL (ref 0.6–1.1)
Glucose: 188 mg/dl — ABNORMAL HIGH (ref 70–140)
Potassium: 3.5 mEq/L (ref 3.5–5.1)
SODIUM: 146 meq/L — AB (ref 136–145)
Total Bilirubin: 0.64 mg/dL (ref 0.20–1.20)
Total Protein: 6.6 g/dL (ref 6.4–8.3)

## 2014-03-05 LAB — CBC WITH DIFFERENTIAL/PLATELET
BASO%: 0 % (ref 0.0–2.0)
BASOS ABS: 0 10*3/uL (ref 0.0–0.1)
EOS%: 0 % (ref 0.0–7.0)
Eosinophils Absolute: 0 10*3/uL (ref 0.0–0.5)
HCT: 33.4 % — ABNORMAL LOW (ref 34.8–46.6)
HGB: 10.8 g/dL — ABNORMAL LOW (ref 11.6–15.9)
LYMPH#: 0.6 10*3/uL — AB (ref 0.9–3.3)
LYMPH%: 10.1 % — ABNORMAL LOW (ref 14.0–49.7)
MCH: 29.4 pg (ref 25.1–34.0)
MCHC: 32.3 g/dL (ref 31.5–36.0)
MCV: 91 fL (ref 79.5–101.0)
MONO#: 0 10*3/uL — ABNORMAL LOW (ref 0.1–0.9)
MONO%: 0.3 % (ref 0.0–14.0)
NEUT#: 5.6 10*3/uL (ref 1.5–6.5)
NEUT%: 89.6 % — ABNORMAL HIGH (ref 38.4–76.8)
Platelets: 178 10*3/uL (ref 145–400)
RBC: 3.67 10*6/uL — AB (ref 3.70–5.45)
RDW: 17.1 % — AB (ref 11.2–14.5)
WBC: 6.3 10*3/uL (ref 3.9–10.3)

## 2014-03-05 MED ORDER — DOCETAXEL CHEMO INJECTION 160 MG/16ML
75.0000 mg/m2 | Freq: Once | INTRAVENOUS | Status: AC
  Administered 2014-03-05: 160 mg via INTRAVENOUS
  Filled 2014-03-05: qty 16

## 2014-03-05 MED ORDER — ACETAMINOPHEN 325 MG PO TABS
650.0000 mg | ORAL_TABLET | Freq: Once | ORAL | Status: AC
  Administered 2014-03-05: 650 mg via ORAL

## 2014-03-05 MED ORDER — SODIUM CHLORIDE 0.9 % IV SOLN
564.0000 mg | Freq: Once | INTRAVENOUS | Status: AC
  Administered 2014-03-05: 560 mg via INTRAVENOUS
  Filled 2014-03-05: qty 56

## 2014-03-05 MED ORDER — DIPHENHYDRAMINE HCL 25 MG PO CAPS
25.0000 mg | ORAL_CAPSULE | Freq: Once | ORAL | Status: AC
  Administered 2014-03-05: 25 mg via ORAL

## 2014-03-05 MED ORDER — ONDANSETRON 16 MG/50ML IVPB (CHCC)
INTRAVENOUS | Status: AC
  Filled 2014-03-05: qty 16

## 2014-03-05 MED ORDER — ONDANSETRON 16 MG/50ML IVPB (CHCC)
16.0000 mg | Freq: Once | INTRAVENOUS | Status: AC
  Administered 2014-03-05: 16 mg via INTRAVENOUS

## 2014-03-05 MED ORDER — SODIUM CHLORIDE 0.9 % IV SOLN
Freq: Once | INTRAVENOUS | Status: AC
  Administered 2014-03-05: 10:00:00 via INTRAVENOUS

## 2014-03-05 MED ORDER — DEXAMETHASONE SODIUM PHOSPHATE 20 MG/5ML IJ SOLN
INTRAMUSCULAR | Status: AC
  Filled 2014-03-05: qty 5

## 2014-03-05 MED ORDER — ACETAMINOPHEN 325 MG PO TABS
ORAL_TABLET | ORAL | Status: AC
  Filled 2014-03-05: qty 2

## 2014-03-05 MED ORDER — TRASTUZUMAB CHEMO INJECTION 440 MG
6.0000 mg/kg | Freq: Once | INTRAVENOUS | Status: AC
  Administered 2014-03-05: 588 mg via INTRAVENOUS
  Filled 2014-03-05: qty 28

## 2014-03-05 MED ORDER — PERTUZUMAB CHEMO INJECTION 420 MG/14ML
420.0000 mg | Freq: Once | INTRAVENOUS | Status: AC
  Administered 2014-03-05: 420 mg via INTRAVENOUS
  Filled 2014-03-05: qty 14

## 2014-03-05 MED ORDER — HEPARIN SOD (PORK) LOCK FLUSH 100 UNIT/ML IV SOLN
500.0000 [IU] | Freq: Once | INTRAVENOUS | Status: AC | PRN
  Administered 2014-03-05: 500 [IU]
  Filled 2014-03-05: qty 5

## 2014-03-05 MED ORDER — DEXAMETHASONE SODIUM PHOSPHATE 20 MG/5ML IJ SOLN
20.0000 mg | Freq: Once | INTRAMUSCULAR | Status: AC
  Administered 2014-03-05: 20 mg via INTRAVENOUS

## 2014-03-05 MED ORDER — SODIUM CHLORIDE 0.9 % IJ SOLN
10.0000 mL | INTRAMUSCULAR | Status: DC | PRN
  Administered 2014-03-05: 10 mL
  Filled 2014-03-05: qty 10

## 2014-03-05 MED ORDER — DIPHENHYDRAMINE HCL 25 MG PO CAPS
ORAL_CAPSULE | ORAL | Status: AC
  Filled 2014-03-05: qty 1

## 2014-03-05 NOTE — Patient Instructions (Addendum)
[facility] Discharge Instructions for Patients Receiving Chemotherapy  Today you received the following chemotherapy agents Docetaxel/Carboplatin/Herceptin/Perjeta.   To help prevent nausea and vomiting after your treatment, we encourage you to take your nausea medication as directed.    If you develop nausea and vomiting that is not controlled by your nausea medication, call the clinic.   BELOW ARE SYMPTOMS THAT SHOULD BE REPORTED IMMEDIATELY:  *FEVER GREATER THAN 100.5 F  *CHILLS WITH OR WITHOUT FEVER  NAUSEA AND VOMITING THAT IS NOT CONTROLLED WITH YOUR NAUSEA MEDICATION  *UNUSUAL SHORTNESS OF BREATH  *UNUSUAL BRUISING OR BLEEDING  TENDERNESS IN MOUTH AND THROAT WITH OR WITHOUT PRESENCE OF ULCERS  *URINARY PROBLEMS  *BOWEL PROBLEMS  UNUSUAL RASH Items with * indicate a potential emergency and should be followed up as soon as possible.  Feel free to call the clinic you have any questions or concerns. The clinic phone number is [phone].

## 2014-03-05 NOTE — Progress Notes (Signed)
April Walker  [phone] Fax:(336) [phone]     ID: April Walker OB: 1951/02/15  MR#: [id number]  MGQ#:[id number]  PCP: April Peaches, MD GYN:  April Walker SU: April Walker  OTHER MD: April Walker, April Walker, April Walker  CHIEF COMPLAINT: Left Breast Cancer, locally advanced TREATMENT: neoadjuvant chemo-immunotherapy   BREAST CANCER HISTORY: From the original integument:  April Walker had routine screening mammography at [facility] 09/14/2013. This showed a possible mass in the left breast. On 10/11/2013 she underwent left diagnostic mammography and ultrasonography which confirmed an irregular mass in the upper outer left breast measuring up to 3 cm. This was firm and fixed at the 2:30 o'clock position, 6 cm from the nipple. Ultrasound showed an irregular hypoechoic mass measuring 2.8 cm. The left axilla showed no evidence of adenopathy.  On 10/11/2013 the patient underwent left breast biopsy, with the pathology [facility] [phone]) showing an invasive ductal carcinoma, high-grade, estrogen receptor 100% positive, progesterone receptor 43% positive, both with strong staining intensity, with an MIB-1 of 87% and HER-2 amplified, the signals ratio being 2.72, the number per cell 3.40.  On 10/23/2013 the patient underwent bilateral breast MRI. This showed the breast and position to be density be. In the left breast there was a 3 cm irregular enhancing mass in the upper outer quadrant. There was a 9 mm oval mass in the inner lower left breast with no definite fatty hilum. There was a 1.4 cm upper left axillary lymph node with a thickened cortex. There were no other abnormal appearing lymph nodes. Ultrasound of the left breast 10/30/2013 found of the left axillary lymph node with the mildly asymmetrical cortex, and a 7 mm her mass in the 7:30 o'clock position of the left breast. Biopsy of both these suspicious areas was performed 10/30/2013, and the preliminary  report is that they're both benign.  The patient's subsequent history is as detailed below  INTERVAL HISTORY: April Walker returns today for followup of her locally advanced left breast carcinoma. She continues to receive neoadjuvant therapy, with today being day 1 cycle 6 of 6 planned q. three-week doses of docetaxel/ carboplatin given with trastuzumab and pertuzumab on day 1 of a 21 day cycle.  She receives Neulasta on day 2  of each cycle.    April Walker is doing well today.  She is here prior to receiving her treatment and is very excited that this is her last cycle.  Her fatigue continues to be about the same the first week following chemo.  She has no numbness or tingling in her fingertips or toes.  She has no nail dyscrasia.  She denies fevers, chills, nasuea, vomiting, constipation, diarrhea, or any further concerns.    REVIEW OF SYSTEMS: A 10 point review of systems was conducted and is otherwise negative except for what is noted above.     PAST MEDICAL HISTORY: Past Medical History  Diagnosis Date  . Abnormal cholesterol test   . Hyperlipidemia   . Knee pain, left   . GERD (gastroesophageal reflux disease)   . Arthritis   . Tubulovillous adenoma of colon   . Diverticulosis   . Internal hemorrhoids   . Wears glasses   . Hearing deficit     no hearing aids    PAST SURGICAL HISTORY: Past Surgical History  Procedure Laterality Date  . Tonsillectomy and adenoidectomy    . Right knee arthroscopy  2011 Dr. Aline Walker  . Treatment fistula anal    . Upper gastrointestinal  endoscopy    . Hemorrhoid surgery    . Colonoscopy  2002    hemorrhoids  . Colonoscopy w/ polypectomy  12/09/2010    1 cm sigmoid polyp - , diverticulosis, hemorrhoids  . Knee surgery    . Knee surgery  2012    Left knee arthroscopy  . Total knee arthroplasty  11/23/2011    Procedure: TOTAL KNEE ARTHROPLASTY;  Surgeon: April Civil, MD;  Location: [facility];  Service: Orthopedics;  Laterality: Left;  . Portacath  placement Left 11/17/2013    Procedure: INSERTION PORT-A-CATH;  Surgeon: April Klein, MD;  Location: [facility];  Service: General;  Laterality: Left;    FAMILY HISTORY Family History  Problem Relation Age of Onset  . Adopted: Yes  . Stroke Mother   . Heart attack Father    the patient is adopted and has no information on her biological family. the patient's adoptive father died at the age of 54 from a myocardial infarction; productive mother died from a stroke at age 39. The patient does not know of any siblings  GYNECOLOGIC HISTORY: (Reviewed 12/18/2013) Menarche age 72. The patient is GX P0. She underwent menopause in her early 14s and took hormone replacement until 2010.  SOCIAL HISTORY:   (Reviewed 12/18/2013) April Walker worked as the April [facility] at [facility] for many years. She is now retired. She is single, and lives at home with her black lab, April Walker, and her cats April Walker and April Walker    ADVANCED DIRECTIVES: Not in place   HEALTH MAINTENANCE:  (Reviewed 12/18/2013) History  Substance Use Topics  . Smoking status: Never Smoker   . Smokeless tobacco: Never Used  . Alcohol Use: Yes     Comment: socially     Colonoscopy: 2012/[facility]  PAP: 2014/[facility]  Bone density: not on file  Lipid panel: not on file   Allergies  Allergen Reactions  . Sulfonamide Derivatives Anaphylaxis and Hives    Pt. Stated, "large welps on neck immediately."  . Triprolidine-Pseudoephedrine Other (See Comments)    This is Actifed. Increased heart rate to tachycardia.    Current Outpatient Prescriptions  Medication Sig Dispense Refill  . Acetaminophen (TYLENOL EXTRA STRENGTH PO) Take by mouth.      Marland Kitchen acyclovir (ZOVIRAX) 400 MG tablet Take 1 tablet (400 mg total) by mouth 2 (two) times daily.  60 tablet  2  . cholestyramine (QUESTRAN) 4 GM/DOSE powder Take 0.5 packets (2 g total) by mouth 2 (two) times daily as needed.  378 g  12  . dexamethasone (DECADRON) 4 MG tablet 2 tabs by  mouth with food twice daily on day before and 3 days after each chemo cycle  60 tablet  1  . hydrocortisone (ANUSOL-HC) 25 MG suppository Place 1 suppository (25 mg total) rectally at bedtime as needed for hemorrhoids or itching.  10 suppository  0  . lidocaine-prilocaine (EMLA) cream Apply to port 1-2 hrs before each procedure as directed  30 g  6  . loratadine (CLARITIN) 10 MG tablet Take 10 mg by mouth daily as needed. For allergies      . LORazepam (ATIVAN) 0.5 MG tablet Take 1 tablet (0.5 mg total) by mouth at bedtime as needed for anxiety or sleep (or nausea).  30 tablet  0  . omeprazole (PRILOSEC) 20 MG capsule Take 20 mg by mouth as needed.      . ondansetron (ZOFRAN) 8 MG tablet 1 tab by mouth twice daily x 3 days after chemo,  then 1 tab every 12 hrs if needed for nausea  30 tablet  1  . oxyCODONE-acetaminophen (ROXICET) 5-325 MG per tablet Take 1-2 tablets by mouth every 4 (four) hours as needed for severe pain.  30 tablet  0  . potassium chloride (K-DUR) 10 MEQ tablet Take 2 tablets (20 mEq total) by mouth 2 (two) times daily.  28 tablet  0  . prochlorperazine (COMPAZINE) 10 MG tablet 1 tab by mouth with meals and at bedtime x 3 days after chemo, then 1 tab by mouth every 6 hrs if needed for nausea  30 tablet  3  . simvastatin (ZOCOR) 10 MG tablet Take 10 mg by mouth daily. Pt TAKES AT BEDTIME       No current facility-administered medications for this visit.    OBJECTIVE: Middle-aged white woman in no acute distress Filed Vitals:   03/05/14 0854  BP: 147/68  Pulse: 86  Temp: 98.2 F (36.8 C)  Resp: 18     Body mass index is 32.99 kg/(m^2).    ECOG FS:1 - Symptomatic but completely ambulatory Filed Weights   03/05/14 0854  Weight: 220 lb 3.2 oz (99.882 kg)   GENERAL: Patient is a well appearing female in no acute distress HEENT:  Sclerae anicteric.  Oropharynx clear and moist. No ulcerations or evidence of oropharyngeal candidiasis. Neck is supple.  NODES:  No cervical,  supraclavicular, or axillary lymphadenopathy palpated.  BREAST EXAM:  Deferred. LUNGS:  Clear to auscultation bilaterally.  No wheezes or rhonchi. HEART:  Regular rate and rhythm. No murmur appreciated. ABDOMEN:  Soft, nontender.  Positive, normoactive bowel sounds. No organomegaly palpated. MSK:  No focal spinal tenderness to palpation. Full range of motion bilaterally in the upper extremities. EXTREMITIES:  1+ bilateral lower extremity edema SKIN:  Clear with no obvious rashes or skin changes. No nail dyscrasia. NEURO:  Nonfocal. Well oriented.  Appropriate affect.  LAB RESULTS:   Lab Results  Component Value Date   WBC 6.3 03/05/2014   NEUTROABS 5.6 03/05/2014   HGB 10.8* 03/05/2014   HCT 33.4* 03/05/2014   MCV 91.0 03/05/2014   PLT 178 03/05/2014      Chemistry      Component Value Date/Time   NA 143 02/19/2014 0851   NA 146* 02/12/2014 1003   K 2.9 Repeated and Verified* 02/19/2014 0851   K 3.8 02/12/2014 1003   CL 106 02/12/2014 1003   CO2 24 02/19/2014 0851   CO2 25 02/12/2014 1003   BUN 19.6 02/19/2014 0851   BUN 15 02/12/2014 1003   CREATININE 0.8 02/19/2014 0851   CREATININE 0.68 02/12/2014 1003      Component Value Date/Time   CALCIUM 9.6 02/19/2014 0851   CALCIUM 10.0 02/12/2014 1003   ALKPHOS 107 02/19/2014 0851   ALKPHOS 92 02/12/2014 1003   AST 19 02/19/2014 0851   AST 16 02/12/2014 1003   ALT 27 02/19/2014 0851   ALT 16 02/12/2014 1003   BILITOT 0.46 02/19/2014 0851   BILITOT 0.4 02/12/2014 1003       STUDIES: Mr Breast Bilateral W Wo Contrast  01/09/2014   CLINICAL DATA:  Patient with history of left breast carcinoma. Evaluate response to neoadjuvant chemotherapy.  EXAM: BILATERAL BREAST MRI WITH AND WITHOUT CONTRAST  TECHNIQUE: Multiplanar, multisequence MR images of both breasts were obtained prior to and following the intravenous administration of 39m of MultiHance.  THREE-DIMENSIONAL MR IMAGE RENDERING ON INDEPENDENT WORKSTATION:  Three-dimensional MR images were  rendered by post-processing of the  original MR data on an independent workstation. The three-dimensional MR images were interpreted, and findings are reported in the following complete MRI report for this study. Three dimensional images were evaluated at the independent DynaCad workstation  COMPARISON:  Previous exams  FINDINGS: Breast composition: b.  Scattered fibroglandular tissue.  Background parenchymal enhancement: Mild  Right breast: No mass or abnormal enhancement.  Left breast: Previously described mass within the upper-outer left breast middle depth demonstrates no residual contrast enhancement. Residual nonenhancing tissue measures 1.8 x 1.3 cm, previously 3.1 x 2.9 cm at the same level.  Susceptibility artifact is demonstrated within the lower inner left breast anterior depth at the site of previously biopsied enhancing mass. This was biopsied and represented benign pathology.  No concerning areas of enhancement are identified within the left breast.  Lymph nodes: No abnormal appearing lymph nodes.  Ancillary findings:  None.  IMPRESSION: No residual enhancement identified within the known left breast carcinoma.  RECOMMENDATION: Treatment plan  BI-RADS CATEGORY  6: Known biopsy-proven malignancy.   Electronically Signed   By: Lovey Newcomer M.D.   On: 01/09/2014 12:32    ASSESSMENT: 63 y.o. [place] woman status post left breast biopsy 10/11/2013 for a clinical T2 N0, stage IIA invasive ductal carcinoma, grade 3, estrogen and progesterone receptor positive, with an MIB-1 of 87%, and HER-2 amplified  (a) additional Left breast and left axillary node biopsy 10/30/2013 benign  (1) started neoadjuvant therapy 11/20/2013 , consisting of carboplatin, docetaxel, trastuzumab and pertuzumab every 3 weeks x6 with Neulasta day 2.  Breast MRI on 01/08/14 demonstrated no residual enhancement.    (2) trastuzumab alone to continue to complete a year; most recent echocardiogram 01/24/2014 shows a well preserved  ejection fraction.  (3) surgery to follow chemotherapy  (4) radiation to follow surgery  (5) anti-estrogens to follow radiation  PLAN:   April [facility] is doing well today.  Her CBC is stable.  She has a mild anemia that is related to the treatment.  Her CMP is discussed below, and stable.  She will proceed with chemotherapy today, her sixth and final cycle of Carboplatin, Docetaxel, Trastuzumab and Pertuzumab.    She did have a potassium level of 2.9 last week and she took Kdur 68mq BID x 7 days.  Her potassium is 3.5 today.  April Walker am giving her information of potassium rich foods in her AVS.  She will try to eat more of these every day.    [place] return in one week for labs and evaluation of chemotoxicities.  She has surgery on 03/21/14 and will be due for Trastuzumab alone the week of 9/21.  [facility] have requested f/u with Dr. MJana Hakimthat week for him to review her pathology and refer her to radiation oncology.    [facility] spent 25 minutes counseling the patient face to face.  The total time spent in the appointment was 30 minutes.  April Headland [phone]/31/2015 8:59 AM

## 2014-03-05 NOTE — Patient Instructions (Signed)
Potassium Content of Foods  Potassium is a mineral found in many foods and drinks. It helps keep fluids and minerals balanced in your body and affects how steadily your heart beats. Potassium also helps control your blood pressure and keep your muscles and nervous system healthy.  Certain health conditions and medicines may change the balance of potassium in your body. When this happens, you can help balance your level of potassium through the foods that you do or do not eat. Your health care provider or dietitian may recommend an amount of potassium that you should have each day. The following lists of foods provide the amount of potassium (in parentheses) per serving in each item.  HIGH IN POTASSIUM   The following foods and beverages have 200 mg or more of potassium per serving:  · Apricots, 2 raw or 5 dry (200 mg).  · Artichoke, 1 medium (345 mg).  · Avocado, raw,  ¼ each (245 mg).  · Banana, 1 medium (425 mg).  · Beans, lima, or baked beans, canned, ½ cup (280 mg).  · Beans, white, canned, ½ cup (595 mg).  · Beef roast, 3 oz (320 mg).  · Beef, ground, 3 oz (270 mg).  · Beets, raw or cooked, ½ cup (260 mg).  · Bran muffin, 2 oz (300 mg).  · Broccoli, ½ cup (230 mg).  · Brussels sprouts, ½ cup (250 mg).  · Cantaloupe, ½ cup (215 mg).  · Cereal, 100% bran, ½ cup (200-400 mg).  · Cheeseburger, single, fast food, 1 each (225-400 mg).  · Chicken, 3 oz (220 mg).  · Clams, canned, 3 oz (535 mg).  · Crab, 3 oz (225 mg).  · Dates, 5 each (270 mg).  · Dried beans and peas, ½ cup (300-475 mg).  · Figs, dried, 2 each (260 mg).  · Fish: halibut, tuna, cod, snapper, 3 oz (480 mg).  · Fish: salmon, haddock, swordfish, perch, 3 oz (300 mg).  · Fish, tuna, canned 3 oz (200 mg).  · French fries, fast food, 3 oz (470 mg).  · Granola with fruit and nuts, ½ cup (200 mg).  · Grapefruit juice, ½ cup (200 mg).  · Greens, beet, ½ cup (655 mg).  · Honeydew melon, ½ cup (200 mg).  · Kale, raw, 1 cup (300 mg).  · Kiwi, 1 medium (240  mg).  · Kohlrabi, rutabaga, parsnips, ½ cup (280 mg).  · Lentils, ½ cup (365 mg).  · Mango, 1 each (325 mg).  · Milk, chocolate, 1 cup (420 mg).  · Milk: nonfat, low-fat, whole, buttermilk, 1 cup (350-380 mg).  · Molasses, 1 Tbsp (295 mg).  · Mushrooms, ½ cup (280) mg.  · Nectarine, 1 each (275 mg).  · Nuts: almonds, peanuts, hazelnuts, [place], cashew, mixed, 1 oz (200 mg).  · Nuts, pistachios, 1 oz (295 mg).  · Orange, 1 each (240 mg).  · Orange juice, ½ cup (235 mg).  · Papaya, medium, ½ fruit (390 mg).  · Peanut butter, chunky, 2 Tbsp (240 mg).  · Peanut butter, smooth, 2 Tbsp (210 mg).  · Pear, 1 medium (200 mg).  · Pomegranate, 1 whole (400 mg).  · Pomegranate juice, ½ cup (215 mg).  · Pork, 3 oz (350 mg).  · Potato chips, salted, 1 oz (465 mg).  · Potato, baked with skin, 1 medium (925 mg).  · Potatoes, boiled, ½ cup (255 mg).  · Potatoes, mashed, ½ cup (330 mg).  · Prune juice, ½ cup (  370 mg).  · Prunes, 5 each (305 mg).  · Pudding, chocolate, ½ cup (230 mg).  · Pumpkin, canned, ½ cup (250 mg).  · Raisins, seedless, ¼ cup (270 mg).  · Seeds, sunflower or pumpkin, 1 oz (240 mg).  · Soy milk, 1 cup (300 mg).  · Spinach, ½ cup (420 mg).  · Spinach, canned, ½ cup (370 mg).  · Sweet potato, baked with skin, 1 medium (450 mg).  · Swiss chard, ½ cup (480 mg).  · Tomato or vegetable juice, ½ cup (275 mg).  · Tomato sauce or puree, ½ cup (400-550 mg).  · Tomato, raw, 1 medium (290 mg).  · Tomatoes, canned, ½ cup (200-300 mg).  · Turkey, 3 oz (250 mg).  · Wheat germ, 1 oz (250 mg).  · Winter squash, ½ cup (250 mg).  · Yogurt, plain or fruited, 6 oz (260-435 mg).  · Zucchini, ½ cup (220 mg).  MODERATE IN POTASSIUM  The following foods and beverages have 50-200 mg of potassium per serving:  · Apple, 1 each (150 mg).  · Apple juice, ½ cup (150 mg).  · Applesauce, ½ cup (90 mg).  · Apricot nectar, ½ cup (140 mg).  · Asparagus, small spears, ½ cup or 6 spears (155 mg).  · Bagel, cinnamon raisin, 1 each (130 mg).  · Bagel,  egg or plain, 4 in., 1 each (70 mg).  · Beans, green, ½ cup (90 mg).  · Beans, yellow, ½ cup (190 mg).  · Beer, regular, 12 oz (100 mg).  · Beets, canned, ½ cup (125 mg).  · Blackberries, ½ cup (115 mg).  · Blueberries, ½ cup (60 mg).  · Bread, whole wheat, 1 slice (70 mg).  · Broccoli, raw, ½ cup (145 mg).  · Cabbage, ½ cup (150 mg).  · Carrots, cooked or raw, ½ cup (180 mg).  · Cauliflower, raw, ½ cup (150 mg).  · Celery, raw, ½ cup (155 mg).  · Cereal, bran flakes, ½cup (120-150 mg).  · Cheese, cottage, ½ cup (110 mg).  · Cherries, 10 each (150 mg).  · Chocolate, 1½ oz bar (165 mg).  · Coffee, brewed 6 oz (90 mg).  · Corn, ½ cup or 1 ear (195 mg).  · Cucumbers, ½ cup (80 mg).  · Egg, large, 1 each (60 mg).  · Eggplant, ½ cup (60 mg).  · Endive, raw, ½cup (80 mg).  · English muffin, 1 each (65 mg).  · Fish, orange roughy, 3 oz (150 mg).  · Frankfurter, beef or pork, 1 each (75 mg).  · Fruit cocktail, ½ cup (115 mg).  · Grape juice, ½ cup (170 mg).  · Grapefruit, ½ fruit (175 mg).  · Grapes, ½ cup (155 mg).  · Greens: kale, turnip, collard, ½ cup (110-150 mg).  · Ice cream or frozen yogurt, chocolate, ½ cup (175 mg).  · Ice cream or frozen yogurt, vanilla, ½ cup (120-150 mg).  · Lemons, limes, 1 each (80 mg).  · Lettuce, all types, 1 cup (100 mg).  · Mixed vegetables, ½ cup (150 mg).  · Mushrooms, raw, ½ cup (110 mg).  · Nuts: walnuts, pecans, or macadamia, 1 oz (125 mg).  · Oatmeal, ½ cup (80 mg).  · Okra, ½ cup (110 mg).  · Onions, raw, ½ cup (120 mg).  · Peach, 1 each (185 mg).  · Peaches, canned, ½ cup (120 mg).  · Pears, canned, ½ cup (120 mg).  · Peas, green,   frozen, ½ cup (90 mg).  · Peppers, green, ½ cup (130 mg).  · Peppers, red, ½ cup (160 mg).  · Pineapple juice, ½ cup (165 mg).  · Pineapple, fresh or canned, ½ cup (100 mg).  · Plums, 1 each (105 mg).  · Pudding, vanilla, ½ cup (150 mg).  · Raspberries, ½ cup (90 mg).  · Rhubarb, ½ cup (115 mg).  · Rice, wild, ½ cup (80 mg).  · Shrimp, 3 oz (155  mg).  · Spinach, raw, 1 cup (170 mg).  · Strawberries, ½ cup (125 mg).  · Summer squash ½ cup (175-200 mg).  · Swiss chard, raw, 1 cup (135 mg).  · Tangerines, 1 each (140 mg).  · Tea, brewed, 6 oz (65 mg).  · Turnips, ½ cup (140 mg).  · Watermelon, ½ cup (85 mg).  · Wine, red, table, 5 oz (180 mg).  · Wine, white, table, 5 oz (100 mg).  LOW IN POTASSIUM  The following foods and beverages have less than 50 mg of potassium per serving.  · Bread, white, 1 slice (30 mg).  · Carbonated beverages, 12 oz (less than 5 mg).  · Cheese, 1 oz (20-30 mg).  · Cranberries, ½ cup (45 mg).  · Cranberry juice cocktail, ½ cup (20 mg).  · Fats and oils, 1 Tbsp (less than 5 mg).  · Hummus, 1 Tbsp (32 mg).  · Nectar: papaya, mango, or pear, ½ cup (35 mg).  · Rice, white or brown, ½ cup (50 mg).  · Spaghetti or macaroni, ½ cup cooked (30 mg).  · Tortilla, flour or corn, 1 each (50 mg).  · Waffle, 4 in., 1 each (50 mg).  · Water chestnuts, ½ cup (40 mg).  Document Released: 02/03/2005 Document Revised: 06/27/2013 Document Reviewed: 05/19/2013  [employer]® Patient Information ©2015 [employer]. This information is not intended to replace advice given to you by your health care provider. Make sure you discuss any questions you have with your health care provider.

## 2014-03-05 NOTE — Telephone Encounter (Signed)
per pof to sch appt-sent MW email,to sch trmt

## 2014-03-06 ENCOUNTER — Ambulatory Visit (HOSPITAL_BASED_OUTPATIENT_CLINIC_OR_DEPARTMENT_OTHER): Payer: Medicare Other

## 2014-03-06 ENCOUNTER — Telehealth: Payer: Self-pay | Admitting: *Deleted

## 2014-03-06 VITALS — BP 150/76 | HR 84 | Temp 98.1°F

## 2014-03-06 DIAGNOSIS — C50412 Malignant neoplasm of upper-outer quadrant of left female breast: Secondary | ICD-10-CM

## 2014-03-06 DIAGNOSIS — Z5189 Encounter for other specified aftercare: Secondary | ICD-10-CM

## 2014-03-06 DIAGNOSIS — C50419 Malignant neoplasm of upper-outer quadrant of unspecified female breast: Secondary | ICD-10-CM

## 2014-03-06 MED ORDER — PEGFILGRASTIM INJECTION 6 MG/0.6ML
6.0000 mg | Freq: Once | SUBCUTANEOUS | Status: AC
  Administered 2014-03-06: 6 mg via SUBCUTANEOUS
  Filled 2014-03-06: qty 0.6

## 2014-03-06 NOTE — Telephone Encounter (Signed)
Per staff message and POF I have scheduled appts. Advised scheduler of appts. JMW  

## 2014-03-07 ENCOUNTER — Telehealth: Payer: Self-pay | Admitting: Adult Health

## 2014-03-07 NOTE — Telephone Encounter (Signed)
cld & left pt message for time of trmt for 9/21

## 2014-03-13 ENCOUNTER — Other Ambulatory Visit: Payer: 59

## 2014-03-13 ENCOUNTER — Encounter: Payer: Self-pay | Admitting: Adult Health

## 2014-03-13 ENCOUNTER — Telehealth: Payer: Self-pay | Admitting: Adult Health

## 2014-03-13 ENCOUNTER — Other Ambulatory Visit (HOSPITAL_BASED_OUTPATIENT_CLINIC_OR_DEPARTMENT_OTHER): Payer: Medicare Other

## 2014-03-13 ENCOUNTER — Ambulatory Visit (HOSPITAL_BASED_OUTPATIENT_CLINIC_OR_DEPARTMENT_OTHER): Payer: Medicare Other | Admitting: Adult Health

## 2014-03-13 ENCOUNTER — Ambulatory Visit (INDEPENDENT_AMBULATORY_CARE_PROVIDER_SITE_OTHER): Payer: Commercial Managed Care - PPO | Admitting: General Surgery

## 2014-03-13 ENCOUNTER — Other Ambulatory Visit (INDEPENDENT_AMBULATORY_CARE_PROVIDER_SITE_OTHER): Payer: Self-pay | Admitting: General Surgery

## 2014-03-13 VITALS — BP 146/71 | HR 93 | Temp 98.1°F | Resp 20 | Ht 68.5 in | Wt 210.8 lb

## 2014-03-13 DIAGNOSIS — R197 Diarrhea, unspecified: Secondary | ICD-10-CM

## 2014-03-13 DIAGNOSIS — D6481 Anemia due to antineoplastic chemotherapy: Secondary | ICD-10-CM

## 2014-03-13 DIAGNOSIS — E876 Hypokalemia: Secondary | ICD-10-CM

## 2014-03-13 DIAGNOSIS — C250 Malignant neoplasm of head of pancreas: Secondary | ICD-10-CM

## 2014-03-13 DIAGNOSIS — C50419 Malignant neoplasm of upper-outer quadrant of unspecified female breast: Secondary | ICD-10-CM

## 2014-03-13 DIAGNOSIS — G609 Hereditary and idiopathic neuropathy, unspecified: Secondary | ICD-10-CM

## 2014-03-13 DIAGNOSIS — T451X5A Adverse effect of antineoplastic and immunosuppressive drugs, initial encounter: Secondary | ICD-10-CM

## 2014-03-13 DIAGNOSIS — C50412 Malignant neoplasm of upper-outer quadrant of left female breast: Secondary | ICD-10-CM

## 2014-03-13 LAB — COMPREHENSIVE METABOLIC PANEL (CC13)
ALT: 16 U/L (ref 0–55)
ANION GAP: 10 meq/L (ref 3–11)
AST: 15 U/L (ref 5–34)
Albumin: 3.6 g/dL (ref 3.5–5.0)
Alkaline Phosphatase: 99 U/L (ref 40–150)
BILIRUBIN TOTAL: 0.61 mg/dL (ref 0.20–1.20)
BUN: 15.8 mg/dL (ref 7.0–26.0)
CO2: 24 mEq/L (ref 22–29)
Calcium: 8.9 mg/dL (ref 8.4–10.4)
Chloride: 107 mEq/L (ref 98–109)
Creatinine: 0.8 mg/dL (ref 0.6–1.1)
GLUCOSE: 99 mg/dL (ref 70–140)
Potassium: 2.8 mEq/L — CL (ref 3.5–5.1)
Sodium: 142 mEq/L (ref 136–145)
Total Protein: 6.2 g/dL — ABNORMAL LOW (ref 6.4–8.3)

## 2014-03-13 LAB — CBC WITH DIFFERENTIAL/PLATELET
BASO%: 0.1 % (ref 0.0–2.0)
Basophils Absolute: 0 10*3/uL (ref 0.0–0.1)
EOS ABS: 0 10*3/uL (ref 0.0–0.5)
EOS%: 0 % (ref 0.0–7.0)
HEMATOCRIT: 33.9 % — AB (ref 34.8–46.6)
HGB: 10.9 g/dL — ABNORMAL LOW (ref 11.6–15.9)
LYMPH%: 19.3 % (ref 14.0–49.7)
MCH: 29.4 pg (ref 25.1–34.0)
MCHC: 32.2 g/dL (ref 31.5–36.0)
MCV: 91.4 fL (ref 79.5–101.0)
MONO#: 1.3 10*3/uL — ABNORMAL HIGH (ref 0.1–0.9)
MONO%: 10.9 % (ref 0.0–14.0)
NEUT#: 8 10*3/uL — ABNORMAL HIGH (ref 1.5–6.5)
NEUT%: 69.7 % (ref 38.4–76.8)
PLATELETS: 226 10*3/uL (ref 145–400)
RBC: 3.71 10*6/uL (ref 3.70–5.45)
RDW: 16.5 % — ABNORMAL HIGH (ref 11.2–14.5)
WBC: 11.5 10*3/uL — AB (ref 3.9–10.3)
lymph#: 2.2 10*3/uL (ref 0.9–3.3)

## 2014-03-13 MED ORDER — POTASSIUM CHLORIDE ER 10 MEQ PO TBCR: 20.0000 meq | EXTENDED_RELEASE_TABLET | Freq: Two times a day (BID) | ORAL | Status: DC

## 2014-03-13 NOTE — Progress Notes (Signed)
[place]  [phone] [phone]     ID: LYNNIX SCHONEMAN OB: December 30, 1950  MR#: [id number]  FIE#:[id number]  PCP: Criselda Peaches, MD GYN:  Vania Rea SU: Stark Klein  OTHER MD: [name], Silvano Rusk, Arther Abbott  CHIEF COMPLAINT: Left Breast Cancer, locally advanced TREATMENT: neoadjuvant chemo-immunotherapy   BREAST CANCER HISTORY: From the original integument:  Jadaya had routine screening mammography at [facility] 09/14/2013. This showed a possible mass in the left breast. On 10/11/2013 she underwent left diagnostic mammography and ultrasonography which confirmed an irregular mass in the upper outer left breast measuring up to 3 cm. This was firm and fixed at the 2:30 o'clock position, 6 cm from the nipple. Ultrasound showed an irregular hypoechoic mass measuring 2.8 cm. The left axilla showed no evidence of adenopathy.  On 10/11/2013 the patient underwent left breast biopsy, with the pathology [facility] [phone]) showing an invasive ductal carcinoma, high-grade, estrogen receptor 100% positive, progesterone receptor 43% positive, both with strong staining intensity, with an MIB-1 of 87% and HER-2 amplified, the signals ratio being 2.72, the number per cell 3.40.  On 10/23/2013 the patient underwent bilateral breast MRI. This showed the breast and position to be density be. In the left breast there was a 3 cm irregular enhancing mass in the upper outer quadrant. There was a 9 mm oval mass in the inner lower left breast with no definite fatty hilum. There was a 1.4 cm upper left axillary lymph node with a thickened cortex. There were no other abnormal appearing lymph nodes. Ultrasound of the left breast 10/30/2013 found of the left axillary lymph node with the mildly asymmetrical cortex, and a 7 mm her mass in the 7:30 o'clock position of the left breast. Biopsy of both these suspicious areas was performed 10/30/2013, and the preliminary  report is that they're both benign.  The patient's subsequent history is as detailed below  INTERVAL HISTORY: Amana returns today for followup of her locally advanced left breast carcinoma. She continues to receive neoadjuvant therapy, with today being day 8 cycle 6 of 6 planned q. three-week doses of docetaxel/ carboplatin given with trastuzumab and pertuzumab on day 1 of a 21 day cycle.  She receives Neulasta on day 2  of each cycle.    Adaya is doing well today.  She has finished her final cycle of neoadjuvant chemotherapy last week and is doing well today.  She did have some mild diarrhea this week that was relieved with Imodium.  She has some mild numbness in the tips of her fingers that is resolving.  She also has some mild numbness in her toes, that she notes may be shoe dependent.   She denies chest pain, palpitations, fatigue.  She denies fevers, chills, nasuea, vomiting, constipation, diarrhea, skin/nail changes or any further concerns.    REVIEW OF SYSTEMS: A 10 point review of systems was conducted and is otherwise negative except for what is noted above.     PAST MEDICAL HISTORY: Past Medical History  Diagnosis Date  . Abnormal cholesterol test   . Hyperlipidemia   . Knee pain, left   . GERD (gastroesophageal reflux disease)   . Arthritis   . Tubulovillous adenoma of colon   . Diverticulosis   . Internal hemorrhoids   . Wears glasses   . Hearing deficit     no hearing aids    PAST SURGICAL HISTORY: Past Surgical History  Procedure Laterality Date  . Tonsillectomy and adenoidectomy    .  Right knee arthroscopy  2011 Dr. Aline Brochure  . Treatment fistula anal    . Upper gastrointestinal endoscopy    . Hemorrhoid surgery    . Colonoscopy  2002    hemorrhoids  . Colonoscopy w/ polypectomy  12/09/2010    1 cm sigmoid polyp - , diverticulosis, hemorrhoids  . Knee surgery    . Knee surgery  2012    Left knee arthroscopy  . Total knee arthroplasty  11/23/2011    Procedure:  TOTAL KNEE ARTHROPLASTY;  Surgeon: Carole Civil, MD;  Location: [facility];  Service: Orthopedics;  Laterality: Left;  . Portacath placement Left 11/17/2013    Procedure: INSERTION PORT-A-CATH;  Surgeon: Stark Klein, MD;  Location: [place];  Service: General;  Laterality: Left;    FAMILY HISTORY Family History  Problem Relation Age of Onset  . Adopted: Yes  . Stroke Mother   . Heart attack Father    the patient is adopted and has no information on her biological family. the patient's adoptive father died at the age of 4 from a myocardial infarction; productive mother died from a stroke at age 17. The patient does not know of any siblings  GYNECOLOGIC HISTORY: (Reviewed 12/18/2013) Menarche age 63. The patient is GX P0. She underwent menopause in her early 58s and took hormone replacement until 2010.  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 She is single, and lives at home with her black lab, [name], and her cats Patches and [name]    ADVANCED DIRECTIVES: Not in place   HEALTH MAINTENANCE:  (Reviewed 12/18/2013) History  Substance Use Topics  . Smoking status: Never Smoker   . Smokeless tobacco: Never Used  . Alcohol Use: Yes     Comment: socially     Colonoscopy: 2012/[name]  PAP: 2014/[name]  Bone density: not on file  Lipid panel: not on file   Allergies  Allergen Reactions  . Sulfonamide Derivatives Anaphylaxis and Hives    Pt. Stated, "large welps on neck immediately."  . Triprolidine-Pseudoephedrine Other (See Comments)    This is Actifed. Increased heart rate to tachycardia.    Current Outpatient Prescriptions  Medication Sig Dispense Refill  . hydrocortisone (ANUSOL-HC) 25 MG suppository Place 1 suppository (25 mg total) rectally at bedtime as needed for hemorrhoids or itching.  10 suppository  0  . LORazepam (ATIVAN) 0.5 MG tablet Take 1 tablet (0.5 mg  total) by mouth at bedtime as needed for anxiety or sleep (or nausea).  30 tablet  0  . omeprazole (PRILOSEC) 20 MG capsule Take 20 mg by mouth as needed.      . simvastatin (ZOCOR) 10 MG tablet Take 10 mg by mouth daily. Pt TAKES AT BEDTIME      . Acetaminophen (TYLENOL EXTRA STRENGTH PO) Take by mouth.      Marland Kitchen acyclovir (ZOVIRAX) 400 MG tablet Take 1 tablet (400 mg total) by mouth 2 (two) times daily.  60 tablet  2  . cholestyramine (QUESTRAN) 4 GM/DOSE powder Take 0.5 packets (2 g total) by mouth 2 (two) times daily as needed.  378 g  12  . dexamethasone (DECADRON) 4 MG tablet 2 tabs by mouth with food twice daily on day before and 3 days after each chemo cycle  60 tablet  1  . lidocaine-prilocaine (EMLA) cream Apply to port 1-2 hrs before each procedure as directed  30 g  6  .  loratadine (CLARITIN) 10 MG tablet Take 10 mg by mouth daily as needed. For allergies      . ondansetron (ZOFRAN) 8 MG tablet 1 tab by mouth twice daily x 3 days after chemo, then 1 tab every 12 hrs if needed for nausea  30 tablet  1  . oxyCODONE-acetaminophen (ROXICET) 5-325 MG per tablet Take 1-2 tablets by mouth every 4 (four) hours as needed for severe pain.  30 tablet  0  . potassium chloride (K-DUR) 10 MEQ tablet Take 2 tablets (20 mEq total) by mouth 2 (two) times daily.  28 tablet  0  . prochlorperazine (COMPAZINE) 10 MG tablet 1 tab by mouth with meals and at bedtime x 3 days after chemo, then 1 tab by mouth every 6 hrs if needed for nausea  30 tablet  3   No current facility-administered medications for this visit.    OBJECTIVE: Middle-aged white woman in no acute distress Filed Vitals:   03/13/14 0929  BP: 146/71  Pulse: 93  Temp: 98.1 F (36.7 C)  Resp: 20     Body mass index is 31.58 kg/(m^2).    ECOG FS:1 - Symptomatic but completely ambulatory Filed Weights   03/13/14 0929  Weight: 210 lb 12.8 oz (95.618 kg)   GENERAL: Patient is a well appearing female in no acute distress HEENT:  Sclerae  anicteric.  Oropharynx clear and moist. No ulcerations or evidence of oropharyngeal candidiasis. Neck is supple.  NODES:  No cervical, supraclavicular, or axillary lymphadenopathy palpated.  BREAST EXAM:  Deferred. LUNGS:  Clear to auscultation bilaterally.  No wheezes or rhonchi. HEART:  Regular rate and rhythm. No murmur appreciated. ABDOMEN:  Soft, nontender.  Positive, normoactive bowel sounds. No organomegaly palpated. MSK:  No focal spinal tenderness to palpation. Full range of motion bilaterally in the upper extremities. EXTREMITIES:  1+ bilateral lower extremity edema SKIN:  Clear with no obvious rashes or skin changes. No nail dyscrasia. NEURO:  Nonfocal. Well oriented.  Appropriate affect.  LAB RESULTS:   Lab Results  Component Value Date   WBC 11.5* 03/13/2014   NEUTROABS 8.0* 03/13/2014   HGB 10.9* 03/13/2014   HCT 33.9* 03/13/2014   MCV 91.4 03/13/2014   PLT 226 03/13/2014      Chemistry      Component Value Date/Time   NA 142 03/13/2014 0915   NA 146* 02/12/2014 1003   K 2.8* 03/13/2014 0915   K 3.8 02/12/2014 1003   CL 106 02/12/2014 1003   CO2 24 03/13/2014 0915   CO2 25 02/12/2014 1003   BUN 15.8 03/13/2014 0915   BUN 15 02/12/2014 1003   CREATININE 0.8 03/13/2014 0915   CREATININE 0.68 02/12/2014 1003      Component Value Date/Time   CALCIUM 8.9 03/13/2014 0915   CALCIUM 10.0 02/12/2014 1003   ALKPHOS 99 03/13/2014 0915   ALKPHOS 92 02/12/2014 1003   AST 15 03/13/2014 0915   AST 16 02/12/2014 1003   ALT 16 03/13/2014 0915   ALT 16 02/12/2014 1003   BILITOT 0.61 03/13/2014 0915   BILITOT 0.4 02/12/2014 1003       STUDIES: Mr Breast Bilateral W Wo Contrast  01/09/2014   CLINICAL DATA:  Patient with history of left breast carcinoma. Evaluate response to neoadjuvant chemotherapy.  EXAM: BILATERAL BREAST MRI WITH AND WITHOUT CONTRAST  TECHNIQUE: Multiplanar, multisequence MR images of both breasts were obtained prior to and following the intravenous administration of 43m of MultiHance.   THREE-DIMENSIONAL MR IMAGE RENDERING  ON INDEPENDENT WORKSTATION:  Three-dimensional MR images were rendered by post-processing of the original MR data on an independent workstation. The three-dimensional MR images were interpreted, and findings are reported in the following complete MRI report for this study. Three dimensional images were evaluated at the independent DynaCad workstation  COMPARISON:  Previous exams  FINDINGS: Breast composition: b.  Scattered fibroglandular tissue.  Background parenchymal enhancement: Mild  Right breast: No mass or abnormal enhancement.  Left breast: Previously described mass within the upper-outer left breast middle depth demonstrates no residual contrast enhancement. Residual nonenhancing tissue measures 1.8 x 1.3 cm, previously 3.1 x 2.9 cm at the same level.  Susceptibility artifact is demonstrated within the lower inner left breast anterior depth at the site of previously biopsied enhancing mass. This was biopsied and represented benign pathology.  No concerning areas of enhancement are identified within the left breast.  Lymph nodes: No abnormal appearing lymph nodes.  Ancillary findings:  None.  IMPRESSION: No residual enhancement identified within the known left breast carcinoma.  RECOMMENDATION: Treatment plan  BI-RADS CATEGORY  6: Known biopsy-proven malignancy.   Electronically Signed   By: Lovey Newcomer M.D.   On: 01/09/2014 12:32    ASSESSMENT: 63 y.o. [place] woman status post left breast biopsy 10/11/2013 for a clinical T2 N0, stage IIA invasive ductal carcinoma, grade 3, estrogen and progesterone receptor positive, with an MIB-1 of 87%, and HER-2 amplified  (a) additional Left breast and left axillary node biopsy 10/30/2013 benign  (1) started neoadjuvant therapy 11/20/2013 , consisting of carboplatin, docetaxel, trastuzumab and pertuzumab every 3 weeks x6 with Neulasta day 2.  Breast MRI on 01/08/14 demonstrated no residual enhancement.    (2) trastuzumab  alone to continue to complete a year; most recent echocardiogram 01/24/2014 shows a well preserved ejection fraction.  (3) surgery to follow chemotherapy  (4) radiation to follow surgery  (5) anti-estrogens to follow radiation  PLAN:   Hettie is doing well today.  Her CBC is stable.  She continues to have anemia related to treatment that remains stable. I reviewed her labs with her in detail.  She has a potassium level of 2.8 today.  I have prescribed Kdur 81mq tabs, 250m PO BID, for 7 days.  She will return on 9/14 for a lab only to re check her potassium and I have entered a BMP accordingly.  IrDodias given detailed information about hypokalemia in her AVS.    IrGlendiaas a very mild grade 1 neuropathy that we will continue to monitor.  It will likely resolve with the completion of chemotherapy.  The patient does note that she had baseline neuropathy in her toes, which may be slightly worse.    IrLorelaiill return in one week for labs only.  She has surgery on 03/21/14 and will be due for Trastuzumab alone the week of 9/21. She will return on 9/21 for labs, evaluation, and Trastuzumab.     I spent 25 minutes counseling the patient face to face.  The total time spent in the appointment was 30 minutes.  LiMinette [phone]/02/2014 9:48 AM

## 2014-03-13 NOTE — H&P (Signed)
03/13/2014 11:29 AM Location: [facility] Patient #: [id number] DOB: 04/01/1951 Single / Language: April Walker / Race: White Female History of Present Illness Stark Klein MD; 03/13/2014 11:57 AM) The patient is a 63 year old female who presents with breast cancer. The patient is being seen for a consultation for Stage IIA ( T2 and N0 ) invasive ductal carcinoma (triple positive) of the left breast (upper outer quadrant). Tumor markers include estrogen receptor positive, estrogen receptor negative and HER -2/neu positive. The patient was referred by a primary care provider. Initial presentation was 5 month(s) ago for an abnormal mammogram. Current diagnosis was determined by mammography and breast MRI. Treatment has included chemotherapy. Symptoms include hair loss (with chemo). she has completed chemo and is scheduled for surgery next week.  Other Problems Marjean Donna, CMA; 03/13/2014 11:29 AM) Arthritis Breast Cancer Gastroesophageal Reflux Disease Lump In Breast  Past Surgical History Marjean Donna, [place]; 03/13/2014 11:29 AM) Anal Fissure Repair Breast Biopsy Left. Colon Polyp Removal - Colonoscopy Colon Polyp Removal - Open Tonsillectomy  Diagnostic Studies History Marjean Donna, CMA; 03/13/2014 11:29 AM) Colonoscopy 1-5 years ago Mammogram within last year Pap Smear 1-5 years ago  Allergies Marjean Donna, CMA; 03/13/2014 11:32 AM) Sulfa Antibiotics Triprolidine HCl *CHEMICALS*  Medication History (Sonya Bynum, CMA; 03/13/2014 11:31 AM) Oxycodone-Acetaminophen (5-325MG  Tablet, Oral as needed) Active. LORazepam (0.5MG  Tablet, Oral daily) Active. Acyclovir (400MG  Tablet, Oral as needed) Active. Cholestyramine (4GM/DOSE Powder, Oral two times daily) Active. Crestor (5MG  Tablet, Oral daily) Active. Dexamethasone (4MG  Tablet, Oral daily) Active. Durezol (0.05% Emulsion, Ophthalmic as needed) Active. Klor-Con 10 (10MEQ Tablet ER, Oral daily) Active. Ondansetron HCl  (8MG  Tablet, Oral as needed) Active. Simvastatin (10MG  Tablet, Oral daily) Active. Prochlorperazine Maleate (10MG  Tablet, Oral daily) Active.  Social History Marjean Donna, [place]; 03/13/2014 11:29 AM) Alcohol use Occasional alcohol use. Caffeine use Carbonated beverages, Coffee. No drug use Tobacco use Never smoker.  Family History Marjean Donna, CMA; 03/13/2014 11:29 AM) Family history unknown First Degree Relatives  Pregnancy / Birth History Marjean Donna, [place]; 03/13/2014 11:29 AM) Age at menarche 30 years. Age of menopause 90-55 Gravida 0 Para 0 Regular periods     Review of Systems ([place]; 03/13/2014 11:29 AM) General Not Present- Appetite Loss, Chills, Fatigue, Fever, Night Sweats, Weight Gain and Weight Loss. Skin Not Present- Change in Wart/Mole, Dryness, Hives, Jaundice, New Lesions, Non-Healing Wounds, Rash and Ulcer. HEENT Present- Seasonal Allergies and Wears glasses/contact lenses. Not Present- Earache, Hearing Loss, Hoarseness, Nose Bleed, Oral Ulcers, Ringing in the Ears, Sinus Pain, Sore Throat, Visual Disturbances and Yellow Eyes. Respiratory Not Present- Bloody sputum, Chronic Cough, Difficulty Breathing, Snoring and Wheezing. Cardiovascular Not Present- Chest Pain, Difficulty Breathing Lying Down, Leg Cramps, Palpitations, Rapid Heart Rate, Shortness of Breath and Swelling of Extremities. Gastrointestinal Not Present- Abdominal Pain, Bloating, Bloody Stool, Change in Bowel Habits, Chronic diarrhea, Constipation, Difficulty Swallowing, Excessive gas, Gets full quickly at meals, Hemorrhoids, Indigestion, Nausea, Rectal Pain and Vomiting. Female Genitourinary Not Present- Frequency, Nocturia, Painful Urination, Pelvic Pain and Urgency. Musculoskeletal Not Present- Back Pain, Joint Pain, Joint Stiffness, Muscle Pain, Muscle Weakness and Swelling of Extremities. Neurological Not Present- Decreased Memory, Fainting, Headaches, Numbness, Seizures, Tingling,  Tremor, Trouble walking and Weakness. Psychiatric Not Present- Anxiety, Bipolar, Change in Sleep Pattern, Depression, Fearful and Frequent crying. Endocrine Not Present- Cold Intolerance, Excessive Hunger, Hair Changes, Heat Intolerance, Hot flashes and New Diabetes. Hematology Not Present- Easy Bruising, Excessive bleeding, Gland problems, HIV and Persistent Infections.  Vitals (Sonya Bynum CMA;  03/13/2014 11:33 AM) 03/13/2014 11:32 AM Weight: 210 lb Height: 68in Body Surface Area: 2.14 m Body Mass Index: 31.93 kg/m Temp.: 6F(Temporal)  Pulse: 73 (Regular)  BP: 124/78 (Sitting, Left Arm, Standard)     Physical Exam Stark Klein MD; 03/13/2014 11:58 AM)  General Mental Status-Alert. General Appearance-Consistent with stated age. Hydration-Well hydrated. Voice-Normal.  Head and Neck Head-normocephalic, atraumatic with no lesions or palpable masses. Trachea-midline. Thyroid Gland Characteristics - normal size and consistency.  Eye Sclera/Conjunctiva - Bilateral-No scleral icterus.  Breast Note: no palpable masses in either breast. no LAD. no skin dimpling or nipple retraction.   Cardiovascular Cardiovascular examination reveals -normal heart sounds, regular rate and rhythm with no murmurs and normal pedal pulses bilaterally.  Peripheral Vascular Upper Extremity Inspection - Bilateral - Normal - No Clubbing, No Cyanosis, No Edema, Pulses Intact. Palpation - Pulses bilaterally normal. Lower Extremity Palpation - Pulses bilaterally normal.  Neurologic Neurologic evaluation reveals -alert and oriented x 3 with no impairment of recent or remote memory. Mental Status-Normal.  Musculoskeletal Normal Exam - Left-Upper Extremity Strength Normal and Lower Extremity Strength Normal. Normal Exam - Right-Upper Extremity Strength Normal, Lower Extremity Weakness.  Lymphatic Head & Neck  General Head & Neck Lymphatics: Bilateral -  Description - Normal. Axillary  General Axillary Region: Bilateral - Description - Normal. Tenderness - Non Tender. Assessment & Plan Stark Klein MD; 03/13/2014 11:59 AM)  BREAST CANCER, LEFT (174.9  C50.912) Impression: Pt already on schedule for next week for radioactive seed guided lumpectomy and SLN bx on left. We reviewed surgery and risks, benefits.  30 min spent with patient in counseling for 35 min total visit.  Current Plans Follow up in 3 weeks or as needed

## 2014-03-13 NOTE — Telephone Encounter (Signed)
, °

## 2014-03-13 NOTE — Patient Instructions (Signed)

## 2014-03-16 ENCOUNTER — Encounter (HOSPITAL_BASED_OUTPATIENT_CLINIC_OR_DEPARTMENT_OTHER): Payer: Self-pay | Admitting: *Deleted

## 2014-03-16 NOTE — Progress Notes (Signed)
Pt had labs 03/13/14-k was low-getting more labs CC 03/19/14 Has been on kcl

## 2014-03-19 ENCOUNTER — Telehealth: Payer: Self-pay | Admitting: *Deleted

## 2014-03-19 ENCOUNTER — Ambulatory Visit
Admission: RE | Admit: 2014-03-19 | Discharge: 2014-03-19 | Disposition: A | Discharge status: Home / Self Care | Payer: Medicare Other | Source: Ambulatory Visit | Attending: General Surgery | Admitting: General Surgery

## 2014-03-19 ENCOUNTER — Other Ambulatory Visit (HOSPITAL_BASED_OUTPATIENT_CLINIC_OR_DEPARTMENT_OTHER): Payer: Medicare Other

## 2014-03-19 DIAGNOSIS — C50912 Malignant neoplasm of unspecified site of left female breast: Secondary | ICD-10-CM

## 2014-03-19 DIAGNOSIS — E876 Hypokalemia: Secondary | ICD-10-CM

## 2014-03-19 DIAGNOSIS — C50419 Malignant neoplasm of upper-outer quadrant of unspecified female breast: Secondary | ICD-10-CM

## 2014-03-19 LAB — BASIC METABOLIC PANEL (CC13)
Anion Gap: 9 mEq/L (ref 3–11)
BUN: 11.8 mg/dL (ref 7.0–26.0)
CO2: 26 meq/L (ref 22–29)
Calcium: 8.7 mg/dL (ref 8.4–10.4)
Chloride: 109 mEq/L (ref 98–109)
Creatinine: 0.7 mg/dL (ref 0.6–1.1)
GLUCOSE: 99 mg/dL (ref 70–140)
POTASSIUM: 2.8 meq/L — AB (ref 3.5–5.1)
SODIUM: 145 meq/L (ref 136–145)

## 2014-03-19 NOTE — Progress Notes (Signed)
Quick Note:  Please let patient know that her potassium is quite low. She will need 40 mEq of KCl BID. tx FB ______

## 2014-03-19 NOTE — Telephone Encounter (Signed)
Called pt to inform her of potassium level(2.8L).No answer. Will continue to call pt back until I reach her. Message to be forwarded to Charlestine Massed, NP

## 2014-03-19 NOTE — Telephone Encounter (Signed)
Unable to get in contact with pt about potassium level(2.8L). I will continue to try to reach pt until I can speak with her concerning this matter. Message to be forwarded to Charlestine Massed, NP.

## 2014-03-20 ENCOUNTER — Telehealth: Payer: Self-pay | Admitting: *Deleted

## 2014-03-20 ENCOUNTER — Encounter: Payer: Self-pay | Admitting: *Deleted

## 2014-03-20 NOTE — Telephone Encounter (Signed)
Attempted to try to reach pt again concerning Potassium level(2.8L). No answer. I will try to continue to reach pt until I can talk to her. The VM doesn't allow you to leave a message. Message to be forwarded to Charlestine Massed, NP.

## 2014-03-20 NOTE — Progress Notes (Signed)
Spoke to [place] (Dr. Marlowe Aschoff nurse) to inform of pt K level. Dr. Barry Dienes is aware and will assess needs for K replacement prior to surgery.

## 2014-03-21 ENCOUNTER — Encounter (HOSPITAL_BASED_OUTPATIENT_CLINIC_OR_DEPARTMENT_OTHER)
Admission: RE | Disposition: A | Discharge status: Home / Self Care | Payer: Self-pay | Source: Ambulatory Visit | Attending: General Surgery

## 2014-03-21 ENCOUNTER — Ambulatory Visit
Admission: RE | Admit: 2014-03-21 | Discharge: 2014-03-21 | Disposition: A | Discharge status: Home / Self Care | Payer: Medicare Other | Source: Ambulatory Visit | Attending: General Surgery | Admitting: General Surgery

## 2014-03-21 ENCOUNTER — Ambulatory Visit (HOSPITAL_BASED_OUTPATIENT_CLINIC_OR_DEPARTMENT_OTHER): Payer: Medicare Other | Admitting: Anesthesiology

## 2014-03-21 ENCOUNTER — Ambulatory Visit (HOSPITAL_BASED_OUTPATIENT_CLINIC_OR_DEPARTMENT_OTHER)
Admission: RE | Admit: 2014-03-21 | Discharge: 2014-03-21 | Disposition: A | Discharge status: Home / Self Care | Payer: Medicare Other | Source: Ambulatory Visit | Attending: General Surgery | Admitting: General Surgery

## 2014-03-21 ENCOUNTER — Encounter (HOSPITAL_BASED_OUTPATIENT_CLINIC_OR_DEPARTMENT_OTHER): Payer: Self-pay | Admitting: *Deleted

## 2014-03-21 ENCOUNTER — Encounter (HOSPITAL_COMMUNITY)
Admission: RE | Admit: 2014-03-21 | Discharge: 2014-03-21 | Disposition: A | Discharge status: Home / Self Care | Payer: Medicare Other | Source: Ambulatory Visit | Attending: General Surgery | Admitting: General Surgery

## 2014-03-21 ENCOUNTER — Encounter (HOSPITAL_BASED_OUTPATIENT_CLINIC_OR_DEPARTMENT_OTHER): Payer: Medicare Other | Admitting: Anesthesiology

## 2014-03-21 DIAGNOSIS — C50912 Malignant neoplasm of unspecified site of left female breast: Secondary | ICD-10-CM

## 2014-03-21 DIAGNOSIS — K219 Gastro-esophageal reflux disease without esophagitis: Secondary | ICD-10-CM | POA: Insufficient documentation

## 2014-03-21 DIAGNOSIS — Z79899 Other long term (current) drug therapy: Secondary | ICD-10-CM | POA: Insufficient documentation

## 2014-03-21 DIAGNOSIS — Z9221 Personal history of antineoplastic chemotherapy: Secondary | ICD-10-CM | POA: Diagnosis not present

## 2014-03-21 DIAGNOSIS — Z170 Estrogen receptor positive status [ER+]: Secondary | ICD-10-CM | POA: Diagnosis not present

## 2014-03-21 DIAGNOSIS — C50919 Malignant neoplasm of unspecified site of unspecified female breast: Secondary | ICD-10-CM | POA: Insufficient documentation

## 2014-03-21 DIAGNOSIS — Z882 Allergy status to sulfonamides status: Secondary | ICD-10-CM | POA: Diagnosis not present

## 2014-03-21 HISTORY — PX: PARTIAL MASTECTOMY WITH NEEDLE LOCALIZATION AND AXILLARY SENTINEL LYMPH NODE BX: SHX6009

## 2014-03-21 LAB — POCT I-STAT, CHEM 8
BUN: 8 mg/dL (ref 6–23)
CALCIUM ION: 1.04 mmol/L — AB (ref 1.13–1.30)
CREATININE: 0.6 mg/dL (ref 0.50–1.10)
Chloride: 108 mEq/L (ref 96–112)
GLUCOSE: 91 mg/dL (ref 70–99)
HEMATOCRIT: 29 % — AB (ref 36.0–46.0)
HEMOGLOBIN: 9.9 g/dL — AB (ref 12.0–15.0)
Potassium: 3.2 mEq/L — ABNORMAL LOW (ref 3.7–5.3)
Sodium: 143 mEq/L (ref 137–147)
TCO2: 23 mmol/L (ref 0–100)

## 2014-03-21 LAB — POCT HEMOGLOBIN-HEMACUE: Hemoglobin: 10.5 g/dL — ABNORMAL LOW (ref 12.0–15.0)

## 2014-03-21 SURGERY — RADIOACTIVE SEED GUIDED PARTIAL MASTECTOMY WITH AXILLARY SENTINEL LYMPH NODE BIOPSY
Anesthesia: General | Site: Breast | Laterality: Left

## 2014-03-21 MED ORDER — HYDROMORPHONE HCL PF 1 MG/ML IJ SOLN
0.2500 mg | INTRAMUSCULAR | Status: DC | PRN
  Administered 2014-03-21 (×3): 0.5 mg via INTRAVENOUS

## 2014-03-21 MED ORDER — OXYCODONE HCL 5 MG PO TABS: 5.0000 mg | ORAL_TABLET | Freq: Once | ORAL | Status: DC | PRN

## 2014-03-21 MED ORDER — SODIUM CHLORIDE 0.9 % IV SOLN: 250.0000 mL | INTRAVENOUS | Status: DC | PRN

## 2014-03-21 MED ORDER — FENTANYL CITRATE 0.05 MG/ML IJ SOLN
INTRAMUSCULAR | Status: AC
  Filled 2014-03-21: qty 6

## 2014-03-21 MED ORDER — SODIUM CHLORIDE 0.9 % IJ SOLN: 3.0000 mL | INTRAMUSCULAR | Status: DC | PRN

## 2014-03-21 MED ORDER — OXYCODONE HCL 5 MG PO TABS: 5.0000 mg | ORAL_TABLET | ORAL | Status: DC | PRN

## 2014-03-21 MED ORDER — FENTANYL CITRATE 0.05 MG/ML IJ SOLN
INTRAMUSCULAR | Status: AC
  Filled 2014-03-21: qty 2

## 2014-03-21 MED ORDER — LIDOCAINE-EPINEPHRINE 1 %-1:100000 IJ SOLN
INTRAMUSCULAR | Status: AC
  Filled 2014-03-21: qty 2

## 2014-03-21 MED ORDER — CEFAZOLIN SODIUM-DEXTROSE 2-3 GM-% IV SOLR
2.0000 g | INTRAVENOUS | Status: AC
  Administered 2014-03-21: 2 g via INTRAVENOUS

## 2014-03-21 MED ORDER — FENTANYL CITRATE 0.05 MG/ML IJ SOLN
50.0000 ug | INTRAMUSCULAR | Status: DC | PRN
  Administered 2014-03-21: 100 ug via INTRAVENOUS

## 2014-03-21 MED ORDER — HYDROMORPHONE HCL PF 1 MG/ML IJ SOLN
INTRAMUSCULAR | Status: AC
  Filled 2014-03-21: qty 1

## 2014-03-21 MED ORDER — LACTATED RINGERS IV SOLN
INTRAVENOUS | Status: DC
  Administered 2014-03-21 (×2): via INTRAVENOUS

## 2014-03-21 MED ORDER — SODIUM CHLORIDE 0.9 % IJ SOLN: 3.0000 mL | Freq: Two times a day (BID) | INTRAMUSCULAR | Status: DC

## 2014-03-21 MED ORDER — ACETAMINOPHEN 650 MG RE SUPP: 650.0000 mg | RECTAL | Status: DC | PRN

## 2014-03-21 MED ORDER — BUPIVACAINE HCL (PF) 0.25 % IJ SOLN
INTRAMUSCULAR | Status: AC
  Filled 2014-03-21: qty 60

## 2014-03-21 MED ORDER — LIDOCAINE-EPINEPHRINE (PF) 1 %-1:200000 IJ SOLN
INTRAMUSCULAR | Status: AC
  Filled 2014-03-21: qty 20

## 2014-03-21 MED ORDER — PROMETHAZINE HCL 25 MG/ML IJ SOLN
6.2500 mg | INTRAMUSCULAR | Status: DC | PRN
  Administered 2014-03-21: 6.25 mg via INTRAVENOUS

## 2014-03-21 MED ORDER — TECHNETIUM TC 99M SULFUR COLLOID FILTERED
1.0000 | Freq: Once | INTRAVENOUS | Status: AC | PRN
  Administered 2014-03-21: 1 via INTRADERMAL

## 2014-03-21 MED ORDER — CEFAZOLIN SODIUM-DEXTROSE 2-3 GM-% IV SOLR
INTRAVENOUS | Status: AC
  Filled 2014-03-21: qty 50

## 2014-03-21 MED ORDER — ONDANSETRON HCL 4 MG/2ML IJ SOLN: 4.0000 mg | Freq: Once | INTRAMUSCULAR | Status: DC | PRN

## 2014-03-21 MED ORDER — PHENYLEPHRINE HCL 10 MG/ML IJ SOLN
INTRAMUSCULAR | Status: DC | PRN
  Administered 2014-03-21: 40 ug via INTRAVENOUS

## 2014-03-21 MED ORDER — SODIUM CHLORIDE 0.9 % IJ SOLN
INTRAMUSCULAR | Status: AC
  Filled 2014-03-21: qty 10

## 2014-03-21 MED ORDER — MIDAZOLAM HCL 2 MG/2ML IJ SOLN
INTRAMUSCULAR | Status: AC
  Filled 2014-03-21: qty 2

## 2014-03-21 MED ORDER — FENTANYL CITRATE 0.05 MG/ML IJ SOLN
INTRAMUSCULAR | Status: DC | PRN
  Administered 2014-03-21: 50 ug via INTRAVENOUS
  Administered 2014-03-21: 25 ug via INTRAVENOUS
  Administered 2014-03-21: 50 ug via INTRAVENOUS

## 2014-03-21 MED ORDER — PROMETHAZINE HCL 25 MG/ML IJ SOLN
INTRAMUSCULAR | Status: AC
  Filled 2014-03-21: qty 1

## 2014-03-21 MED ORDER — SCOPOLAMINE 1 MG/3DAYS TD PT72
MEDICATED_PATCH | TRANSDERMAL | Status: AC
  Filled 2014-03-21: qty 1

## 2014-03-21 MED ORDER — DEXAMETHASONE SODIUM PHOSPHATE 4 MG/ML IJ SOLN
INTRAMUSCULAR | Status: DC | PRN
  Administered 2014-03-21: 10 mg via INTRAVENOUS

## 2014-03-21 MED ORDER — EPHEDRINE SULFATE 50 MG/ML IJ SOLN
INTRAMUSCULAR | Status: DC | PRN
  Administered 2014-03-21: 10 mg via INTRAVENOUS
  Administered 2014-03-21 (×2): 5 mg via INTRAVENOUS

## 2014-03-21 MED ORDER — BUPIVACAINE-EPINEPHRINE (PF) 0.25% -1:200000 IJ SOLN
INTRAMUSCULAR | Status: AC
  Filled 2014-03-21: qty 60

## 2014-03-21 MED ORDER — MIDAZOLAM HCL 5 MG/5ML IJ SOLN
INTRAMUSCULAR | Status: DC | PRN
  Administered 2014-03-21: 2 mg via INTRAVENOUS

## 2014-03-21 MED ORDER — SCOPOLAMINE 1 MG/3DAYS TD PT72
1.0000 | MEDICATED_PATCH | TRANSDERMAL | Status: DC
  Administered 2014-03-21: 1 via TRANSDERMAL
  Administered 2014-03-21: 1.5 mg via TRANSDERMAL

## 2014-03-21 MED ORDER — METHYLENE BLUE 1 % INJ SOLN
INTRAMUSCULAR | Status: AC
  Filled 2014-03-21: qty 10

## 2014-03-21 MED ORDER — ONDANSETRON HCL 4 MG/2ML IJ SOLN
INTRAMUSCULAR | Status: DC | PRN
  Administered 2014-03-21: 4 mg via INTRAVENOUS

## 2014-03-21 MED ORDER — BUPIVACAINE-EPINEPHRINE 0.25% -1:200000 IJ SOLN
INTRAMUSCULAR | Status: DC | PRN
  Administered 2014-03-21: 10 mL

## 2014-03-21 MED ORDER — MIDAZOLAM HCL 2 MG/2ML IJ SOLN
1.0000 mg | INTRAMUSCULAR | Status: DC | PRN
  Administered 2014-03-21: 2 mg via INTRAVENOUS

## 2014-03-21 MED ORDER — OXYCODONE HCL 5 MG/5ML PO SOLN: 5.0000 mg | Freq: Once | ORAL | Status: DC | PRN

## 2014-03-21 MED ORDER — OXYCODONE-ACETAMINOPHEN 5-325 MG PO TABS: 1.0000 | ORAL_TABLET | ORAL | Status: DC | PRN

## 2014-03-21 MED ORDER — ACETAMINOPHEN 325 MG PO TABS: 650.0000 mg | ORAL_TABLET | ORAL | Status: DC | PRN

## 2014-03-21 MED ORDER — LIDOCAINE HCL (CARDIAC) 20 MG/ML IV SOLN
INTRAVENOUS | Status: DC | PRN
  Administered 2014-03-21: 50 mg via INTRAVENOUS

## 2014-03-21 SURGICAL SUPPLY — 65 items
ADH SKN CLS APL DERMABOND .7 (GAUZE/BANDAGES/DRESSINGS) ×1
APPLIER CLIP 9.375 MED OPEN (MISCELLANEOUS)
APR CLP MED 9.3 20 MLT OPN (MISCELLANEOUS)
BINDER BREAST LRG (GAUZE/BANDAGES/DRESSINGS) IMPLANT
BINDER BREAST MEDIUM (GAUZE/BANDAGES/DRESSINGS) IMPLANT
BINDER BREAST XLRG (GAUZE/BANDAGES/DRESSINGS) IMPLANT
BINDER BREAST XXLRG (GAUZE/BANDAGES/DRESSINGS) ×1 IMPLANT
BLADE HEX COATED 2.75 (ELECTRODE) ×2 IMPLANT
BLADE SURG 10 STRL SS (BLADE) ×2 IMPLANT
BLADE SURG 15 STRL LF DISP TIS (BLADE) ×1 IMPLANT
BLADE SURG 15 STRL SS (BLADE) ×2
BNDG COHESIVE 4X5 TAN STRL (GAUZE/BANDAGES/DRESSINGS) ×2 IMPLANT
CANISTER SUC SOCK COL 7IN (MISCELLANEOUS) IMPLANT
CANISTER SUCT 1200ML W/VALVE (MISCELLANEOUS) ×1 IMPLANT
CHLORAPREP W/TINT 26ML (MISCELLANEOUS) ×2 IMPLANT
CLIP APPLIE 9.375 MED OPEN (MISCELLANEOUS) IMPLANT
CLIP TI LARGE 6 (CLIP) ×3 IMPLANT
CLIP TI MEDIUM 6 (CLIP) ×5 IMPLANT
CLIP TI WIDE RED SMALL 6 (CLIP) ×2 IMPLANT
COVER MAYO STAND STRL (DRAPES) ×2 IMPLANT
COVER PROBE W GEL 5X96 (DRAPES) ×2 IMPLANT
DECANTER SPIKE VIAL GLASS SM (MISCELLANEOUS) IMPLANT
DERMABOND ADVANCED (GAUZE/BANDAGES/DRESSINGS) ×1
DERMABOND ADVANCED .7 DNX12 (GAUZE/BANDAGES/DRESSINGS) ×1 IMPLANT
DEVICE DUBIN W/COMP PLATE 8390 (MISCELLANEOUS) ×2 IMPLANT
DRAPE UTILITY XL STRL (DRAPES) ×2 IMPLANT
ELECT REM PT RETURN 9FT ADLT (ELECTROSURGICAL) ×2
ELECTRODE REM PT RTRN 9FT ADLT (ELECTROSURGICAL) ×1 IMPLANT
GLOVE BIO SURGEON STRL SZ 6 (GLOVE) ×2 IMPLANT
GLOVE BIOGEL PI IND STRL 6.5 (GLOVE) ×1 IMPLANT
GLOVE BIOGEL PI IND STRL 7.0 (GLOVE) ×1 IMPLANT
GLOVE BIOGEL PI INDICATOR 6.5 (GLOVE) ×1
GLOVE BIOGEL PI INDICATOR 7.0 (GLOVE) ×1
GLOVE ECLIPSE 7.0 STRL STRAW (GLOVE) ×1 IMPLANT
GLOVE EXAM NITRILE MD LF STRL (GLOVE) ×2 IMPLANT
GOWN STRL REUS W/ TWL LRG LVL3 (GOWN DISPOSABLE) ×1 IMPLANT
GOWN STRL REUS W/TWL 2XL LVL3 (GOWN DISPOSABLE) ×2 IMPLANT
GOWN STRL REUS W/TWL LRG LVL3 (GOWN DISPOSABLE) ×2
KIT MARKER MARGIN INK (KITS) ×2 IMPLANT
NDL HYPO 25X1 1.5 SAFETY (NEEDLE) ×1 IMPLANT
NEEDLE HYPO 25X1 1.5 SAFETY (NEEDLE) ×2 IMPLANT
NS IRRIG 1000ML POUR BTL (IV SOLUTION) ×1 IMPLANT
PACK BASIN DAY SURGERY FS (CUSTOM PROCEDURE TRAY) ×2 IMPLANT
PACK UNIVERSAL I (CUSTOM PROCEDURE TRAY) ×2 IMPLANT
PENCIL BUTTON HOLSTER BLD 10FT (ELECTRODE) ×2 IMPLANT
SHEET MEDIUM DRAPE 40X70 STRL (DRAPES) IMPLANT
SLEEVE SCD COMPRESS KNEE MED (MISCELLANEOUS) ×2 IMPLANT
SPONGE GAUZE 4X4 12PLY STER LF (GAUZE/BANDAGES/DRESSINGS) ×2 IMPLANT
SPONGE LAP 18X18 X RAY DECT (DISPOSABLE) ×2 IMPLANT
STOCKINETTE IMPERVIOUS LG (DRAPES) ×2 IMPLANT
STRIP CLOSURE SKIN 1/2X4 (GAUZE/BANDAGES/DRESSINGS) ×2 IMPLANT
SUT ETHILON 2 0 FS 18 (SUTURE) IMPLANT
SUT MNCRL AB 4-0 PS2 18 (SUTURE) ×2 IMPLANT
SUT MON AB 5-0 PS2 18 (SUTURE) IMPLANT
SUT SILK 2 0 SH (SUTURE) IMPLANT
SUT VIC AB 2-0 SH 27 (SUTURE) ×2
SUT VIC AB 2-0 SH 27XBRD (SUTURE) ×1 IMPLANT
SUT VIC AB 3-0 SH 27 (SUTURE) ×4
SUT VIC AB 3-0 SH 27X BRD (SUTURE) ×2 IMPLANT
SUT VIC AB 5-0 PS2 18 (SUTURE) IMPLANT
SYR CONTROL 10ML LL (SYRINGE) ×2 IMPLANT
TOWEL OR 17X24 6PK STRL BLUE (TOWEL DISPOSABLE) ×2 IMPLANT
TOWEL OR NON WOVEN STRL DISP B (DISPOSABLE) ×2 IMPLANT
TUBE CONNECTING 20X1/4 (TUBING) ×2 IMPLANT
YANKAUER SUCT BULB TIP NO VENT (SUCTIONS) ×1 IMPLANT

## 2014-03-21 NOTE — Progress Notes (Signed)
Assisted Dr. Crews with left, ultrasound guided, pectoralis block. Side rails up, monitors on throughout procedure. See vital signs in flow sheet. Tolerated Procedure well. 

## 2014-03-21 NOTE — Anesthesia Procedure Notes (Signed)
Procedure Name: LMA Insertion Date/Time: 03/21/2014 8:53 AM Performed by: Melynda Ripple D Pre-anesthesia Checklist: Patient identified, Emergency Drugs available, Suction available and Patient being monitored Patient Re-evaluated:Patient Re-evaluated prior to inductionOxygen Delivery Method: Circle System Utilized Preoxygenation: Pre-oxygenation with 100% oxygen Intubation Type: IV induction Ventilation: Mask ventilation without difficulty LMA: LMA inserted LMA Size: 4.0 Number of attempts: 1 Airway Equipment and Method: bite block Placement Confirmation: positive ETCO2 Tube secured with: Tape Dental Injury: Teeth and Oropharynx as per pre-operative assessment

## 2014-03-21 NOTE — Anesthesia Preprocedure Evaluation (Signed)
Anesthesia Evaluation  Patient identified by MRN, date of birth, ID band Patient awake    Reviewed: Allergy & Precautions, H&P , NPO status , Patient's Chart, lab work & pertinent test results  Airway Mallampati: I TM Distance: >3 FB Neck ROM: Full    Dental  (+) Teeth Intact, Dental Advisory Given   Pulmonary  breath sounds clear to auscultation        Cardiovascular Rhythm:Regular Rate:Normal     Neuro/Psych    GI/Hepatic GERD-  Medicated and Controlled,  Endo/Other    Renal/GU      Musculoskeletal   Abdominal   Peds  Hematology   Anesthesia Other Findings   Reproductive/Obstetrics                           Anesthesia Physical Anesthesia Plan  ASA: III  Anesthesia Plan: General   Post-op Pain Management: MAC Combined w/ Regional for Post-op pain   Induction: Intravenous  Airway Management Planned: LMA  Additional Equipment:   Intra-op Plan:   Post-operative Plan: Extubation in OR  Informed Consent: I have reviewed the patients History and Physical, chart, labs and discussed the procedure including the risks, benefits and alternatives for the proposed anesthesia with the patient or authorized representative who has indicated his/her understanding and acceptance.   Dental advisory given  Plan Discussed with: CRNA, Anesthesiologist and Surgeon  Anesthesia Plan Comments:         Anesthesia Quick Evaluation

## 2014-03-21 NOTE — Op Note (Signed)
Left Breast Seed Localized Lumpectomy with Sentinel Node Biopsy   Indications: This patient presents with history of left breast cancer with clinically negative axillary lymph node exam.  Pre-operative Diagnosis: left breast cancer, cT2N0M0, s/p neoadjuvant chemotherapy  Post-operative Diagnosis: left breast cancer  Surgeon: Stark Klein   Anesthesia: General endotracheal anesthesia  ASA Class: 3  Procedure Details  The patient was seen in the Holding Room. The risks, benefits, complications, treatment options, and expected outcomes were discussed with the patient. The possibilities of bleeding, infection, the need for additional procedures, failure to diagnose a condition, and creating a complication requiring transfusion or operation were discussed with the patient. The patient concurred with the proposed plan, giving informed consent.  The site of surgery properly noted/marked. The seed was confirmed in the holding area with the neoprobe in I125 mode. The patient was taken to Operating Room # 5, identified as Cherye Gaertner and the procedure verified as Left Breast Lumpectomy and Sentinel Node Biopsy. A Time Out was held and the above information confirmed.  The left arm, breast, and bilateral chest were prepped and draped in standard fashion.      The lumpectomy was performed by creating an transverse incision over the upper inner quadrant of the breast around the previously placed localization seed.  Dissection was carried down to the pectoral fascia. The cautery was used to do the lumpectomy.  The pectoralis fascia was taken posteriorly.   The edges of the cavity were marked with large clips, with one each medial, lateral, inferior and superior, and two clips posteriorly.   The specimen was inked with the margin marker paint kit.    Specimen radiography confirmed inclusion of the mammographic lesion and the seed.  Hemostasis was achieved with cautery.  The wound was irrigated and closed with  3-0 vicryl in layers and 4-0 monocryl subcuticular suture.    Using a hand-held gamma probe, L axillary sentinel nodes were identified transcutaneously.  An oblique incision was created below the axillary hairline.  Dissection was carried through the clavipectoral fascia.  4 axillary sentinel nodes were removed.  Counts per second were 1500, 140, 160, and 315.  The background count was 0 cps.    The axillary incision was closed with a 3-0 vicryl deep dermal interrupted sutures and a 4-0 monocryl subcuticular closure.      Sterile dressings were applied. At the end of the operation, all sponge, instrument, and needle counts were correct.  Findings: grossly clear surgical margins and no adenopathy, #1 1500 cps, #2 140 cps, #3 160 cps, #4 315 cps.    Estimated Blood Loss:  less than 50 mL         Specimens: L breast lumpectomy and 4 axillary sentinel nodes.           Complications:  None; patient tolerated the procedure well.         Disposition: PACU - hemodynamically stable.         Condition: stable

## 2014-03-21 NOTE — Transfer of Care (Signed)
Immediate Anesthesia Transfer of Care Note  Patient: April Walker  Procedure(s) Performed: Procedure(s): LEFT SEED LOCALIZATION LUMPECTOMY WITH  SENTINEL LYMPH NODE BIOPSY (Left)  Patient Location: PACU  Anesthesia Type:General  Level of Consciousness: sedated  Airway & Oxygen Therapy: Patient Spontanous Breathing and Patient connected to face mask oxygen  Post-op Assessment: Report given to PACU RN and Post -op Vital signs reviewed and stable  Post vital signs: Reviewed and stable  Complications: No apparent anesthesia complications

## 2014-03-21 NOTE — Progress Notes (Signed)
Emotional support during breast injections °

## 2014-03-21 NOTE — Interval H&P Note (Signed)
History and Physical Interval Note:  03/21/2014 8:22 AM  April Walker  has presented today for surgery, with the diagnosis of left breast cancer  The various methods of treatment have been discussed with the patient and family. After consideration of risks, benefits and other options for treatment, the patient has consented to  Procedure(s): LEFT SEED LOCALIZATION LUMPECTOMY WITH  SENTINEL LYMPH NODE BIOPSY (Left) as a surgical intervention .  The patient's history has been reviewed, patient examined, no change in status, stable for surgery.  I have reviewed the patient's chart and labs.  Questions were answered to the patient's satisfaction.     [name]

## 2014-03-21 NOTE — Discharge Instructions (Addendum)
[facility] °Office Phone Number [phone] ° °BREAST BIOPSY/ PARTIAL MASTECTOMY: POST OP INSTRUCTIONS ° °Always review your discharge instruction sheet given to you by the facility where your surgery was performed. ° °IF YOU HAVE DISABILITY OR FAMILY LEAVE FORMS, YOU MUST BRING THEM TO THE OFFICE FOR PROCESSING.  DO NOT GIVE THEM TO YOUR DOCTOR. ° °1. A prescription for pain medication may be given to you upon discharge.  Take your pain medication as prescribed, if needed.  If narcotic pain medicine is not needed, then you may take acetaminophen (Tylenol) or ibuprofen (Advil) as needed. °2. Take your usually prescribed medications unless otherwise directed °3. If you need a refill on your pain medication, please contact your pharmacy.  They will contact our office to request authorization.  Prescriptions will not be filled after 5pm or on week-ends. °4. You should eat very light the first 24 hours after surgery, such as soup, crackers, pudding, etc.  Resume your normal diet the day after surgery. °5. Most patients will experience some swelling and bruising in the breast.  Ice packs and a good support bra will help.  Swelling and bruising can take several days to resolve.  °6. It is common to experience some constipation if taking pain medication after surgery.  Increasing fluid intake and taking a stool softener will usually help or prevent this problem from occurring.  A mild laxative (Milk of Magnesia or Miralax) should be taken according to package directions if there are no bowel movements after 48 hours. °7. Unless discharge instructions indicate otherwise, you may remove your bandages 48 hours after surgery, and you may shower at that time.  You may have steri-strips (small skin tapes) in place directly over the incision.  These strips should be left on the skin for 7-10 days.   Any sutures or staples will be removed at the office during your follow-up visit. °8. ACTIVITIES:  You may resume  regular daily activities (gradually increasing) beginning the next day.  Wearing a good support bra or sports bra (or the breast binder) minimizes pain and swelling.  You may have sexual intercourse when it is comfortable. °a. You may drive when you no longer are taking prescription pain medication, you can comfortably wear a seatbelt, and you can safely maneuver your car and apply brakes. °b. RETURN TO WORK:  __________1 week_______________ °9. You should see your doctor in the office for a follow-up appointment approximately two weeks after your surgery.  Your doctor’s nurse will typically make your follow-up appointment when she calls you with your pathology report.  Expect your pathology report 2-3 business days after your surgery.  You may call to check if you do not hear from us after three days. ° ° °WHEN TO CALL YOUR DOCTOR: °1. Fever over 101.0 °2. Nausea and/or vomiting. °3. Extreme swelling or bruising. °4. Continued bleeding from incision. °5. Increased pain, redness, or drainage from the incision. ° °The clinic staff is available to answer your questions during regular business hours.  Please don’t hesitate to call and ask to speak to one of the nurses for clinical concerns.  If you have a medical emergency, go to the nearest emergency room or call 911.  A surgeon from [facility] is always on call at the hospital. ° °For further questions, please visit [email or web address]  ° ° °Post Anesthesia Home Care Instructions ° °Activity: °Get plenty of rest for the remainder of the day. A responsible adult should stay with you for 24   hours following the procedure.  °For the next 24 hours, DO NOT: °-Drive a car °-Operate machinery °-Drink alcoholic beverages °-Take any medication unless instructed by your physician °-Make any legal decisions or sign important papers. ° °Meals: °Start with liquid foods such as gelatin or soup. Progress to regular foods as tolerated. Avoid greasy, spicy, heavy  foods. If nausea and/or vomiting occur, drink only clear liquids until the nausea and/or vomiting subsides. Call your physician if vomiting continues. ° °Special Instructions/Symptoms: °Your throat may feel dry or sore from the anesthesia or the breathing tube placed in your throat during surgery. If this causes discomfort, gargle with warm salt water. The discomfort should disappear within 24 hours. ° ° °

## 2014-03-21 NOTE — Anesthesia Postprocedure Evaluation (Signed)
  Anesthesia Post-op Note  Patient: April Walker  Procedure(s) Performed: Procedure(s): LEFT SEED LOCALIZATION LUMPECTOMY WITH  SENTINEL LYMPH NODE BIOPSY (Left)  Patient Location: PACU  Anesthesia Type: General   Level of Consciousness: awake, alert  and oriented  Airway and Oxygen Therapy: Patient Spontanous Breathing  Post-op Pain: mild  Post-op Assessment: Post-op Vital signs reviewed  Post-op Vital Signs: Reviewed  Last Vitals:  Filed Vitals:   03/21/14 1045  BP: 155/79  Pulse: 87  Temp:   Resp: 12    Complications: No apparent anesthesia complications

## 2014-03-23 ENCOUNTER — Telehealth (INDEPENDENT_AMBULATORY_CARE_PROVIDER_SITE_OTHER): Payer: Self-pay | Admitting: General Surgery

## 2014-03-23 NOTE — Telephone Encounter (Signed)
Left message on machine about good path report.

## 2014-03-26 ENCOUNTER — Telehealth (INDEPENDENT_AMBULATORY_CARE_PROVIDER_SITE_OTHER): Payer: Self-pay

## 2014-03-26 ENCOUNTER — Ambulatory Visit (HOSPITAL_BASED_OUTPATIENT_CLINIC_OR_DEPARTMENT_OTHER): Payer: Medicare Other

## 2014-03-26 ENCOUNTER — Telehealth: Payer: Self-pay | Admitting: *Deleted

## 2014-03-26 ENCOUNTER — Other Ambulatory Visit (HOSPITAL_BASED_OUTPATIENT_CLINIC_OR_DEPARTMENT_OTHER): Payer: Medicare Other

## 2014-03-26 ENCOUNTER — Ambulatory Visit (HOSPITAL_BASED_OUTPATIENT_CLINIC_OR_DEPARTMENT_OTHER): Payer: Medicare Other | Admitting: Adult Health

## 2014-03-26 ENCOUNTER — Encounter: Payer: Self-pay | Admitting: Adult Health

## 2014-03-26 VITALS — BP 139/70 | HR 84 | Temp 98.5°F | Resp 18 | Ht 68.0 in | Wt 222.7 lb

## 2014-03-26 DIAGNOSIS — C50419 Malignant neoplasm of upper-outer quadrant of unspecified female breast: Secondary | ICD-10-CM

## 2014-03-26 DIAGNOSIS — C50412 Malignant neoplasm of upper-outer quadrant of left female breast: Secondary | ICD-10-CM

## 2014-03-26 DIAGNOSIS — Z5112 Encounter for antineoplastic immunotherapy: Secondary | ICD-10-CM

## 2014-03-26 DIAGNOSIS — Z170 Estrogen receptor positive status [ER+]: Secondary | ICD-10-CM

## 2014-03-26 DIAGNOSIS — Z452 Encounter for adjustment and management of vascular access device: Secondary | ICD-10-CM

## 2014-03-26 LAB — CBC WITH DIFFERENTIAL/PLATELET
BASO%: 0.6 % (ref 0.0–2.0)
Basophils Absolute: 0 10*3/uL (ref 0.0–0.1)
EOS%: 0.3 % (ref 0.0–7.0)
Eosinophils Absolute: 0 10*3/uL (ref 0.0–0.5)
HCT: 30.9 % — ABNORMAL LOW (ref 34.8–46.6)
HGB: 10 g/dL — ABNORMAL LOW (ref 11.6–15.9)
LYMPH%: 22.2 % (ref 14.0–49.7)
MCH: 29.8 pg (ref 25.1–34.0)
MCHC: 32.2 g/dL (ref 31.5–36.0)
MCV: 92.4 fL (ref 79.5–101.0)
MONO#: 0.5 10*3/uL (ref 0.1–0.9)
MONO%: 7.3 % (ref 0.0–14.0)
NEUT#: 4.3 10*3/uL (ref 1.5–6.5)
NEUT%: 69.6 % (ref 38.4–76.8)
Platelets: 199 10*3/uL (ref 145–400)
RBC: 3.35 10*6/uL — AB (ref 3.70–5.45)
RDW: 16.4 % — ABNORMAL HIGH (ref 11.2–14.5)
WBC: 6.2 10*3/uL (ref 3.9–10.3)
lymph#: 1.4 10*3/uL (ref 0.9–3.3)

## 2014-03-26 LAB — COMPREHENSIVE METABOLIC PANEL (CC13)
ALK PHOS: 86 U/L (ref 40–150)
ALT: 17 U/L (ref 0–55)
AST: 18 U/L (ref 5–34)
Albumin: 3.3 g/dL — ABNORMAL LOW (ref 3.5–5.0)
Anion Gap: 10 mEq/L (ref 3–11)
BILIRUBIN TOTAL: 0.57 mg/dL (ref 0.20–1.20)
BUN: 10 mg/dL (ref 7.0–26.0)
CO2: 26 mEq/L (ref 22–29)
Calcium: 9.1 mg/dL (ref 8.4–10.4)
Chloride: 109 mEq/L (ref 98–109)
Creatinine: 0.7 mg/dL (ref 0.6–1.1)
Glucose: 95 mg/dl (ref 70–140)
Potassium: 3.5 mEq/L (ref 3.5–5.1)
SODIUM: 145 meq/L (ref 136–145)
TOTAL PROTEIN: 6 g/dL — AB (ref 6.4–8.3)

## 2014-03-26 MED ORDER — DIPHENHYDRAMINE HCL 25 MG PO CAPS
ORAL_CAPSULE | ORAL | Status: AC
  Filled 2014-03-26: qty 1

## 2014-03-26 MED ORDER — SODIUM CHLORIDE 0.9 % IV SOLN
Freq: Once | INTRAVENOUS | Status: AC
  Administered 2014-03-26: 15:00:00 via INTRAVENOUS

## 2014-03-26 MED ORDER — SODIUM CHLORIDE 0.9 % IJ SOLN
10.0000 mL | INTRAMUSCULAR | Status: DC | PRN
  Administered 2014-03-26: 10 mL
  Filled 2014-03-26: qty 10

## 2014-03-26 MED ORDER — ACETAMINOPHEN 325 MG PO TABS
ORAL_TABLET | ORAL | Status: AC
  Filled 2014-03-26: qty 2

## 2014-03-26 MED ORDER — DIPHENHYDRAMINE HCL 25 MG PO CAPS
25.0000 mg | ORAL_CAPSULE | Freq: Once | ORAL | Status: AC
  Administered 2014-03-26: 25 mg via ORAL

## 2014-03-26 MED ORDER — ALTEPLASE 2 MG IJ SOLR
2.0000 mg | Freq: Once | INTRAMUSCULAR | Status: AC | PRN
  Administered 2014-03-26: 2 mg
  Filled 2014-03-26: qty 2

## 2014-03-26 MED ORDER — ACETAMINOPHEN 325 MG PO TABS
650.0000 mg | ORAL_TABLET | Freq: Once | ORAL | Status: AC
  Administered 2014-03-26: 650 mg via ORAL

## 2014-03-26 MED ORDER — TRASTUZUMAB CHEMO INJECTION 440 MG
6.0000 mg/kg | Freq: Once | INTRAVENOUS | Status: AC
  Administered 2014-03-26: 609 mg via INTRAVENOUS
  Filled 2014-03-26: qty 29

## 2014-03-26 MED ORDER — HEPARIN SOD (PORK) LOCK FLUSH 100 UNIT/ML IV SOLN
500.0000 [IU] | Freq: Once | INTRAVENOUS | Status: AC | PRN
  Administered 2014-03-26: 500 [IU]
  Filled 2014-03-26: qty 5

## 2014-03-26 NOTE — Telephone Encounter (Signed)
LMOV to call office.  Pathology shows all cancer disappeared with chemo and all lymph nodes were negative.

## 2014-03-26 NOTE — Progress Notes (Addendum)
[place]  [phone] Fax:([phone]) [phone]     ID: April Walker OB: [birth year]  MR#: [id number]  HYI#:[id number]  PCP: Criselda Peaches, MD GYN:  Vania Rea SU: Stark Klein  OTHER MD: [name], Silvano Rusk, Arther Abbott  CHIEF COMPLAINT: Left Breast Cancer, locally advanced TREATMENT: adjuvant trastuzumab, referral to radiation oncology    BREAST CANCER HISTORY: From the original integument:  Letti had routine screening mammography at [facility] 09/14/2013. This showed a possible mass in the left breast. On 10/11/2013 she underwent left diagnostic mammography and ultrasonography which confirmed an irregular mass in the upper outer left breast measuring up to 3 cm. This was firm and fixed at the 2:30 o'clock position, 6 cm from the nipple. Ultrasound showed an irregular hypoechoic mass measuring 2.8 cm. The left axilla showed no evidence of adenopathy.  On 10/11/2013 the patient underwent left breast biopsy, with the pathology [facility] [id number]) showing an invasive ductal carcinoma, high-grade, estrogen receptor 100% positive, progesterone receptor 43% positive, both with strong staining intensity, with an MIB-1 of 87% and HER-2 amplified, the signals ratio being 2.72, the number per cell 3.40.  On 10/23/2013 the patient underwent bilateral breast MRI. This showed the breast and position to be density be. In the left breast there was a 3 cm irregular enhancing mass in the upper outer quadrant. There was a 9 mm oval mass in the inner lower left breast with no definite fatty hilum. There was a 1.4 cm upper left axillary lymph node with a thickened cortex. There were no other abnormal appearing lymph nodes. Ultrasound of the left breast 10/30/2013 found of the left axillary lymph node with the mildly asymmetrical cortex, and a 7 mm her mass in the 7:30 o'clock position of the left breast. Biopsy of both these suspicious areas was performed 10/30/2013,  and the preliminary report is that they're both benign.  The patient's subsequent history is as detailed below  INTERVAL HISTORY: Cailin returns today for followup of her locally advanced left breast carcinoma. She is here for evaluation after surgery.  Her pathology results demonstrate no residual carcinoma.  She is elated.  She denies any fevers, chills, nausea, vomiting, constipation, diarrhea, numbness/tingling, vomiting, or any further concerns.     REVIEW OF SYSTEMS: A 10 point review of systems was conducted and is otherwise negative except for what is noted above.     PAST MEDICAL HISTORY: Past Medical History  Diagnosis Date  . Abnormal cholesterol test   . Hyperlipidemia   . Knee pain, left   . GERD (gastroesophageal reflux disease)   . Arthritis   . Tubulovillous adenoma of colon   . Diverticulosis   . Internal hemorrhoids   . Wears glasses   . Hearing deficit     no hearing aids    PAST SURGICAL HISTORY: Past Surgical History  Procedure Laterality Date  . Tonsillectomy and adenoidectomy    . Right knee arthroscopy  2011 Dr. Aline Brochure  . Treatment fistula anal    . Upper gastrointestinal endoscopy    . Hemorrhoid surgery    . Colonoscopy  2002    hemorrhoids  . Colonoscopy w/ polypectomy  12/09/2010    1 cm sigmoid polyp - , diverticulosis, hemorrhoids  . Knee surgery    . Knee surgery  2012    Left knee arthroscopy  . Total knee arthroplasty  11/23/2011    Procedure: TOTAL KNEE ARTHROPLASTY;  Surgeon: Carole Civil, MD;  Location: [facility];  Service: Orthopedics;  Laterality: Left;  . Portacath placement Left 11/17/2013    Procedure: INSERTION PORT-A-CATH;  Surgeon: Stark Klein, MD;  Location: [facility];  Service: General;  Laterality: Left;    FAMILY HISTORY Family History  Problem Relation Age of Onset  . Adopted: Yes  . Stroke Mother   . Heart attack Father    the patient is adopted and has no information on her biological family.  the patient's adoptive father died at the age of 84 from a myocardial infarction; productive mother died from a stroke at age 40. The patient does not know of any siblings  GYNECOLOGIC HISTORY: (Reviewed 12/18/2013) Menarche age 55. The patient is GX P0. She underwent menopause in her early 75s and took hormone replacement until 2010.  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 She is single, and lives at home with her black lab, [name], and her cats Patches and [name]    ADVANCED DIRECTIVES: Not in place   HEALTH MAINTENANCE:  (Reviewed 12/18/2013) History  Substance Use Topics  . Smoking status: Never Smoker   . Smokeless tobacco: Never Used  . Alcohol Use: Yes     Comment: socially     Colonoscopy: 2012/[name]  PAP: 2014/[name]  Bone density: not on file  Lipid panel: not on file   Allergies  Allergen Reactions  . Sulfonamide Derivatives Anaphylaxis and Hives    Pt. Stated, "large welps on neck immediately."  . Triprolidine-Pseudoephedrine Other (See Comments)    This is Actifed. Increased heart rate to tachycardia.    Current Outpatient Prescriptions  Medication Sig Dispense Refill  . loratadine (CLARITIN) 10 MG tablet Take 10 mg by mouth daily as needed. For allergies      . omeprazole (PRILOSEC) 20 MG capsule Take 20 mg by mouth as needed.      Marland Kitchen oxyCODONE-acetaminophen (ROXICET) 5-325 MG per tablet Take 1-2 tablets by mouth every 4 (four) hours as needed for severe pain.  30 tablet  0  . simvastatin (ZOCOR) 10 MG tablet Take 10 mg by mouth daily. Pt TAKES AT BEDTIME      . Acetaminophen (TYLENOL EXTRA STRENGTH PO) Take by mouth.      Marland Kitchen acyclovir (ZOVIRAX) 400 MG tablet Take 1 tablet (400 mg total) by mouth 2 (two) times daily.  60 tablet  2  . hydrocortisone (ANUSOL-HC) 25 MG suppository Place 1 suppository (25 mg total) rectally at bedtime as needed for hemorrhoids or itching.  10  suppository  0  . potassium chloride (K-DUR) 10 MEQ tablet Take 2 tablets (20 mEq total) by mouth 2 (two) times daily.  28 tablet  0   No current facility-administered medications for this visit.    OBJECTIVE: Middle-aged white woman in no acute distress Filed Vitals:   03/26/14 1151  BP: 139/70  Pulse: 84  Temp: 98.5 F (36.9 C)  Resp: 18     Body mass index is 33.87 kg/(m^2).    ECOG FS:1 - Symptomatic but completely ambulatory Filed Weights   03/26/14 1151  Weight: 222 lb 11.2 oz (101.016 kg)   GENERAL: Patient is a well appearing female in no acute distress HEENT:  Sclerae anicteric.  Oropharynx clear and moist. No ulcerations or evidence of oropharyngeal candidiasis. Neck is supple.  NODES:  No cervical, supraclavicular, or axillary lymphadenopathy palpated.  BREAST EXAM:  Deferred. LUNGS:  Clear to auscultation bilaterally.  No wheezes or rhonchi. HEART:  Regular rate and rhythm. No murmur appreciated. ABDOMEN:  Soft, nontender.  Positive, normoactive bowel sounds. No organomegaly palpated. MSK:  No focal spinal tenderness to palpation. Full range of motion bilaterally in the upper extremities. EXTREMITIES:  1+ bilateral lower extremity edema SKIN:  Clear with no obvious rashes or skin changes. No nail dyscrasia. NEURO:  Nonfocal. Well oriented.  Appropriate affect.  LAB RESULTS:   Lab Results  Component Value Date   WBC 6.2 03/26/2014   NEUTROABS 4.3 03/26/2014   HGB 10.0* 03/26/2014   HCT 30.9* 03/26/2014   MCV 92.4 03/26/2014   PLT 199 03/26/2014      Chemistry      Component Value Date/Time   NA 145 03/26/2014 1129   NA 143 03/21/2014 0801   K 3.5 03/26/2014 1129   K 3.2* 03/21/2014 0801   CL 108 03/21/2014 0801   CO2 26 03/26/2014 1129   CO2 25 02/12/2014 1003   BUN 10.0 03/26/2014 1129   BUN 8 03/21/2014 0801   CREATININE 0.7 03/26/2014 1129   CREATININE 0.60 03/21/2014 0801      Component Value Date/Time   CALCIUM 9.1 03/26/2014 1129   CALCIUM 10.0 02/12/2014  1003   ALKPHOS 86 03/26/2014 1129   ALKPHOS 92 02/12/2014 1003   AST 18 03/26/2014 1129   AST 16 02/12/2014 1003   ALT 17 03/26/2014 1129   ALT 16 02/12/2014 1003   BILITOT 0.57 03/26/2014 1129   BILITOT 0.4 02/12/2014 1003       STUDIES: Mr Breast Bilateral W Wo Contrast  01/09/2014   CLINICAL DATA:  Patient with history of left breast carcinoma. Evaluate response to neoadjuvant chemotherapy.  EXAM: BILATERAL BREAST MRI WITH AND WITHOUT CONTRAST  TECHNIQUE: Multiplanar, multisequence MR images of both breasts were obtained prior to and following the intravenous administration of 1m of MultiHance.  THREE-DIMENSIONAL MR IMAGE RENDERING ON INDEPENDENT WORKSTATION:  Three-dimensional MR images were rendered by post-processing of the original MR data on an independent workstation. The three-dimensional MR images were interpreted, and findings are reported in the following complete MRI report for this study. Three dimensional images were evaluated at the independent DynaCad workstation  COMPARISON:  Previous exams  FINDINGS: Breast composition: b.  Scattered fibroglandular tissue.  Background parenchymal enhancement: Mild  Right breast: No mass or abnormal enhancement.  Left breast: Previously described mass within the upper-outer left breast middle depth demonstrates no residual contrast enhancement. Residual nonenhancing tissue measures 1.8 x 1.3 cm, previously 3.1 x 2.9 cm at the same level.  Susceptibility artifact is demonstrated within the lower inner left breast anterior depth at the site of previously biopsied enhancing mass. This was biopsied and represented benign pathology.  No concerning areas of enhancement are identified within the left breast.  Lymph nodes: No abnormal appearing lymph nodes.  Ancillary findings:  None.  IMPRESSION: No residual enhancement identified within the known left breast carcinoma.  RECOMMENDATION: Treatment plan  BI-RADS CATEGORY  6: Known biopsy-proven malignancy.    Electronically Signed   By: DLovey NewcomerM.D.   On: 01/09/2014 12:32    ASSESSMENT: 63y.o. Arp woman status post left breast biopsy 10/11/2013 for a clinical T2 N0, stage IIA invasive ductal carcinoma, grade 3, estrogen and progesterone receptor positive, with an MIB-1 of 87%, and HER-2 amplified  (a) additional Left breast and left axillary node biopsy 10/30/2013 benign  (1) started neoadjuvant therapy 11/20/2013 , consisting of carboplatin, docetaxel, trastuzumab and pertuzumab every 3  weeks x6 completed 03/05/2014   (2) trastuzumab alone to continue to complete a year; most recent echocardiogram 01/24/2014 shows a well preserved ejection fraction.  (3) Patient underwent left lumpectomy with sentinel node biopsy on 03/21/14. Pathology results demonstrated a complete response, pT0 pN0  (4) radiation to follow surgery  (5) anti-estrogens to follow radiation  PLAN:   Hlee is doing well today.  I reviewed her CBC with her which is stable.  She will proceed with Trastuzumab today.    I reviewed her pathology results which are great news.  She had a complete pathologic response to chemotherapy.   Malie will continue to receive Trastuzumab every 3 weeks.  I referred her back to Dr. Pablo Ledger.  She will see Dr. Jana Hakim in 2-3 months once she completes radiation therapy to discuss anti-estrogen therapy.    I spent 25 minutes counseling the patient face to face.  The total time spent in the appointment was 30 minutes.  Minette Headland, [place] [phone] 03/26/2014 10:43 PM   ADDENDUM: Jilliana has had the best possible response to her chemotherapy, namely a complete pathologic response. She understands that patients who obtained this response generally have a very good long-term prognosis. I am delighted for her.  Next at his radiation and she is a ready scheduled to meet with Dr. Pablo Ledger in mid October. We're going to continue the  trastuzumab of course to complete one year. She will continue to have echocardiograms every 3 months until the trastuzumab therapy is completed.  Once she completes her radiation treatments Madelene will meet with me again and we will discuss antiestrogens.  She has a good understanding of the overall plan. She knows the goal of treatment in her case is cure. She will call with any problems that may develop before her next visit here.  I personally saw this patient and performed a substantive portion of this encounter with the listed APP documented above.   Chauncey Cruel, MD

## 2014-03-26 NOTE — Patient Instructions (Signed)
[facility] Discharge Instructions for Patients Receiving Chemotherapy  Today you received the following chemotherapy agents Herceptin.      BELOW ARE SYMPTOMS THAT SHOULD BE REPORTED IMMEDIATELY:  *FEVER GREATER THAN 100.5 F  *CHILLS WITH OR WITHOUT FEVER  NAUSEA AND VOMITING THAT IS NOT CONTROLLED WITH YOUR NAUSEA MEDICATION  *UNUSUAL SHORTNESS OF BREATH  *UNUSUAL BRUISING OR BLEEDING  TENDERNESS IN MOUTH AND THROAT WITH OR WITHOUT PRESENCE OF ULCERS  *URINARY PROBLEMS  *BOWEL PROBLEMS  UNUSUAL RASH Items with * indicate a potential emergency and should be followed up as soon as possible.  Feel free to call the clinic you have any questions or concerns. The clinic phone number is [phone].    

## 2014-03-26 NOTE — Telephone Encounter (Signed)
Per staff message and POF I have scheduled appts. Advised scheduler of appts. JMW  

## 2014-03-26 NOTE — Telephone Encounter (Signed)
Pt returned Bernies call.  I relayed the message regarding pt's cancer all gone.  She was very Patent attorney.  Anderson Malta

## 2014-04-05 HISTORY — PX: CATARACT EXTRACTION, BILATERAL: SHX1313

## 2014-04-16 ENCOUNTER — Other Ambulatory Visit (HOSPITAL_BASED_OUTPATIENT_CLINIC_OR_DEPARTMENT_OTHER): Payer: Medicare Other

## 2014-04-16 ENCOUNTER — Other Ambulatory Visit: Payer: Self-pay | Admitting: Adult Health

## 2014-04-16 ENCOUNTER — Telehealth: Payer: Self-pay | Admitting: Oncology

## 2014-04-16 ENCOUNTER — Telehealth: Payer: Self-pay | Admitting: Adult Health

## 2014-04-16 ENCOUNTER — Ambulatory Visit (HOSPITAL_BASED_OUTPATIENT_CLINIC_OR_DEPARTMENT_OTHER): Payer: Medicare Other

## 2014-04-16 ENCOUNTER — Other Ambulatory Visit: Payer: Self-pay | Admitting: Oncology

## 2014-04-16 VITALS — BP 127/63 | HR 83 | Temp 98.7°F | Resp 18

## 2014-04-16 DIAGNOSIS — Z5112 Encounter for antineoplastic immunotherapy: Secondary | ICD-10-CM

## 2014-04-16 DIAGNOSIS — C50412 Malignant neoplasm of upper-outer quadrant of left female breast: Secondary | ICD-10-CM

## 2014-04-16 DIAGNOSIS — C50919 Malignant neoplasm of unspecified site of unspecified female breast: Secondary | ICD-10-CM

## 2014-04-16 LAB — CBC WITH DIFFERENTIAL/PLATELET
BASO%: 1.4 % (ref 0.0–2.0)
Basophils Absolute: 0.1 10*3/uL (ref 0.0–0.1)
EOS%: 1.5 % (ref 0.0–7.0)
Eosinophils Absolute: 0.1 10*3/uL (ref 0.0–0.5)
HCT: 33.4 % — ABNORMAL LOW (ref 34.8–46.6)
HEMOGLOBIN: 10.9 g/dL — AB (ref 11.6–15.9)
LYMPH%: 17.2 % (ref 14.0–49.7)
MCH: 29.4 pg (ref 25.1–34.0)
MCHC: 32.7 g/dL (ref 31.5–36.0)
MCV: 89.8 fL (ref 79.5–101.0)
MONO#: 0.7 10*3/uL (ref 0.1–0.9)
MONO%: 9.9 % (ref 0.0–14.0)
NEUT#: 4.7 10*3/uL (ref 1.5–6.5)
NEUT%: 70 % (ref 38.4–76.8)
PLATELETS: 259 10*3/uL (ref 145–400)
RBC: 3.72 10*6/uL (ref 3.70–5.45)
RDW: 14.7 % — ABNORMAL HIGH (ref 11.2–14.5)
WBC: 6.8 10*3/uL (ref 3.9–10.3)
lymph#: 1.2 10*3/uL (ref 0.9–3.3)

## 2014-04-16 LAB — COMPREHENSIVE METABOLIC PANEL (CC13)
ALT: 13 U/L (ref 0–55)
ANION GAP: 9 meq/L (ref 3–11)
AST: 16 U/L (ref 5–34)
Albumin: 3.5 g/dL (ref 3.5–5.0)
Alkaline Phosphatase: 103 U/L (ref 40–150)
BILIRUBIN TOTAL: 0.42 mg/dL (ref 0.20–1.20)
BUN: 10.9 mg/dL (ref 7.0–26.0)
CALCIUM: 9.8 mg/dL (ref 8.4–10.4)
CO2: 27 meq/L (ref 22–29)
CREATININE: 0.8 mg/dL (ref 0.6–1.1)
Chloride: 110 mEq/L — ABNORMAL HIGH (ref 98–109)
Glucose: 85 mg/dl (ref 70–140)
Potassium: 3.8 mEq/L (ref 3.5–5.1)
Sodium: 145 mEq/L (ref 136–145)
Total Protein: 6.5 g/dL (ref 6.4–8.3)

## 2014-04-16 MED ORDER — ACETAMINOPHEN 325 MG PO TABS: 650.0000 mg | ORAL_TABLET | Freq: Once | ORAL | Status: DC

## 2014-04-16 MED ORDER — SODIUM CHLORIDE 0.9 % IJ SOLN
10.0000 mL | INTRAMUSCULAR | Status: DC | PRN
  Administered 2014-04-16: 10 mL
  Filled 2014-04-16: qty 10

## 2014-04-16 MED ORDER — TRASTUZUMAB CHEMO INJECTION 440 MG
6.0000 mg/kg | Freq: Once | INTRAVENOUS | Status: AC
  Administered 2014-04-16: 609 mg via INTRAVENOUS
  Filled 2014-04-16: qty 29

## 2014-04-16 MED ORDER — DIPHENHYDRAMINE HCL 25 MG PO CAPS
25.0000 mg | ORAL_CAPSULE | Freq: Once | ORAL | Status: AC
  Administered 2014-04-16: 25 mg via ORAL

## 2014-04-16 MED ORDER — DIPHENHYDRAMINE HCL 25 MG PO CAPS
ORAL_CAPSULE | ORAL | Status: AC
  Filled 2014-04-16: qty 1

## 2014-04-16 MED ORDER — SODIUM CHLORIDE 0.9 % IV SOLN
Freq: Once | INTRAVENOUS | Status: AC
  Administered 2014-04-16: 09:00:00 via INTRAVENOUS

## 2014-04-16 MED ORDER — HEPARIN SOD (PORK) LOCK FLUSH 100 UNIT/ML IV SOLN
500.0000 [IU] | Freq: Once | INTRAVENOUS | Status: AC | PRN
  Administered 2014-04-16: 500 [IU]
  Filled 2014-04-16: qty 5

## 2014-04-16 NOTE — Telephone Encounter (Signed)
per pof to sch pt ECHO-sent to [facility] for pre-cert-will call pt once reply

## 2014-04-16 NOTE — Patient Instructions (Signed)
[facility] Discharge Instructions for Patients Receiving Chemotherapy  Today you received the following chemotherapy agents Herceptin  To help prevent nausea and vomiting after your treatment, we encourage you to take your nausea medication     If you develop nausea and vomiting that is not controlled by your nausea medication, call the clinic.   BELOW ARE SYMPTOMS THAT SHOULD BE REPORTED IMMEDIATELY:  *FEVER GREATER THAN 100.5 F  *CHILLS WITH OR WITHOUT FEVER  NAUSEA AND VOMITING THAT IS NOT CONTROLLED WITH YOUR NAUSEA MEDICATION  *UNUSUAL SHORTNESS OF BREATH  *UNUSUAL BRUISING OR BLEEDING  TENDERNESS IN MOUTH AND THROAT WITH OR WITHOUT PRESENCE OF ULCERS  *URINARY PROBLEMS  *BOWEL PROBLEMS  UNUSUAL RASH Items with * indicate a potential emergency and should be followed up as soon as possible.  Feel free to call the clinic you have any questions or concerns. The clinic phone number is [phone].    

## 2014-04-16 NOTE — Progress Notes (Signed)
Location of Breast Cancer:Left Breast 9 mm mass in th inner lower breast. And 7 mm mass in 7:30 o'clock position. 3 cm mass in upper-outer quad.  Histology per Pathology Report:03/21/14  FINAL DIAGNOSIS Diagnosis 1. Breast, lumpectomy, left - FIBROCYSTIC CHANGES WITH CALCIFICATIONS. - CHANGES CONSISTENT WITH CHEMOTHERAPY TREATMENT. - HEALING BIOPSY SITE. - THERE IS NO EVIDENCE OF MALIGNANCY. - SEE ONCOLOGY TABLE BELOW. 2. Lymph node, sentinel, biopsy, node # 1 axillary - THERE IS NO EVIDENCE OF CARCINOMA IN 1 OF 1 LYMPH NODE (0/1). 3. Lymph node, sentinel, biopsy, node # 2 axillary - THERE IS NO EVIDENCE OF CARCINOMA IN 1 OF 1 LYMPH NODE (0/1). 4. Lymph node, sentinel, biopsy, node # 3 axillary - THERE IS NO EVIDENCE OF CARCINOMA IN 1 OF 1 LYMPH NODE (0/1). 5. Lymph node, sentinel, biopsy, node #4 axillary - THERE IS NO EVIDENCE OF CARCINOMA IN 1 OF 1 LYMPH NODE (0/1). 10/30/13 FINAL DIAGNOSIS Diagnosis 1. Breast, left, needle core biopsy, mass, 7:30 o'clock - BENIGN BREAST PARENCHYMA WITH FOCAL FIBROCYSTIC CHANGES. - NO ATYPIA, HYPERPLASIA, OR MALIGNANCY IDENTIFIED. - SEE COMMENT. 2. Lymph node, needle/core biopsy, left axillary - ONE BENIGN LYMPH NODE WITH NO TUMOR SEEN (0/1).  Receptor Status: ER (+), PR (+), Her2-neu ()  Did patient present with symptoms (if so, please note symptoms) or was this found on screening mammography?: Found on screening and confirmed on diagnostic mammogram and ultrasound. 10/23/13:bilateral breast mri.  Past/Anticipated interventions by surgeon, if any:03/21/14 [place] WITH AXILLARY SENTINEL LYMPH NODE BIOPSY by Dr.Faera Barry Dienes   Past/Anticipated interventions by medical oncology, if any: Chemotherapy:docetaxel/carboplatin with trastuzumab and pertuzumab.Scheduled for 6 cycles.Anti-estrogen therapy to follow radiation  Lymphedema issues, if any: No  Pain issues, if and No  SAFETY ISSUES:  Prior radiation?  No  Pacemaker/ICD? No  Possible current pregnancy?No  Is the patient on methotrexate? 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 Menache age 36. Patient  is Dic[place] P0.menopause in her 22s.Took hormonal replacement therapy  Until 2010. Patient is adopted and has no information on biological family. No smoking history.Drinks socially.    Arlyss Repress, RN 04/16/2014,9:33 AM

## 2014-04-16 NOTE — Telephone Encounter (Signed)
LVM with apt d/t for 05/11/14 for Dr. Amanda Pea

## 2014-04-18 ENCOUNTER — Ambulatory Visit
Admission: RE | Admit: 2014-04-18 | Discharge: 2014-04-18 | Disposition: A | Discharge status: Home / Self Care | Payer: Medicare Other | Source: Ambulatory Visit | Attending: Radiation Oncology | Admitting: Radiation Oncology

## 2014-04-18 ENCOUNTER — Encounter: Payer: Self-pay | Admitting: Radiation Oncology

## 2014-04-18 VITALS — BP 135/69 | HR 84 | Temp 98.2°F | Wt 216.8 lb

## 2014-04-18 DIAGNOSIS — C50412 Malignant neoplasm of upper-outer quadrant of left female breast: Secondary | ICD-10-CM | POA: Diagnosis not present

## 2014-04-18 DIAGNOSIS — Z510 Encounter for antineoplastic radiation therapy: Secondary | ICD-10-CM | POA: Diagnosis present

## 2014-04-18 NOTE — Progress Notes (Addendum)
   Department of Radiation Oncology  Phone:  [phone] Fax:        [phone]   Name: IOMA CHISMAR MRN: [id number]  DOB: 09-29-1950  Date: 04/18/2014  Follow Up Visit Note  Diagnosis:    ICD-9-CM ICD-10-CM  1. Breast cancer of upper-outer quadrant of left female breast 174.4 C50.412   Interval History: April Walker presents today for routine followup.  She completed neoadjuvant chemotherapy and was found to have a pathologic complete response on lumpectomy on 03/21/14. She is pleased with this result and is ready to start radiation. She has healed well from surgery. She will continue on herceptin during radiation. She has good range of motion in her arm. She has no pain.   Physical Exam:  Filed Vitals:   04/18/14 1404  BP: 135/69  Pulse: 84  Temp: 98.2 F (36.8 C)  Weight: 216 lb 12.8 oz (98.34 kg)   Healing lumpectomy incision over the left breast. No signs of infection.   IMPRESSION: Tyshawna is a 63 y.o. female s/p neoadjuvant chemo with a pathologic complete response on lumpectomy SLN bx (stage T1cnNo initially.   PLAN:  We discussed the role of radiation and decreasing local failures in patients who undergo lumpectomy. We discussed the retrospective data showing an increase in failure rates in patients who have a pathologic complete response and did not undergo radiation. For this reason I have recommended radiation to the whole breast followed by boost to the tumor bed. We discussed the process of simulation the placement tattoos. We discussed possible side effects during treatment including but not limited to skin irritation darkness and fatigue. We discussed long-term effects of treatment which are extremely unlikely but possible including damage to the lungs and ribs. We discussed the use of breath hold technique for cardiac sparing if necessary. We discussed the low likelihood of secondary malignancies. I let her know that nursing would be doing skin teaching with her. She  signed informed consent and agree to proceed forward with simulation next week.    [name], MD

## 2014-04-24 ENCOUNTER — Ambulatory Visit
Admission: RE | Admit: 2014-04-24 | Discharge: 2014-04-24 | Disposition: A | Discharge status: Home / Self Care | Payer: Medicare Other | Source: Ambulatory Visit | Attending: Radiation Oncology | Admitting: Radiation Oncology

## 2014-04-24 DIAGNOSIS — Z510 Encounter for antineoplastic radiation therapy: Secondary | ICD-10-CM | POA: Diagnosis not present

## 2014-04-30 DIAGNOSIS — Z510 Encounter for antineoplastic radiation therapy: Secondary | ICD-10-CM | POA: Diagnosis not present

## 2014-05-01 ENCOUNTER — Ambulatory Visit
Admission: RE | Admit: 2014-05-01 | Discharge: 2014-05-01 | Disposition: A | Discharge status: Home / Self Care | Payer: Medicare Other | Source: Ambulatory Visit | Attending: Radiation Oncology | Admitting: Radiation Oncology

## 2014-05-01 DIAGNOSIS — Z510 Encounter for antineoplastic radiation therapy: Secondary | ICD-10-CM | POA: Diagnosis not present

## 2014-05-01 DIAGNOSIS — C50412 Malignant neoplasm of upper-outer quadrant of left female breast: Secondary | ICD-10-CM

## 2014-05-01 NOTE — Progress Notes (Signed)
  Radiation Oncology         [phone] ________________________________  Name: April Walker MRN: [id number]  Date: 05/01/2014  DOB: 1951/06/09  Simulation Verification Note    ICD-9-CM ICD-10-CM  1. Breast cancer of upper-outer quadrant of left female breast 174.4 C50.412    Status: outpatient  NARRATIVE: The patient was brought to the treatment unit and placed in the planned treatment position. The clinical setup was verified. Then port films were obtained and uploaded to the radiation oncology medical record software.  The treatment beams were carefully compared against the planned radiation fields. The position location and shape of the radiation fields was reviewed. They targeted volume of tissue appears to be appropriately covered by the radiation beams. Organs at risk appear to be excluded as planned.  Based on my personal review, I approved the simulation verification. The patient's treatment will proceed as planned.  -----------------------------------  Blair Promise, PhD, MD

## 2014-05-02 ENCOUNTER — Ambulatory Visit
Admission: RE | Admit: 2014-05-02 | Discharge: 2014-05-02 | Disposition: A | Discharge status: Home / Self Care | Payer: Medicare Other | Source: Ambulatory Visit | Attending: Radiation Oncology | Admitting: Radiation Oncology

## 2014-05-02 DIAGNOSIS — Z510 Encounter for antineoplastic radiation therapy: Secondary | ICD-10-CM | POA: Diagnosis not present

## 2014-05-03 ENCOUNTER — Ambulatory Visit
Admission: RE | Admit: 2014-05-03 | Discharge: 2014-05-03 | Disposition: A | Discharge status: Home / Self Care | Payer: Medicare Other | Source: Ambulatory Visit | Attending: Radiation Oncology | Admitting: Radiation Oncology

## 2014-05-03 DIAGNOSIS — C50412 Malignant neoplasm of upper-outer quadrant of left female breast: Secondary | ICD-10-CM

## 2014-05-03 DIAGNOSIS — Z510 Encounter for antineoplastic radiation therapy: Secondary | ICD-10-CM | POA: Diagnosis not present

## 2014-05-03 MED ORDER — ALRA NON-METALLIC DEODORANT (RAD-ONC)
1.0000 "application " | Freq: Once | TOPICAL | Status: AC
  Administered 2014-05-03: 1 via TOPICAL

## 2014-05-03 MED ORDER — RADIAPLEXRX EX GEL
Freq: Once | CUTANEOUS | Status: AC
  Administered 2014-05-03: 09:00:00 via TOPICAL

## 2014-05-03 NOTE — Progress Notes (Signed)
Routine of clinic and side effects of treatment reviewed with patient.Given Radiation Therapy and You Booklet, skin care sheet, alra deodorant and radiaplex.Informed to apply radiaplex twice daily.May have skin changes consistent with sunburn and possible itchy rash, tenderness, swelling and fatigue.Continue with routine activities, rest and power nap if needed and drink water to promote hydration.

## 2014-05-04 ENCOUNTER — Ambulatory Visit
Admission: RE | Admit: 2014-05-04 | Discharge: 2014-05-04 | Disposition: A | Discharge status: Home / Self Care | Payer: Medicare Other | Source: Ambulatory Visit | Attending: Radiation Oncology | Admitting: Radiation Oncology

## 2014-05-04 DIAGNOSIS — Z510 Encounter for antineoplastic radiation therapy: Secondary | ICD-10-CM | POA: Diagnosis not present

## 2014-05-07 ENCOUNTER — Ambulatory Visit
Admission: RE | Admit: 2014-05-07 | Discharge: 2014-05-07 | Disposition: A | Discharge status: Home / Self Care | Payer: Medicare Other | Source: Ambulatory Visit | Attending: Radiation Oncology | Admitting: Radiation Oncology

## 2014-05-07 ENCOUNTER — Ambulatory Visit (HOSPITAL_BASED_OUTPATIENT_CLINIC_OR_DEPARTMENT_OTHER): Payer: Medicare Other

## 2014-05-07 ENCOUNTER — Other Ambulatory Visit (HOSPITAL_BASED_OUTPATIENT_CLINIC_OR_DEPARTMENT_OTHER): Payer: Medicare Other

## 2014-05-07 DIAGNOSIS — Z5112 Encounter for antineoplastic immunotherapy: Secondary | ICD-10-CM

## 2014-05-07 DIAGNOSIS — C50412 Malignant neoplasm of upper-outer quadrant of left female breast: Secondary | ICD-10-CM

## 2014-05-07 DIAGNOSIS — Z510 Encounter for antineoplastic radiation therapy: Secondary | ICD-10-CM | POA: Diagnosis not present

## 2014-05-07 LAB — CBC WITH DIFFERENTIAL/PLATELET
BASO%: 0.8 % (ref 0.0–2.0)
BASOS ABS: 0 10*3/uL (ref 0.0–0.1)
EOS ABS: 0.2 10*3/uL (ref 0.0–0.5)
EOS%: 4 % (ref 0.0–7.0)
HCT: 36.8 % (ref 34.8–46.6)
HEMOGLOBIN: 11.7 g/dL (ref 11.6–15.9)
LYMPH#: 1.3 10*3/uL (ref 0.9–3.3)
LYMPH%: 27.7 % (ref 14.0–49.7)
MCH: 28.3 pg (ref 25.1–34.0)
MCHC: 31.8 g/dL (ref 31.5–36.0)
MCV: 89.1 fL (ref 79.5–101.0)
MONO#: 0.4 10*3/uL (ref 0.1–0.9)
MONO%: 7.5 % (ref 0.0–14.0)
NEUT%: 60 % (ref 38.4–76.8)
NEUTROS ABS: 2.9 10*3/uL (ref 1.5–6.5)
Platelets: 202 10*3/uL (ref 145–400)
RBC: 4.13 10*6/uL (ref 3.70–5.45)
RDW: 13.8 % (ref 11.2–14.5)
WBC: 4.8 10*3/uL (ref 3.9–10.3)

## 2014-05-07 LAB — COMPREHENSIVE METABOLIC PANEL (CC13)
ALBUMIN: 3.8 g/dL (ref 3.5–5.0)
ALT: 16 U/L (ref 0–55)
ANION GAP: 8 meq/L (ref 3–11)
AST: 16 U/L (ref 5–34)
Alkaline Phosphatase: 97 U/L (ref 40–150)
BUN: 15.9 mg/dL (ref 7.0–26.0)
CALCIUM: 9.7 mg/dL (ref 8.4–10.4)
CHLORIDE: 111 meq/L — AB (ref 98–109)
CO2: 27 meq/L (ref 22–29)
CREATININE: 0.8 mg/dL (ref 0.6–1.1)
GLUCOSE: 85 mg/dL (ref 70–140)
POTASSIUM: 3.6 meq/L (ref 3.5–5.1)
Sodium: 146 mEq/L — ABNORMAL HIGH (ref 136–145)
Total Bilirubin: 0.67 mg/dL (ref 0.20–1.20)
Total Protein: 6.6 g/dL (ref 6.4–8.3)

## 2014-05-07 MED ORDER — HEPARIN SOD (PORK) LOCK FLUSH 100 UNIT/ML IV SOLN
500.0000 [IU] | Freq: Once | INTRAVENOUS | Status: AC | PRN
  Administered 2014-05-07: 500 [IU]
  Filled 2014-05-07: qty 5

## 2014-05-07 MED ORDER — TRASTUZUMAB CHEMO INJECTION 440 MG
6.0000 mg/kg | Freq: Once | INTRAVENOUS | Status: AC
  Administered 2014-05-07: 609 mg via INTRAVENOUS
  Filled 2014-05-07: qty 29

## 2014-05-07 MED ORDER — ACETAMINOPHEN 325 MG PO TABS
ORAL_TABLET | ORAL | Status: AC
  Filled 2014-05-07: qty 2

## 2014-05-07 MED ORDER — DIPHENHYDRAMINE HCL 25 MG PO CAPS
ORAL_CAPSULE | ORAL | Status: AC
  Filled 2014-05-07: qty 1

## 2014-05-07 MED ORDER — SODIUM CHLORIDE 0.9 % IV SOLN
Freq: Once | INTRAVENOUS | Status: AC
  Administered 2014-05-07: 10:00:00 via INTRAVENOUS

## 2014-05-07 MED ORDER — ACETAMINOPHEN 325 MG PO TABS
650.0000 mg | ORAL_TABLET | Freq: Once | ORAL | Status: AC
  Administered 2014-05-07: 650 mg via ORAL

## 2014-05-07 MED ORDER — SODIUM CHLORIDE 0.9 % IJ SOLN
10.0000 mL | INTRAMUSCULAR | Status: DC | PRN
  Administered 2014-05-07: 10 mL
  Filled 2014-05-07: qty 10

## 2014-05-07 MED ORDER — DIPHENHYDRAMINE HCL 25 MG PO CAPS
25.0000 mg | ORAL_CAPSULE | Freq: Once | ORAL | Status: AC
  Administered 2014-05-07: 25 mg via ORAL

## 2014-05-07 NOTE — Patient Instructions (Signed)
[facility] Discharge Instructions for Patients Receiving Chemotherapy  Today you received the following chemotherapy agents: Herceptin. To help prevent nausea and vomiting after your treatment, we encourage you to take your nausea medication: As directed.   If you develop nausea and vomiting that is not controlled by your nausea medication, call the clinic.   BELOW ARE SYMPTOMS THAT SHOULD BE REPORTED IMMEDIATELY:  *FEVER GREATER THAN 100.5 F  *CHILLS WITH OR WITHOUT FEVER  NAUSEA AND VOMITING THAT IS NOT CONTROLLED WITH YOUR NAUSEA MEDICATION  *UNUSUAL SHORTNESS OF BREATH  *UNUSUAL BRUISING OR BLEEDING  TENDERNESS IN MOUTH AND THROAT WITH OR WITHOUT PRESENCE OF ULCERS  *URINARY PROBLEMS  *BOWEL PROBLEMS  UNUSUAL RASH Items with * indicate a potential emergency and should be followed up as soon as possible.  Feel free to call the clinic you have any questions or concerns. The clinic phone number is [phone].

## 2014-05-08 ENCOUNTER — Encounter (HOSPITAL_COMMUNITY): Payer: Self-pay

## 2014-05-08 ENCOUNTER — Ambulatory Visit
Admission: RE | Admit: 2014-05-08 | Discharge: 2014-05-08 | Disposition: A | Discharge status: Home / Self Care | Payer: Medicare Other | Source: Ambulatory Visit | Attending: Radiation Oncology | Admitting: Radiation Oncology

## 2014-05-08 ENCOUNTER — Ambulatory Visit (HOSPITAL_COMMUNITY)
Admission: RE | Admit: 2014-05-08 | Discharge: 2014-05-08 | Disposition: A | Discharge status: Home / Self Care | Payer: Medicare Other | Source: Ambulatory Visit | Attending: Internal Medicine | Admitting: Internal Medicine

## 2014-05-08 ENCOUNTER — Ambulatory Visit (HOSPITAL_BASED_OUTPATIENT_CLINIC_OR_DEPARTMENT_OTHER)
Admission: RE | Admit: 2014-05-08 | Discharge: 2014-05-08 | Disposition: A | Discharge status: Home / Self Care | Payer: Medicare Other | Source: Ambulatory Visit | Attending: Internal Medicine | Admitting: Internal Medicine

## 2014-05-08 VITALS — BP 140/66 | HR 73 | Temp 98.5°F | Wt 212.3 lb

## 2014-05-08 VITALS — BP 107/69 | HR 77 | Resp 18 | Wt 211.8 lb

## 2014-05-08 DIAGNOSIS — Z79899 Other long term (current) drug therapy: Secondary | ICD-10-CM | POA: Diagnosis not present

## 2014-05-08 DIAGNOSIS — C50412 Malignant neoplasm of upper-outer quadrant of left female breast: Secondary | ICD-10-CM

## 2014-05-08 DIAGNOSIS — R600 Localized edema: Secondary | ICD-10-CM | POA: Diagnosis not present

## 2014-05-08 DIAGNOSIS — I10 Essential (primary) hypertension: Secondary | ICD-10-CM | POA: Insufficient documentation

## 2014-05-08 DIAGNOSIS — I519 Heart disease, unspecified: Secondary | ICD-10-CM

## 2014-05-08 DIAGNOSIS — K219 Gastro-esophageal reflux disease without esophagitis: Secondary | ICD-10-CM | POA: Diagnosis not present

## 2014-05-08 DIAGNOSIS — C50919 Malignant neoplasm of unspecified site of unspecified female breast: Secondary | ICD-10-CM

## 2014-05-08 DIAGNOSIS — Z510 Encounter for antineoplastic radiation therapy: Secondary | ICD-10-CM | POA: Diagnosis not present

## 2014-05-08 DIAGNOSIS — E785 Hyperlipidemia, unspecified: Secondary | ICD-10-CM | POA: Insufficient documentation

## 2014-05-08 MED ORDER — POTASSIUM CHLORIDE ER 10 MEQ PO TBCR: 20.0000 meq | EXTENDED_RELEASE_TABLET | Freq: Every day | ORAL | Status: DC | PRN

## 2014-05-08 MED ORDER — FUROSEMIDE 20 MG PO TABS: 20.0000 mg | ORAL_TABLET | Freq: Every day | ORAL | Status: DC | PRN

## 2014-05-08 NOTE — Progress Notes (Signed)
Patient ID: April Walker, female   DOB: 1951-05-13, 63 y.o.   MRN: [id number] Patient ID: April Walker, female   DOB: 1951/03/02, 64 y.o.   MRN: [id number] PCP: Dr. Levin Erp Oncologist: Dr. Jana Hakim  63 yo with recent diagnosis of breast cancer presents for cardio-oncology evaluation.  Left breast mass was found in 3/15 by mammogram, biopsy with ER+/PR+/HER2-neu amplified locally advanced left breast cancer.  Patient is to undergo 6 cycles docetaxel/carboplatin/trastuzumab/pertuzumab (has started this) then trastuzumab x 1 year.  She will have surgery after chemotherapy then radiation.   She has done well so far. She is now getting Herceptin alone. Not getting Perjeta. Continues to have dyspnea when hiking up hills. Mild edema.   PMH: 1. Breast cancer: Left breast mass found 3/15 by mammogram, biopsy with ER+/PR+/HER2-neu amplified locally advanced left breast cancer.  Patient is to undergo 6 cycles docetaxel/carboplatin/trastuzumab/pertuzumab then trastuzumab x 1 year.  She will have surgery after chemotherapy then radiation.   - Echo (5/15) with EF 55-60%, GLS -18%. - Echo (7/15) with EF 60-65%, lateral s' 11.4, GLS -19.4%. - Echo (11/15) EF 55-60% lateral s' 11.2 GLS -17.4  2. Hyperlipidemia 3. GERD 4. Colonic adenoma 5. OA: Left TKA 6. Chest pain: Prior stress test several years ago was normal.  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   FH: Adopted, unsure.   ROS: All systems reviewed and negative except as per HPI.   Current Outpatient Prescriptions  Medication Sig Dispense Refill  . Acetaminophen (TYLENOL EXTRA STRENGTH PO) Take by mouth.    Marland Kitchen acyclovir (ZOVIRAX) 400 MG tablet Take 1 tablet (400 mg total) by mouth 2 (two) times daily. 60 tablet 2  . hydrocortisone (ANUSOL-HC) 25 MG suppository Place 1 suppository (25 mg total) rectally at bedtime as needed for hemorrhoids or itching. 10 suppository 0  . loratadine (CLARITIN) 10 MG tablet Take 10 mg  by mouth daily as needed. For allergies    . omeprazole (PRILOSEC) 20 MG capsule Take 20 mg by mouth as needed.    Marland Kitchen oxyCODONE-acetaminophen (ROXICET) 5-325 MG per tablet Take 1-2 tablets by mouth every 4 (four) hours as needed for severe pain. 30 tablet 0  . simvastatin (ZOCOR) 10 MG tablet Take 10 mg by mouth daily. Pt TAKES AT BEDTIME     No current facility-administered medications for this encounter.    BP 107/69 mmHg  Pulse 77  Resp 18  Wt 211 lb 12 oz (96.049 kg)  SpO2 97% General: NAD Neck: No JVD, no thyromegaly or thyroid nodule.  Lungs: Clear to auscultation bilaterally with normal respiratory effort. CV: Nondisplaced PMI.  Heart regular S1/S2, no S3/S4, no murmur.  No carotid bruit.  Normal pedal pulses.  Abdomen: obese. soft, nontender, no hepatosplenomegaly, no distention.  Skin: Intact without lesions or rashes.  Neurologic: Alert and oriented x 3.  Psych: Normal affect. Extremities: No clubbing or cyanosis. Tr-1+edema L>R  HEENT: Normal.   Assessment/Plan: 1. Left breast CA 2. LE edema  I reviewed echos personally. EF and Doppler parameters stable. No HF on exam. Continue Herceptin.   She has mild edema. Will give lasix 20 mg to use prn for severe swelling . Take with Kcl 20. Can use compression stockings as needed.   She does snore while lying on her back but not her side. May need work-up for OSA at some point.   Quillian Quince Leyland Kenna 05/08/2014

## 2014-05-08 NOTE — Progress Notes (Signed)
Weekly Management Note Current Dose: 9 Gy  Projected Dose: 61 Gy   Narrative:  The patient presents for routine under treatment assessment.  CBCT/MVCT images/Port film x-rays were reviewed.  The chart was checked. Doing well. No complaints. Questions about radiation dose and taking viatmin b during RT   Physical Findings: Weight: 212 lb 4.8 oz (96.299 kg). Unchanged  Impression:  The patient is tolerating radiation.  Plan:  Continue treatment as planned. Discussed whole body vs. Focal RT.  Discussed no evidence for Vitamin B improving fatigue in radiation patients.

## 2014-05-08 NOTE — Patient Instructions (Signed)
Lasix 20 mg as needed and Potassium 20 MeQ as needed  Your physician recommends that you schedule a follow-up appointment in: 3 months with Echocardiogram

## 2014-05-08 NOTE — Progress Notes (Signed)
Echo Lab  2D Echocardiogram completed.  [place], RDCS 05/08/2014 9:52 AM

## 2014-05-08 NOTE — Addendum Note (Signed)
Encounter addended by: Kerry Dory, CMA on: 05/08/2014 11:28 AM<BR>     Documentation filed: Dx Association, Patient Instructions Section, Orders

## 2014-05-09 ENCOUNTER — Ambulatory Visit
Admission: RE | Admit: 2014-05-09 | Discharge: 2014-05-09 | Disposition: A | Discharge status: Home / Self Care | Payer: Medicare Other | Source: Ambulatory Visit | Attending: Radiation Oncology | Admitting: Radiation Oncology

## 2014-05-09 DIAGNOSIS — Z510 Encounter for antineoplastic radiation therapy: Secondary | ICD-10-CM | POA: Diagnosis not present

## 2014-05-10 ENCOUNTER — Ambulatory Visit
Admission: RE | Admit: 2014-05-10 | Discharge: 2014-05-10 | Disposition: A | Discharge status: Home / Self Care | Payer: Medicare Other | Source: Ambulatory Visit | Attending: Radiation Oncology | Admitting: Radiation Oncology

## 2014-05-10 DIAGNOSIS — Z510 Encounter for antineoplastic radiation therapy: Secondary | ICD-10-CM | POA: Diagnosis not present

## 2014-05-11 ENCOUNTER — Ambulatory Visit
Admission: RE | Admit: 2014-05-11 | Discharge: 2014-05-11 | Disposition: A | Discharge status: Home / Self Care | Payer: Medicare Other | Source: Ambulatory Visit | Attending: Radiation Oncology | Admitting: Radiation Oncology

## 2014-05-11 DIAGNOSIS — Z510 Encounter for antineoplastic radiation therapy: Secondary | ICD-10-CM | POA: Diagnosis not present

## 2014-05-14 ENCOUNTER — Ambulatory Visit
Admission: RE | Admit: 2014-05-14 | Discharge: 2014-05-14 | Disposition: A | Discharge status: Home / Self Care | Payer: Medicare Other | Source: Ambulatory Visit | Attending: Radiation Oncology | Admitting: Radiation Oncology

## 2014-05-14 DIAGNOSIS — Z510 Encounter for antineoplastic radiation therapy: Secondary | ICD-10-CM | POA: Diagnosis not present

## 2014-05-15 ENCOUNTER — Encounter: Payer: Self-pay | Admitting: Radiation Oncology

## 2014-05-15 ENCOUNTER — Ambulatory Visit
Admission: RE | Admit: 2014-05-15 | Discharge: 2014-05-15 | Disposition: A | Discharge status: Home / Self Care | Payer: Medicare Other | Source: Ambulatory Visit | Attending: Radiation Oncology | Admitting: Radiation Oncology

## 2014-05-15 VITALS — BP 119/51 | HR 81 | Temp 98.6°F | Resp 16 | Wt 210.7 lb

## 2014-05-15 DIAGNOSIS — Z510 Encounter for antineoplastic radiation therapy: Secondary | ICD-10-CM | POA: Diagnosis not present

## 2014-05-15 DIAGNOSIS — C50412 Malignant neoplasm of upper-outer quadrant of left female breast: Secondary | ICD-10-CM

## 2014-05-15 NOTE — Progress Notes (Signed)
Weekly Management Note Current Dose: 18  Gy  Projected Dose: 61 Gy   Narrative:  The patient presents for routine under treatment assessment.  CBCT/MVCT images/Port film x-rays were reviewed.  The chart was checked. Doing well. Nausea after exercise yesterday. Skin is "itchy" and tingles some times.   Physical Findings: Weight: 210 lb 11.2 oz (95.573 kg). Minimal pink color.   Impression:  The patient is tolerating radiation.  Plan:  Continue treatment as planned. Continue radiaplex. Ease into exercise.

## 2014-05-15 NOTE — Progress Notes (Signed)
Weekly rad txs left breast 10/33 completed, mild erythema, occasional twinges in breast and itching, ,resolves quickly,using radiaplex bid, appetite good, mild fatigue 2:07 PM

## 2014-05-16 ENCOUNTER — Telehealth: Payer: Self-pay | Admitting: Nurse Practitioner

## 2014-05-16 ENCOUNTER — Ambulatory Visit
Admission: RE | Admit: 2014-05-16 | Discharge: 2014-05-16 | Disposition: A | Discharge status: Home / Self Care | Payer: Medicare Other | Source: Ambulatory Visit | Attending: Radiation Oncology | Admitting: Radiation Oncology

## 2014-05-16 DIAGNOSIS — Z510 Encounter for antineoplastic radiation therapy: Secondary | ICD-10-CM | POA: Diagnosis not present

## 2014-05-16 NOTE — Telephone Encounter (Signed)
, °

## 2014-05-17 ENCOUNTER — Ambulatory Visit
Admission: RE | Admit: 2014-05-17 | Discharge: 2014-05-17 | Disposition: A | Discharge status: Home / Self Care | Payer: Medicare Other | Source: Ambulatory Visit | Attending: Radiation Oncology | Admitting: Radiation Oncology

## 2014-05-17 DIAGNOSIS — Z510 Encounter for antineoplastic radiation therapy: Secondary | ICD-10-CM | POA: Diagnosis not present

## 2014-05-18 ENCOUNTER — Ambulatory Visit: Payer: Medicare Other

## 2014-05-21 ENCOUNTER — Ambulatory Visit
Admission: RE | Admit: 2014-05-21 | Discharge: 2014-05-21 | Disposition: A | Discharge status: Home / Self Care | Payer: Medicare Other | Source: Ambulatory Visit | Attending: Radiation Oncology | Admitting: Radiation Oncology

## 2014-05-21 DIAGNOSIS — Z510 Encounter for antineoplastic radiation therapy: Secondary | ICD-10-CM | POA: Diagnosis not present

## 2014-05-22 ENCOUNTER — Ambulatory Visit
Admission: RE | Admit: 2014-05-22 | Discharge: 2014-05-22 | Disposition: A | Discharge status: Home / Self Care | Payer: Medicare Other | Source: Ambulatory Visit | Attending: Radiation Oncology | Admitting: Radiation Oncology

## 2014-05-22 VITALS — BP 125/58 | HR 86 | Temp 98.4°F | Wt 209.5 lb

## 2014-05-22 DIAGNOSIS — C50412 Malignant neoplasm of upper-outer quadrant of left female breast: Secondary | ICD-10-CM

## 2014-05-22 DIAGNOSIS — Z510 Encounter for antineoplastic radiation therapy: Secondary | ICD-10-CM | POA: Diagnosis not present

## 2014-05-22 NOTE — Progress Notes (Signed)
Patient for weekly  Assessment of radiation to left breast.Skin is pink and itchy without any breaks in skin.Generalized achiness.Continue application of radiaplex.

## 2014-05-22 NOTE — Progress Notes (Signed)
Weekly Management Note Current Dose:  25.2 Gy  Projected Dose: 61 Gy   Narrative:  The patient presents for routine under treatment assessment.  CBCT/MVCT images/Port film x-rays were reviewed.  The chart was checked. Doing well. Working out and feeling good. Using radiaplex.   Physical Findings: Weight: 209 lb 8 oz (95.029 kg). Slightly pink skin on left breast   Impression:  The patient is tolerating radiation.  Plan:  Continue treatment as planned. Continue radiaplex. Discussed possible meeting with survivorship navigator.

## 2014-05-23 ENCOUNTER — Ambulatory Visit
Admission: RE | Admit: 2014-05-23 | Discharge: 2014-05-23 | Disposition: A | Discharge status: Home / Self Care | Payer: Medicare Other | Source: Ambulatory Visit | Attending: Radiation Oncology | Admitting: Radiation Oncology

## 2014-05-23 DIAGNOSIS — Z510 Encounter for antineoplastic radiation therapy: Secondary | ICD-10-CM | POA: Diagnosis not present

## 2014-05-24 ENCOUNTER — Ambulatory Visit
Admission: RE | Admit: 2014-05-24 | Discharge: 2014-05-24 | Disposition: A | Discharge status: Home / Self Care | Payer: Medicare Other | Source: Ambulatory Visit | Attending: Radiation Oncology | Admitting: Radiation Oncology

## 2014-05-24 DIAGNOSIS — Z510 Encounter for antineoplastic radiation therapy: Secondary | ICD-10-CM | POA: Diagnosis not present

## 2014-05-25 ENCOUNTER — Ambulatory Visit
Admission: RE | Admit: 2014-05-25 | Discharge: 2014-05-25 | Disposition: A | Discharge status: Home / Self Care | Payer: Medicare Other | Source: Ambulatory Visit | Attending: Radiation Oncology | Admitting: Radiation Oncology

## 2014-05-25 ENCOUNTER — Telehealth: Payer: Self-pay | Admitting: Nurse Practitioner

## 2014-05-25 DIAGNOSIS — Z510 Encounter for antineoplastic radiation therapy: Secondary | ICD-10-CM | POA: Diagnosis not present

## 2014-05-27 ENCOUNTER — Ambulatory Visit
Admission: RE | Admit: 2014-05-27 | Discharge: 2014-05-27 | Disposition: A | Discharge status: Home / Self Care | Payer: Medicare Other | Source: Ambulatory Visit | Attending: Radiation Oncology | Admitting: Radiation Oncology

## 2014-05-27 DIAGNOSIS — Z510 Encounter for antineoplastic radiation therapy: Secondary | ICD-10-CM | POA: Diagnosis not present

## 2014-05-28 ENCOUNTER — Ambulatory Visit
Admission: RE | Admit: 2014-05-28 | Discharge: 2014-05-28 | Disposition: A | Discharge status: Home / Self Care | Payer: Medicare Other | Source: Ambulatory Visit | Attending: Radiation Oncology | Admitting: Radiation Oncology

## 2014-05-28 ENCOUNTER — Ambulatory Visit (HOSPITAL_BASED_OUTPATIENT_CLINIC_OR_DEPARTMENT_OTHER): Payer: Medicare Other

## 2014-05-28 ENCOUNTER — Other Ambulatory Visit (HOSPITAL_BASED_OUTPATIENT_CLINIC_OR_DEPARTMENT_OTHER): Payer: Medicare Other

## 2014-05-28 DIAGNOSIS — Z5112 Encounter for antineoplastic immunotherapy: Secondary | ICD-10-CM

## 2014-05-28 DIAGNOSIS — Z510 Encounter for antineoplastic radiation therapy: Secondary | ICD-10-CM | POA: Diagnosis not present

## 2014-05-28 DIAGNOSIS — C50412 Malignant neoplasm of upper-outer quadrant of left female breast: Secondary | ICD-10-CM

## 2014-05-28 LAB — COMPREHENSIVE METABOLIC PANEL (CC13)
ALK PHOS: 107 U/L (ref 40–150)
ALT: 11 U/L (ref 0–55)
ANION GAP: 10 meq/L (ref 3–11)
AST: 16 U/L (ref 5–34)
Albumin: 3.8 g/dL (ref 3.5–5.0)
BILIRUBIN TOTAL: 0.66 mg/dL (ref 0.20–1.20)
BUN: 15.2 mg/dL (ref 7.0–26.0)
CO2: 27 meq/L (ref 22–29)
CREATININE: 0.8 mg/dL (ref 0.6–1.1)
Calcium: 9.8 mg/dL (ref 8.4–10.4)
Chloride: 108 mEq/L (ref 98–109)
GLUCOSE: 82 mg/dL (ref 70–140)
Potassium: 3.6 mEq/L (ref 3.5–5.1)
SODIUM: 145 meq/L (ref 136–145)
TOTAL PROTEIN: 6.9 g/dL (ref 6.4–8.3)

## 2014-05-28 LAB — CBC WITH DIFFERENTIAL/PLATELET
BASO%: 1 % (ref 0.0–2.0)
Basophils Absolute: 0.1 10*3/uL (ref 0.0–0.1)
EOS ABS: 0.1 10*3/uL (ref 0.0–0.5)
EOS%: 2.5 % (ref 0.0–7.0)
HEMATOCRIT: 38.2 % (ref 34.8–46.6)
HGB: 12.2 g/dL (ref 11.6–15.9)
LYMPH%: 21.9 % (ref 14.0–49.7)
MCH: 27.9 pg (ref 25.1–34.0)
MCHC: 31.9 g/dL (ref 31.5–36.0)
MCV: 87.2 fL (ref 79.5–101.0)
MONO#: 0.3 10*3/uL (ref 0.1–0.9)
MONO%: 6.4 % (ref 0.0–14.0)
NEUT%: 68.2 % (ref 38.4–76.8)
NEUTROS ABS: 3.5 10*3/uL (ref 1.5–6.5)
PLATELETS: 198 10*3/uL (ref 145–400)
RBC: 4.38 10*6/uL (ref 3.70–5.45)
RDW: 13.5 % (ref 11.2–14.5)
WBC: 5.2 10*3/uL (ref 3.9–10.3)
lymph#: 1.1 10*3/uL (ref 0.9–3.3)

## 2014-05-28 MED ORDER — DIPHENHYDRAMINE HCL 25 MG PO CAPS
ORAL_CAPSULE | ORAL | Status: AC
  Filled 2014-05-28: qty 1

## 2014-05-28 MED ORDER — TRASTUZUMAB CHEMO INJECTION 440 MG
6.0000 mg/kg | Freq: Once | INTRAVENOUS | Status: AC
  Administered 2014-05-28: 609 mg via INTRAVENOUS
  Filled 2014-05-28: qty 29

## 2014-05-28 MED ORDER — ACETAMINOPHEN 325 MG PO TABS
650.0000 mg | ORAL_TABLET | Freq: Once | ORAL | Status: AC
  Administered 2014-05-28: 650 mg via ORAL

## 2014-05-28 MED ORDER — DIPHENHYDRAMINE HCL 25 MG PO CAPS
25.0000 mg | ORAL_CAPSULE | Freq: Once | ORAL | Status: AC
  Administered 2014-05-28: 25 mg via ORAL

## 2014-05-28 MED ORDER — HEPARIN SOD (PORK) LOCK FLUSH 100 UNIT/ML IV SOLN
500.0000 [IU] | Freq: Once | INTRAVENOUS | Status: AC | PRN
  Administered 2014-05-28: 500 [IU]
  Filled 2014-05-28: qty 5

## 2014-05-28 MED ORDER — SODIUM CHLORIDE 0.9 % IV SOLN
Freq: Once | INTRAVENOUS | Status: AC
  Administered 2014-05-28: 09:00:00 via INTRAVENOUS

## 2014-05-28 MED ORDER — SODIUM CHLORIDE 0.9 % IJ SOLN
10.0000 mL | INTRAMUSCULAR | Status: DC | PRN
  Administered 2014-05-28: 10 mL
  Filled 2014-05-28: qty 10

## 2014-05-28 MED ORDER — ACETAMINOPHEN 325 MG PO TABS
ORAL_TABLET | ORAL | Status: AC
  Filled 2014-05-28: qty 2

## 2014-05-28 NOTE — Patient Instructions (Signed)
[facility] Discharge Instructions for Patients Receiving Chemotherapy  Today you received the following chemotherapy agents Herceptin  To help prevent nausea and vomiting after your treatment, we encourage you to take your nausea medication     If you develop nausea and vomiting that is not controlled by your nausea medication, call the clinic.   BELOW ARE SYMPTOMS THAT SHOULD BE REPORTED IMMEDIATELY:  *FEVER GREATER THAN 100.5 F  *CHILLS WITH OR WITHOUT FEVER  NAUSEA AND VOMITING THAT IS NOT CONTROLLED WITH YOUR NAUSEA MEDICATION  *UNUSUAL SHORTNESS OF BREATH  *UNUSUAL BRUISING OR BLEEDING  TENDERNESS IN MOUTH AND THROAT WITH OR WITHOUT PRESENCE OF ULCERS  *URINARY PROBLEMS  *BOWEL PROBLEMS  UNUSUAL RASH Items with * indicate a potential emergency and should be followed up as soon as possible.  Feel free to call the clinic you have any questions or concerns. The clinic phone number is [phone].    

## 2014-05-29 ENCOUNTER — Ambulatory Visit
Admission: RE | Admit: 2014-05-29 | Discharge: 2014-05-29 | Disposition: A | Discharge status: Home / Self Care | Payer: Medicare Other | Source: Ambulatory Visit | Attending: Radiation Oncology | Admitting: Radiation Oncology

## 2014-05-29 ENCOUNTER — Encounter: Payer: Self-pay | Admitting: Radiation Oncology

## 2014-05-29 VITALS — BP 131/67 | HR 83 | Temp 97.9°F | Resp 20 | Wt 208.9 lb

## 2014-05-29 DIAGNOSIS — Z510 Encounter for antineoplastic radiation therapy: Secondary | ICD-10-CM | POA: Diagnosis not present

## 2014-05-29 DIAGNOSIS — C50412 Malignant neoplasm of upper-outer quadrant of left female breast: Secondary | ICD-10-CM

## 2014-05-29 NOTE — Progress Notes (Signed)
Patient denies pain, loss of appetite. She is more fatigued, napping more during the day. She continues to exercise. She is applying Radiaplex to left breast for hyperpigmentation, no desquamation noted today. She reports itching in upper breast area and around towards her axilla. Advised she may apply Cortisone cream 1 % to those areas but not underneath her breast.

## 2014-05-29 NOTE — Progress Notes (Signed)
Weekly Management Note Current Dose: 36  Gy  Projected Dose: 61 Gy   Narrative:  The patient presents for routine under treatment assessment.  CBCT/MVCT images/Port film x-rays were reviewed.  The chart was checked.Patient denies pain, loss of appetite. She is more fatigued, napping more during the day. She continues to exercise. She is applying Radiaplex to left breast for hyperpigmentation, no desquamation noted today. She reports itching in upper breast area and around towards her axilla.   Physical Findings: Weight: 208 lb 14.4 oz (94.756 kg). Dermatitis medially around axilla  Impression:  The patient is tolerating radiation.  Plan:  Continue treatment as planned. Add hydrocortisone. Continue radiaplex

## 2014-05-30 ENCOUNTER — Ambulatory Visit
Admission: RE | Admit: 2014-05-30 | Discharge: 2014-05-30 | Disposition: A | Discharge status: Home / Self Care | Payer: Medicare Other | Source: Ambulatory Visit | Attending: Radiation Oncology | Admitting: Radiation Oncology

## 2014-05-30 DIAGNOSIS — Z510 Encounter for antineoplastic radiation therapy: Secondary | ICD-10-CM | POA: Diagnosis not present

## 2014-06-01 ENCOUNTER — Ambulatory Visit: Payer: Medicare Other

## 2014-06-04 ENCOUNTER — Ambulatory Visit
Admission: RE | Admit: 2014-06-04 | Discharge: 2014-06-04 | Disposition: A | Discharge status: Home / Self Care | Payer: Medicare Other | Source: Ambulatory Visit | Attending: Radiation Oncology | Admitting: Radiation Oncology

## 2014-06-04 DIAGNOSIS — Z510 Encounter for antineoplastic radiation therapy: Secondary | ICD-10-CM | POA: Diagnosis not present

## 2014-06-05 ENCOUNTER — Ambulatory Visit
Admission: RE | Admit: 2014-06-05 | Discharge: 2014-06-05 | Disposition: A | Discharge status: Home / Self Care | Payer: Medicare Other | Source: Ambulatory Visit | Attending: Radiation Oncology | Admitting: Radiation Oncology

## 2014-06-05 DIAGNOSIS — C50412 Malignant neoplasm of upper-outer quadrant of left female breast: Secondary | ICD-10-CM

## 2014-06-05 DIAGNOSIS — Z510 Encounter for antineoplastic radiation therapy: Secondary | ICD-10-CM | POA: Diagnosis not present

## 2014-06-05 NOTE — Progress Notes (Signed)
Weekly Management Note Current Dose:  41.4 Gy  Projected Dose: 61 Gy   Narrative:  The patient presents for routine under treatment assessment.  CBCT/MVCT images/Port film x-rays were reviewed.  The chart was checked. Saw on tx. 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 Feeling well. Napping and using radiaplex. No complaints.   Physical Findings: Minimal skin darkening. No dermatitis.   Impression:  The patient is tolerating radiation.  Plan:  Continue treatment as planned. Continue radiaplex. OK to treat electrons.

## 2014-06-06 ENCOUNTER — Ambulatory Visit
Admission: RE | Admit: 2014-06-06 | Discharge: 2014-06-06 | Disposition: A | Discharge status: Home / Self Care | Payer: Medicare Other | Source: Ambulatory Visit | Attending: Radiation Oncology | Admitting: Radiation Oncology

## 2014-06-06 DIAGNOSIS — Z510 Encounter for antineoplastic radiation therapy: Secondary | ICD-10-CM | POA: Diagnosis not present

## 2014-06-07 ENCOUNTER — Ambulatory Visit
Admission: RE | Admit: 2014-06-07 | Discharge: 2014-06-07 | Disposition: A | Discharge status: Home / Self Care | Payer: Medicare Other | Source: Ambulatory Visit | Attending: Radiation Oncology | Admitting: Radiation Oncology

## 2014-06-07 ENCOUNTER — Telehealth: Payer: Self-pay | Admitting: *Deleted

## 2014-06-07 ENCOUNTER — Telehealth: Payer: Self-pay | Admitting: Adult Health

## 2014-06-07 DIAGNOSIS — Z510 Encounter for antineoplastic radiation therapy: Secondary | ICD-10-CM | POA: Diagnosis not present

## 2014-06-07 NOTE — Telephone Encounter (Signed)
pt came in to say appt was moved and herceptin not moved with sch nor sch remaining trmts-sent MW email to sch-pt stated will look @ [place] for sch

## 2014-06-07 NOTE — Telephone Encounter (Signed)
Per staff message and POF I have scheduled appts. Advised scheduler of appts. JMW  

## 2014-06-08 ENCOUNTER — Ambulatory Visit
Admission: RE | Admit: 2014-06-08 | Discharge: 2014-06-08 | Disposition: A | Discharge status: Home / Self Care | Payer: Medicare Other | Source: Ambulatory Visit | Attending: Radiation Oncology | Admitting: Radiation Oncology

## 2014-06-08 DIAGNOSIS — Z510 Encounter for antineoplastic radiation therapy: Secondary | ICD-10-CM | POA: Diagnosis not present

## 2014-06-11 ENCOUNTER — Ambulatory Visit
Admission: RE | Admit: 2014-06-11 | Discharge: 2014-06-11 | Disposition: A | Discharge status: Home / Self Care | Payer: Medicare Other | Source: Ambulatory Visit | Attending: Radiation Oncology | Admitting: Radiation Oncology

## 2014-06-11 DIAGNOSIS — Z510 Encounter for antineoplastic radiation therapy: Secondary | ICD-10-CM | POA: Diagnosis not present

## 2014-06-12 ENCOUNTER — Ambulatory Visit
Admission: RE | Admit: 2014-06-12 | Discharge: 2014-06-12 | Disposition: A | Discharge status: Home / Self Care | Payer: Medicare Other | Source: Ambulatory Visit | Attending: Radiation Oncology | Admitting: Radiation Oncology

## 2014-06-12 ENCOUNTER — Encounter: Payer: Self-pay | Admitting: Radiation Oncology

## 2014-06-12 VITALS — BP 134/64 | HR 76 | Temp 98.4°F | Resp 16 | Ht 68.0 in | Wt 208.1 lb

## 2014-06-12 DIAGNOSIS — C50412 Malignant neoplasm of upper-outer quadrant of left female breast: Secondary | ICD-10-CM

## 2014-06-12 DIAGNOSIS — Z510 Encounter for antineoplastic radiation therapy: Secondary | ICD-10-CM | POA: Diagnosis not present

## 2014-06-12 NOTE — Progress Notes (Signed)
April Walker has completed 28 fractions to her left breast.  She denies pain except she does have occasional sharp pains in her left breast that last for a few seconds.  She reports fatigue and is taking naps. She has hyperpigmentation on her left breast.  She is using Radiaplex twice a day.  She is going to have a herceptin injection on Friday.

## 2014-06-12 NOTE — Progress Notes (Signed)
Weekly Management Note Current Dose: 51  Gy  Projected Dose: 61 Gy   Narrative:  The patient presents for routine under treatment assessment.  CBCT/MVCT images/Port film x-rays were reviewed.  The chart was checked. Doing well. No complaints. Still taking naps and using radiaplex. Sharp zaps f pain continue occasionally.   Physical Findings: Weight: 208 lb 1.6 oz (94.394 kg). Unchanged. Hyperpigmented. Skin intact.   Impression:  The patient is tolerating radiation.  Plan:  Continue treatment as planned. Continue radiaplex.

## 2014-06-13 ENCOUNTER — Ambulatory Visit
Admission: RE | Admit: 2014-06-13 | Discharge: 2014-06-13 | Disposition: A | Discharge status: Home / Self Care | Payer: Medicare Other | Source: Ambulatory Visit | Attending: Radiation Oncology | Admitting: Radiation Oncology

## 2014-06-13 DIAGNOSIS — Z510 Encounter for antineoplastic radiation therapy: Secondary | ICD-10-CM | POA: Diagnosis not present

## 2014-06-14 ENCOUNTER — Ambulatory Visit
Admission: RE | Admit: 2014-06-14 | Discharge: 2014-06-14 | Disposition: A | Discharge status: Home / Self Care | Payer: Medicare Other | Source: Ambulatory Visit | Attending: Radiation Oncology | Admitting: Radiation Oncology

## 2014-06-14 ENCOUNTER — Other Ambulatory Visit: Payer: Self-pay | Admitting: Nurse Practitioner

## 2014-06-14 DIAGNOSIS — C50412 Malignant neoplasm of upper-outer quadrant of left female breast: Secondary | ICD-10-CM

## 2014-06-14 DIAGNOSIS — Z510 Encounter for antineoplastic radiation therapy: Secondary | ICD-10-CM | POA: Diagnosis not present

## 2014-06-15 ENCOUNTER — Other Ambulatory Visit (HOSPITAL_BASED_OUTPATIENT_CLINIC_OR_DEPARTMENT_OTHER): Payer: Medicare Other

## 2014-06-15 ENCOUNTER — Ambulatory Visit
Admission: RE | Admit: 2014-06-15 | Discharge: 2014-06-15 | Disposition: A | Discharge status: Home / Self Care | Payer: Medicare Other | Source: Ambulatory Visit | Attending: Radiation Oncology | Admitting: Radiation Oncology

## 2014-06-15 ENCOUNTER — Telehealth: Payer: Self-pay | Admitting: Nurse Practitioner

## 2014-06-15 ENCOUNTER — Ambulatory Visit (HOSPITAL_BASED_OUTPATIENT_CLINIC_OR_DEPARTMENT_OTHER): Payer: Medicare Other | Admitting: Nurse Practitioner

## 2014-06-15 ENCOUNTER — Encounter: Payer: Self-pay | Admitting: Nurse Practitioner

## 2014-06-15 ENCOUNTER — Ambulatory Visit (HOSPITAL_BASED_OUTPATIENT_CLINIC_OR_DEPARTMENT_OTHER): Payer: Medicare Other

## 2014-06-15 VITALS — BP 131/59 | HR 74 | Temp 98.1°F | Resp 18 | Ht 68.0 in | Wt 205.8 lb

## 2014-06-15 DIAGNOSIS — C50412 Malignant neoplasm of upper-outer quadrant of left female breast: Secondary | ICD-10-CM

## 2014-06-15 DIAGNOSIS — M8580 Other specified disorders of bone density and structure, unspecified site: Secondary | ICD-10-CM

## 2014-06-15 DIAGNOSIS — Z510 Encounter for antineoplastic radiation therapy: Secondary | ICD-10-CM | POA: Diagnosis not present

## 2014-06-15 DIAGNOSIS — Z5112 Encounter for antineoplastic immunotherapy: Secondary | ICD-10-CM

## 2014-06-15 LAB — COMPREHENSIVE METABOLIC PANEL (CC13)
ALT: 15 U/L (ref 0–55)
ANION GAP: 10 meq/L (ref 3–11)
AST: 18 U/L (ref 5–34)
Albumin: 3.9 g/dL (ref 3.5–5.0)
Alkaline Phosphatase: 109 U/L (ref 40–150)
BILIRUBIN TOTAL: 0.43 mg/dL (ref 0.20–1.20)
BUN: 16.2 mg/dL (ref 7.0–26.0)
CO2: 27 meq/L (ref 22–29)
CREATININE: 0.7 mg/dL (ref 0.6–1.1)
Calcium: 9.4 mg/dL (ref 8.4–10.4)
Chloride: 106 mEq/L (ref 98–109)
EGFR: 85 mL/min/{1.73_m2} — AB (ref >90)
GLUCOSE: 98 mg/dL (ref 70–140)
Potassium: 4 mEq/L (ref 3.5–5.1)
Sodium: 143 mEq/L (ref 136–145)
Total Protein: 6.9 g/dL (ref 6.4–8.3)

## 2014-06-15 LAB — CBC WITH DIFFERENTIAL/PLATELET
BASO%: 0.9 % (ref 0.0–2.0)
BASOS ABS: 0 10*3/uL (ref 0.0–0.1)
EOS%: 2.3 % (ref 0.0–7.0)
Eosinophils Absolute: 0.1 10*3/uL (ref 0.0–0.5)
HEMATOCRIT: 37.3 % (ref 34.8–46.6)
HGB: 12 g/dL (ref 11.6–15.9)
LYMPH%: 22.9 % (ref 14.0–49.7)
MCH: 27.3 pg (ref 25.1–34.0)
MCHC: 32.2 g/dL (ref 31.5–36.0)
MCV: 84.8 fL (ref 79.5–101.0)
MONO#: 0.4 10*3/uL (ref 0.1–0.9)
MONO%: 7.4 % (ref 0.0–14.0)
NEUT#: 3.5 10*3/uL (ref 1.5–6.5)
NEUT%: 66.5 % (ref 38.4–76.8)
Platelets: 212 10*3/uL (ref 145–400)
RBC: 4.4 10*6/uL (ref 3.70–5.45)
RDW: 14.7 % — ABNORMAL HIGH (ref 11.2–14.5)
WBC: 5.3 10*3/uL (ref 3.9–10.3)
lymph#: 1.2 10*3/uL (ref 0.9–3.3)

## 2014-06-15 MED ORDER — TRASTUZUMAB CHEMO INJECTION 440 MG
6.0000 mg/kg | Freq: Once | INTRAVENOUS | Status: AC
  Administered 2014-06-15: 609 mg via INTRAVENOUS
  Filled 2014-06-15: qty 29

## 2014-06-15 MED ORDER — ACETAMINOPHEN 325 MG PO TABS
ORAL_TABLET | ORAL | Status: AC
  Filled 2014-06-15: qty 2

## 2014-06-15 MED ORDER — DIPHENHYDRAMINE HCL 25 MG PO CAPS
ORAL_CAPSULE | ORAL | Status: AC
  Filled 2014-06-15: qty 1

## 2014-06-15 MED ORDER — DIPHENHYDRAMINE HCL 25 MG PO CAPS
25.0000 mg | ORAL_CAPSULE | Freq: Once | ORAL | Status: AC
  Administered 2014-06-15: 25 mg via ORAL

## 2014-06-15 MED ORDER — SODIUM CHLORIDE 0.9 % IJ SOLN
10.0000 mL | INTRAMUSCULAR | Status: DC | PRN
  Administered 2014-06-15: 10 mL
  Filled 2014-06-15: qty 10

## 2014-06-15 MED ORDER — SODIUM CHLORIDE 0.9 % IV SOLN
Freq: Once | INTRAVENOUS | Status: AC
  Administered 2014-06-15: 13:00:00 via INTRAVENOUS

## 2014-06-15 MED ORDER — ACETAMINOPHEN 325 MG PO TABS
650.0000 mg | ORAL_TABLET | Freq: Once | ORAL | Status: AC
  Administered 2014-06-15: 650 mg via ORAL

## 2014-06-15 MED ORDER — HEPARIN SOD (PORK) LOCK FLUSH 100 UNIT/ML IV SOLN
500.0000 [IU] | Freq: Once | INTRAVENOUS | Status: AC | PRN
  Administered 2014-06-15: 500 [IU]
  Filled 2014-06-15: qty 5

## 2014-06-15 NOTE — Patient Instructions (Signed)
[facility] Discharge Instructions for Patients Receiving Chemotherapy  Today you received the following chemotherapy agents herceptin   To help prevent nausea and vomiting after your treatment, we encourage you to take your nausea medication as directed   If you develop nausea and vomiting that is not controlled by your nausea medication, call the clinic.   BELOW ARE SYMPTOMS THAT SHOULD BE REPORTED IMMEDIATELY:  *FEVER GREATER THAN 100.5 F  *CHILLS WITH OR WITHOUT FEVER  NAUSEA AND VOMITING THAT IS NOT CONTROLLED WITH YOUR NAUSEA MEDICATION  *UNUSUAL SHORTNESS OF BREATH  *UNUSUAL BRUISING OR BLEEDING  TENDERNESS IN MOUTH AND THROAT WITH OR WITHOUT PRESENCE OF ULCERS  *URINARY PROBLEMS  *BOWEL PROBLEMS  UNUSUAL RASH Items with * indicate a potential emergency and should be followed up as soon as possible.  Feel free to call the clinic you have any questions or concerns. The clinic phone number is [phone].  

## 2014-06-15 NOTE — Progress Notes (Signed)
[place] [facility]  [phone] Fax:(336) [phone]     ID: [name] OB: 05-29-1951  MR#: [id number]  RAX#:[id number]  PCP: April Peaches, MD GYN:  [name] SU: [name]  OTHER MD: [name], [name], [name]  CHIEF COMPLAINT: Left Breast Cancer, locally advanced TREATMENT: adjuvant trastuzumab,  radiation    BREAST CANCER HISTORY: From the original integument:  [name] had routine screening mammography at [name] 09/14/2013. This showed a possible mass in the left breast. On 10/11/2013 she underwent left diagnostic mammography and ultrasonography which confirmed an irregular mass in the upper outer left breast measuring up to 3 cm. This was firm and fixed at the 2:30 o'clock position, 6 cm from the nipple. Ultrasound showed an irregular hypoechoic mass measuring 2.8 cm. The left axilla showed no evidence of adenopathy.  On 10/11/2013 the patient underwent left breast biopsy, with the pathology [name]) showing an invasive ductal carcinoma, high-grade, estrogen receptor 100% positive, progesterone receptor 43% positive, both with strong staining intensity, with an MIB-1 of 87% and HER-2 amplified, the signals ratio being 2.72, the number per cell 3.40.  On 10/23/2013 the patient underwent bilateral breast MRI. This showed the breast and position to be density be. In the left breast there was a 3 cm irregular enhancing mass in the upper outer quadrant. There was a 9 mm oval mass in the inner lower left breast with no definite fatty hilum. There was a 1.4 cm upper left axillary lymph node with a thickened cortex. There were no other abnormal appearing lymph nodes. Ultrasound of the left breast 10/30/2013 found of the left axillary lymph node with the mildly asymmetrical cortex, and a 7 mm her mass in the 7:30 o'clock position of the left breast. Biopsy of both these suspicious areas was performed 10/30/2013, and the preliminary  report is that they're both benign.  The patient's subsequent history is as detailed below  INTERVAL HISTORY: [name] returns today for follow up of her locally advanced left breast carcinoma. She is due for trastuzumab today. She continues with daily radiation. Her last treatment is scheduled for this upcoming Tuesday. She is handling both treatment well with few complaints. Fatigue effects her the most, but it is manageable. She has shooting breast pains on occasion. She complain of sinus symptoms and plans to restart her daily claritin. She denies fevers, chills, nausea, or vomiting. She has some constipation but plans to manage this on her own. Her appetite is healthy and she has not had any weight loss. She denies mouth sores, rashes, headaches, or dizziness. She has no shortness of breath, chest pain, cough, or palpitations.   REVIEW OF SYSTEMS: A detailed review of systems was conducted and is otherwise negative except for what is noted above.    PAST MEDICAL HISTORY: Past Medical History  Diagnosis Date  . Abnormal cholesterol test   . Hyperlipidemia   . Knee pain, left   . GERD (gastroesophageal reflux disease)   . Arthritis   . Tubulovillous adenoma of colon   . Diverticulosis   . Internal hemorrhoids   . Wears glasses   . Hearing deficit     no hearing aids    PAST SURGICAL HISTORY: Past Surgical History  Procedure Laterality Date  . Tonsillectomy and adenoidectomy    . Right knee arthroscopy  2011 [name]  . Treatment fistula anal    . Upper gastrointestinal endoscopy    . Hemorrhoid surgery    .  Colonoscopy  2002    hemorrhoids  . Colonoscopy w/ polypectomy  12/09/2010    1 cm sigmoid polyp - , diverticulosis, hemorrhoids  . Knee surgery    . Knee surgery  2012    Left knee arthroscopy  . Total knee arthroplasty  11/23/2011    Procedure: TOTAL KNEE ARTHROPLASTY;  Surgeon: April Civil, MD;  Location: [facility];  Service: Orthopedics;  Laterality: Left;  .  Portacath placement Left 11/17/2013    Procedure: INSERTION PORT-A-CATH;  Surgeon: April Klein, MD;  Location: [facility];  Service: General;  Laterality: Left;  . Partial mastectomy with needle localization and axillary sentinel lymph node bx Left 03/21/14    [name]    FAMILY HISTORY Family History  Problem Relation Age of Onset  . Adopted: Yes  . Stroke Mother   . Heart attack Father    the patient is adopted and has no information on her biological family. the patient's adoptive father died at the age of 60 from a myocardial infarction; productive mother died from a stroke at age 32. The patient does not know of any siblings  GYNECOLOGIC HISTORY: (Reviewed 12/18/2013) Menarche age 88. The patient is GX P0. She underwent menopause in her early 23s and took hormone replacement until 2010.  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 She is single, and lives at home with her black lab, [name], and [name]    ADVANCED DIRECTIVES: Not in place   HEALTH MAINTENANCE:  (Reviewed 12/18/2013) History  Substance Use Topics  . Smoking status: Never Smoker   . Smokeless tobacco: Never Used  . Alcohol Use: Yes     Comment: socially     Colonoscopy: 2012/[name]  PAP: 2014/[name]  Bone density: not on file  Lipid panel: not on file   Allergies  Allergen Reactions  . Sulfonamide Derivatives Anaphylaxis and Hives    Pt. Stated, "large welps on neck immediately."  . Triprolidine-Pseudoephedrine Other (See Comments)    This is Actifed. Increased heart rate to tachycardia.    Current Outpatient Prescriptions  Medication Sig Dispense Refill  . Acetaminophen (TYLENOL EXTRA STRENGTH PO) Take by mouth.    . loratadine (CLARITIN) 10 MG tablet Take 10 mg by mouth daily as needed. For allergies    . simvastatin (ZOCOR) 10 MG tablet Take 10 mg by mouth daily. Pt  TAKES AT BEDTIME    . acyclovir (ZOVIRAX) 400 MG tablet Take 1 tablet (400 mg total) by mouth 2 (two) times daily. (Patient not taking: Reported on 06/12/2014) 60 tablet 2  . furosemide (LASIX) 20 MG tablet Take 1 tablet (20 mg total) by mouth daily as needed. (Patient not taking: Reported on 06/12/2014) 30 tablet 3  . hydrocortisone (ANUSOL-HC) 25 MG suppository Place 1 suppository (25 mg total) rectally at bedtime as needed for hemorrhoids or itching. (Patient not taking: Reported on 06/12/2014) 10 suppository 0  . omeprazole (PRILOSEC) 20 MG capsule Take 20 mg by mouth as needed.    Marland Kitchen oxyCODONE-acetaminophen (ROXICET) 5-325 MG per tablet Take 1-2 tablets by mouth every 4 (four) hours as needed for severe pain. (Patient not taking: Reported on 06/12/2014) 30 tablet 0  . potassium chloride (K-DUR) 10 MEQ tablet Take 2 tablets (20 mEq total) by mouth daily as needed. (Patient not taking: Reported on 06/12/2014) 60 tablet 3   No current facility-administered medications for this  visit.   Facility-Administered Medications Ordered in Other Visits  Medication Dose Route Frequency Provider Last Rate Last Dose  . heparin lock flush 100 unit/mL  500 Units Intracatheter Once PRN Chauncey Cruel, MD      . sodium chloride 0.9 % injection 10 mL  10 mL Intracatheter PRN Chauncey Cruel, MD      . trastuzumab (HERCEPTIN) 609 mg in sodium chloride 0.9 % 250 mL chemo infusion  6 mg/kg (Treatment Plan Actual) Intravenous Once Chauncey Cruel, MD 558 mL/hr at 06/15/14 1306 609 mg at 06/15/14 1306    OBJECTIVE: Middle-aged white woman in no acute distress Filed Vitals:   06/15/14 1126  BP: 131/59  Pulse: 74  Temp: 98.1 F (36.7 C)  Resp: 18     Body mass index is 31.3 kg/(m^2).    ECOG FS:1 - Symptomatic but completely ambulatory Filed Weights   06/15/14 1126  Weight: 205 lb 12.8 oz (93.35 kg)   Skin: warm, dry  HEENT: sclerae anicteric, conjunctivae pink, oropharynx clear. No thrush or mucositis.   Lymph Nodes: No cervical or supraclavicular lymphadenopathy  Lungs: clear to auscultation bilaterally, no rales, wheezes, or rhonci  Heart: regular rate and rhythm  Abdomen: round, soft, non tender, positive bowel sounds  Musculoskeletal: No focal spinal tenderness, no peripheral edema  Neuro: non focal, well oriented, positive affect  Breasts: deferred  LAB RESULTS:   Lab Results  Component Value Date   WBC 5.3 06/15/2014   NEUTROABS 3.5 06/15/2014   HGB 12.0 06/15/2014   HCT 37.3 06/15/2014   MCV 84.8 06/15/2014   PLT 212 06/15/2014      Chemistry      Component Value Date/Time   NA 143 06/15/2014 1109   NA 143 03/21/2014 0801   K 4.0 06/15/2014 1109   K 3.2* 03/21/2014 0801   CL 108 03/21/2014 0801   CO2 27 06/15/2014 1109   CO2 25 02/12/2014 1003   BUN 16.2 06/15/2014 1109   BUN 8 03/21/2014 0801   CREATININE 0.7 06/15/2014 1109   CREATININE 0.60 03/21/2014 0801      Component Value Date/Time   CALCIUM 9.4 06/15/2014 1109   CALCIUM 10.0 02/12/2014 1003   ALKPHOS 109 06/15/2014 1109   ALKPHOS 92 02/12/2014 1003   AST 18 06/15/2014 1109   AST 16 02/12/2014 1003   ALT 15 06/15/2014 1109   ALT 16 02/12/2014 1003   BILITOT 0.43 06/15/2014 1109   BILITOT 0.4 02/12/2014 1003       STUDIES: Most recent echocardiogram on 05/08/14 showed an ejection fraction of 55%  ASSESSMENT: 63 y.o. [name] woman status post left breast biopsy 10/11/2013 for a clinical T2 N0, stage IIA invasive ductal carcinoma, grade 3, estrogen and progesterone receptor positive, with an MIB-1 of 87%, and HER-2 amplified  (a) additional Left breast and left axillary node biopsy 10/30/2013 benign  (1) started neoadjuvant therapy 11/20/2013 , consisting of carboplatin, docetaxel, trastuzumab and pertuzumab every 3 weeks x6 completed 03/05/2014   (2) trastuzumab alone to continue to complete a year; most recent echocardiogram 01/24/2014 shows a well preserved ejection fraction.  (3)  Patient underwent left lumpectomy with sentinel node biopsy on 03/21/14. Pathology results demonstrated a complete response, pT0 pN0  (4) radiation to follow surgery   (5) anti-estrogens to follow radiation (to begin 07/06/14)  PLAN: Andersyn is doing well today. The labs were reviewed in detail and were entirely stable. Her most recent echocardiogram showed a well preserved ejection fraction. She will  proceed with trastuzumab today. She is to begin anti-estrogen therapy after radiation is completed. As Dr. Jana Hakim was not available for consult, so we spent about 25 minutes discussing the various anti-estrogen options and common side effects for each drug. I provided her with a handout for review at home. We will obtain a bone density scan this month and I will approach Dr. Jana Hakim about whether he prefers to start her on tamoxifen or anastrozole and send this to her pharmacy. She will begin one of these drugs on January 1st and I will call her personally to review this decision.   Cidney will continue trastuzumab every 3 weeks and follow up with an office visit in February. Her next echocardiogram will be due that month. She understands and agrees with this plan. She knows the goal of treatment in her case is cure. She has been encouraged to call with any issues that might arise before her next visit here.   Marcelino Duster, NP  06/15/2014 1:09 PM

## 2014-06-15 NOTE — Telephone Encounter (Signed)
per pof to sch pt appt-per pot req to mail copy-mailed

## 2014-06-18 ENCOUNTER — Ambulatory Visit: Payer: Medicare Other | Admitting: Adult Health

## 2014-06-18 ENCOUNTER — Ambulatory Visit
Admission: RE | Admit: 2014-06-18 | Discharge: 2014-06-18 | Disposition: A | Discharge status: Home / Self Care | Payer: Medicare Other | Source: Ambulatory Visit | Attending: Radiation Oncology | Admitting: Radiation Oncology

## 2014-06-18 ENCOUNTER — Other Ambulatory Visit: Payer: Medicare Other

## 2014-06-18 DIAGNOSIS — Z510 Encounter for antineoplastic radiation therapy: Secondary | ICD-10-CM | POA: Diagnosis not present

## 2014-06-18 MED ORDER — ANASTROZOLE 1 MG PO TABS: 1.0000 mg | ORAL_TABLET | Freq: Every day | ORAL | Status: DC

## 2014-06-18 NOTE — Addendum Note (Signed)
Addended by: Marcelino Duster on: 06/18/2014 02:06 PM   Modules accepted: Orders

## 2014-06-19 ENCOUNTER — Ambulatory Visit
Admission: RE | Admit: 2014-06-19 | Discharge: 2014-06-19 | Disposition: A | Discharge status: Home / Self Care | Payer: Medicare Other | Source: Ambulatory Visit | Attending: Radiation Oncology | Admitting: Radiation Oncology

## 2014-06-19 ENCOUNTER — Encounter: Payer: Self-pay | Admitting: Radiation Oncology

## 2014-06-19 VITALS — BP 121/82 | HR 83 | Temp 98.2°F | Resp 16 | Wt 207.7 lb

## 2014-06-19 DIAGNOSIS — C50412 Malignant neoplasm of upper-outer quadrant of left female breast: Secondary | ICD-10-CM

## 2014-06-19 DIAGNOSIS — Z923 Personal history of irradiation: Secondary | ICD-10-CM | POA: Insufficient documentation

## 2014-06-19 DIAGNOSIS — Z510 Encounter for antineoplastic radiation therapy: Secondary | ICD-10-CM | POA: Diagnosis not present

## 2014-06-19 DIAGNOSIS — C50912 Malignant neoplasm of unspecified site of left female breast: Secondary | ICD-10-CM | POA: Diagnosis present

## 2014-06-19 MED ORDER — RADIAPLEXRX EX GEL
Freq: Once | CUTANEOUS | Status: AC
  Administered 2014-06-19: 14:00:00 via TOPICAL

## 2014-06-19 NOTE — Addendum Note (Signed)
Encounter addended by: [name], MD on: 06/19/2014  3:07 PM<BR>     Documentation filed: Notes Section

## 2014-06-19 NOTE — Progress Notes (Signed)
Left  breast rad tx 33/33 completed, erythema on breast, using radiaplex bid, skin intact, phantom pains occasionally in breast, gave 2nd tube radiaplex gel, has decided to join [facility] in January received letter in mail, will continue radiaplex 2 weeks then follow with lotion with vitamin e, slight fatigue at times, appetite good, 1 month f/u appt card given 1:49 PM

## 2014-06-19 NOTE — Progress Notes (Addendum)
  Radiation Oncology         [phone]) [phone] ________________________________  Name: April Walker MRN: [id number]  Date: 06/19/2014  DOB: 07/27/1950  End of Treatment Note  Diagnosis:   T1cN0 Invasive Ductal Carcinoma of the Left Breast     Indication for treatment:  Curative       Radiation treatment dates:   05/02/14-06/19/14  Site/dose:   Site/dose:   Left breast/ 45 Gy at 1.8 Gy per fraction x 25 fractions.  Left breast boost/ 16 Gy at 2 Gy per fraction x 8 fractions  Beams/energy:  Opposed tangents with reduced fields / 6 MV photons Enface electrons / 15 MeV electrons  Narrative: The patient tolerated radiation treatment relatively well.   She had minimal skin changes and some fatigue.  Plan: The patient has completed radiation treatment. The patient will return to radiation oncology clinic for routine followup in one month. I advised them to call or return sooner if they have any questions or concerns related to their recovery or treatment.  ------------------------------------------------  [name], MD

## 2014-06-20 ENCOUNTER — Other Ambulatory Visit: Payer: Medicare Other

## 2014-06-20 ENCOUNTER — Ambulatory Visit: Payer: Medicare Other

## 2014-06-23 NOTE — Progress Notes (Signed)
Name: April Walker   MRN: [id number]  Date:  06/05/14   DOB: Oct 16, 1950  Status:outpatient    DIAGNOSIS: Left breast cancer  CONSENT VERIFIED: yes   SET UP: Patient is setup supine   IMMOBILIZATION:  The following immobilization was used:Custom Moldable Pillow, breast board.   NARRATIVE: April Walker underwent complex simulation and treatment planning for her boost treatment today.  Her tumor volume was outlined on the planning CT scan. The depth of her cavity was felt to be appropriate for treatment with electrons    15  MeV electrons will be prescribed to the 100% isodose line.   I personally oversaw and approved the construction of a unique block which will be used for beam modification purposes.  A special port plan is requested.

## 2014-06-23 NOTE — Progress Notes (Signed)
Name: April Walker   MRN: [id number]  Date:  04/24/14  DOB: 1950-09-20  Status:outpatient   DIAGNOSIS: Left Breast cancer.  CONSENT VERIFIED: yes SET UP: Patient is setup supine  IMMOBILIZATION:  The following immobilization was used:Custom Moldable Pillow, breast board.  NARRATIVE: [name] was brought to the [facility].  Identity was confirmed.  All relevant records and images related to the planned course of therapy were reviewed.  Then, the patient was positioned in a stable reproducible clinical set-up for radiation therapy.  Wires were placed to delineate the clinical extent of breast tissue. A wire was placed on the scar as well.  CT images were obtained.  An isocenter was placed. Skin markings were placed.  The position of the heart was then analyzed.  Due to the proximity of the heart to the chest wall, I felt she would benefit from deep inspiration breath hold for cardiac sparing.  She was then coached and rescanned in the breath hold position.  Acceptable cardiac sparing was achieved. The CT images were loaded into the planning software where the target and avoidance structures were contoured.  The radiation prescription was entered and confirmed. The patient was discharged in stable condition and tolerated simulation well.    TREATMENT PLANNING NOTE/3D Simulation Note Treatment planning then occurred. I have requested : MLC's, isodose plan, basic dose calculation  3D simulation was performed.  I personally designed and supervised the construction of 3 medically necessary complex treatment devices in the form of MLCs which will be used for beam modification and to protect critical structures including the heart and lung as well as the immobilization device which is necessary for reproducible set up.  I have requested a dose volume histogram of the heart, lung and tumor cavity.    RESPIRATORY MOTION MANAGEMENT SIMULATION - Deep Inspiration Breath  Hold  NARRATIVE:  In order to account for effect of respiratory motion on target structures and other organs in the planning and delivery of radiotherapy, this patient underwent respiratory motion management simulation.  To accomplish this, when the patient was brought to the CT simulation planning suite, a bellows was placed on the her abdomen.  Wave forms of the patient's breathing were obtained. Coaching was performed and practice sessions initiated to monitor her ability to obtain and maintain deep inspiration breath hold.  The CT images were loaded into the planning software and fused with her free breathing images by physics.  Acceptable cardiac sparing was achieved through the use of deep inspiration breath hold.  Planning will be performed on her breath hold scan

## 2014-06-23 NOTE — Progress Notes (Signed)
Radiation Oncology         [phone]) [phone] ________________________________  Name: April Walker      MRN: [id number]          Date: 04/24/14              DOB: [birth year]  Optical Surface Tracking Plan:  Since intensity modulated radiotherapy (IMRT) and 3D conformal radiation treatment methods are predicated on accurate and precise positioning for treatment, intrafraction motion monitoring is medically necessary to ensure accurate and safe treatment delivery.  The ability to quantify intrafraction motion without excessive ionizing radiation dose can only be performed with optical surface tracking. Accordingly, surface imaging offers the opportunity to obtain 3D measurements of patient position throughout IMRT and 3D treatments without excessive radiation exposure.  I am ordering optical surface tracking for this patient's upcoming course of radiotherapy. ________________________________ Signature   Reference:   Ursula Alert, J, et al. Surface imaging-based analysis of intrafraction motion for breast radiotherapy patients.Journal of [place], n. 6, nov. 2014. ISSN [id number].   Available at: <http://www.jacmp.org/index.php/jacmp/article/view/4957>.

## 2014-07-03 ENCOUNTER — Ambulatory Visit
Admission: RE | Admit: 2014-07-03 | Discharge: 2014-07-03 | Disposition: A | Discharge status: Home / Self Care | Payer: Medicare Other | Source: Ambulatory Visit | Attending: Nurse Practitioner | Admitting: Nurse Practitioner

## 2014-07-03 DIAGNOSIS — M8580 Other specified disorders of bone density and structure, unspecified site: Secondary | ICD-10-CM

## 2014-07-04 ENCOUNTER — Other Ambulatory Visit: Payer: Self-pay | Admitting: *Deleted

## 2014-07-04 ENCOUNTER — Telehealth: Payer: Self-pay | Admitting: Adult Health

## 2014-07-04 DIAGNOSIS — C50412 Malignant neoplasm of upper-outer quadrant of left female breast: Secondary | ICD-10-CM

## 2014-07-04 NOTE — Telephone Encounter (Signed)
Scheduled patient for Survivorship Clinic visit on 07/26/14 at 2:30pm prior to her 28-month follow-up visit with Dr. Pablo Ledger.    Mike Craze, NP [facility] [phone]

## 2014-07-05 ENCOUNTER — Other Ambulatory Visit (HOSPITAL_BASED_OUTPATIENT_CLINIC_OR_DEPARTMENT_OTHER): Payer: Medicare Other

## 2014-07-05 ENCOUNTER — Other Ambulatory Visit: Payer: Self-pay | Admitting: Oncology

## 2014-07-05 ENCOUNTER — Ambulatory Visit (HOSPITAL_BASED_OUTPATIENT_CLINIC_OR_DEPARTMENT_OTHER): Payer: Medicare Other

## 2014-07-05 DIAGNOSIS — Z5112 Encounter for antineoplastic immunotherapy: Secondary | ICD-10-CM

## 2014-07-05 DIAGNOSIS — C50412 Malignant neoplasm of upper-outer quadrant of left female breast: Secondary | ICD-10-CM

## 2014-07-05 LAB — CBC WITH DIFFERENTIAL/PLATELET
BASO%: 1.1 % (ref 0.0–2.0)
BASOS ABS: 0.1 10*3/uL (ref 0.0–0.1)
EOS ABS: 0.1 10*3/uL (ref 0.0–0.5)
EOS%: 2.6 % (ref 0.0–7.0)
HEMATOCRIT: 37.3 % (ref 34.8–46.6)
HEMOGLOBIN: 11.9 g/dL (ref 11.6–15.9)
LYMPH#: 0.9 10*3/uL (ref 0.9–3.3)
LYMPH%: 18.6 % (ref 14.0–49.7)
MCH: 26.8 pg (ref 25.1–34.0)
MCHC: 31.8 g/dL (ref 31.5–36.0)
MCV: 84 fL (ref 79.5–101.0)
MONO#: 0.4 10*3/uL (ref 0.1–0.9)
MONO%: 8.2 % (ref 0.0–14.0)
NEUT#: 3.4 10*3/uL (ref 1.5–6.5)
NEUT%: 69.5 % (ref 38.4–76.8)
Platelets: 217 10*3/uL (ref 145–400)
RBC: 4.44 10*6/uL (ref 3.70–5.45)
RDW: 14.2 % (ref 11.2–14.5)
WBC: 4.9 10*3/uL (ref 3.9–10.3)

## 2014-07-05 LAB — COMPREHENSIVE METABOLIC PANEL (CC13)
ALT: 15 U/L (ref 0–55)
AST: 16 U/L (ref 5–34)
Albumin: 3.7 g/dL (ref 3.5–5.0)
Alkaline Phosphatase: 106 U/L (ref 40–150)
Anion Gap: 8 mEq/L (ref 3–11)
BILIRUBIN TOTAL: 0.58 mg/dL (ref 0.20–1.20)
BUN: 17.2 mg/dL (ref 7.0–26.0)
CALCIUM: 9.4 mg/dL (ref 8.4–10.4)
CO2: 28 mEq/L (ref 22–29)
CREATININE: 0.8 mg/dL (ref 0.6–1.1)
Chloride: 109 mEq/L (ref 98–109)
EGFR: 79 mL/min/{1.73_m2} — AB (ref >90)
Glucose: 84 mg/dl (ref 70–140)
Potassium: 3.5 mEq/L (ref 3.5–5.1)
Sodium: 145 mEq/L (ref 136–145)
Total Protein: 6.6 g/dL (ref 6.4–8.3)

## 2014-07-05 MED ORDER — DIPHENHYDRAMINE HCL 25 MG PO CAPS
ORAL_CAPSULE | ORAL | Status: AC
  Filled 2014-07-05: qty 1

## 2014-07-05 MED ORDER — SODIUM CHLORIDE 0.9 % IJ SOLN
10.0000 mL | INTRAMUSCULAR | Status: DC | PRN
  Administered 2014-07-05: 10 mL
  Filled 2014-07-05: qty 10

## 2014-07-05 MED ORDER — HEPARIN SOD (PORK) LOCK FLUSH 100 UNIT/ML IV SOLN
500.0000 [IU] | Freq: Once | INTRAVENOUS | Status: AC | PRN
  Administered 2014-07-05: 500 [IU]
  Filled 2014-07-05: qty 5

## 2014-07-05 MED ORDER — ACETAMINOPHEN 325 MG PO TABS
ORAL_TABLET | ORAL | Status: AC
  Filled 2014-07-05: qty 2

## 2014-07-05 MED ORDER — SODIUM CHLORIDE 0.9 % IV SOLN
Freq: Once | INTRAVENOUS | Status: AC
  Administered 2014-07-05: 11:00:00 via INTRAVENOUS

## 2014-07-05 MED ORDER — ACETAMINOPHEN 325 MG PO TABS
650.0000 mg | ORAL_TABLET | Freq: Once | ORAL | Status: AC
  Administered 2014-07-05: 650 mg via ORAL

## 2014-07-05 MED ORDER — DIPHENHYDRAMINE HCL 25 MG PO CAPS
25.0000 mg | ORAL_CAPSULE | Freq: Once | ORAL | Status: AC
  Administered 2014-07-05: 25 mg via ORAL

## 2014-07-05 MED ORDER — TRASTUZUMAB CHEMO INJECTION 440 MG
6.0000 mg/kg | Freq: Once | INTRAVENOUS | Status: AC
  Administered 2014-07-05: 609 mg via INTRAVENOUS
  Filled 2014-07-05: qty 29

## 2014-07-05 NOTE — Patient Instructions (Signed)
[facility] Discharge Instructions for Patients Receiving Chemotherapy  Today you received the following chemotherapy agents Herceptin  To help prevent nausea and vomiting after your treatment, we encourage you to take your nausea medication     If you develop nausea and vomiting that is not controlled by your nausea medication, call the clinic.   BELOW ARE SYMPTOMS THAT SHOULD BE REPORTED IMMEDIATELY:  *FEVER GREATER THAN 100.5 F  *CHILLS WITH OR WITHOUT FEVER  NAUSEA AND VOMITING THAT IS NOT CONTROLLED WITH YOUR NAUSEA MEDICATION  *UNUSUAL SHORTNESS OF BREATH  *UNUSUAL BRUISING OR BLEEDING  TENDERNESS IN MOUTH AND THROAT WITH OR WITHOUT PRESENCE OF ULCERS  *URINARY PROBLEMS  *BOWEL PROBLEMS  UNUSUAL RASH Items with * indicate a potential emergency and should be followed up as soon as possible.  Feel free to call the clinic you have any questions or concerns. The clinic phone number is [phone].    

## 2014-07-09 ENCOUNTER — Other Ambulatory Visit: Payer: Medicare Other

## 2014-07-20 ENCOUNTER — Other Ambulatory Visit: Payer: Self-pay | Admitting: *Deleted

## 2014-07-20 ENCOUNTER — Telehealth: Payer: Self-pay | Admitting: *Deleted

## 2014-07-20 NOTE — Telephone Encounter (Signed)
Have scheduled appts per old POF. Called and gave patient appt for Monday

## 2014-07-23 ENCOUNTER — Other Ambulatory Visit (HOSPITAL_BASED_OUTPATIENT_CLINIC_OR_DEPARTMENT_OTHER): Payer: Medicare Other

## 2014-07-23 ENCOUNTER — Other Ambulatory Visit: Payer: Self-pay | Admitting: Nurse Practitioner

## 2014-07-23 ENCOUNTER — Ambulatory Visit (HOSPITAL_BASED_OUTPATIENT_CLINIC_OR_DEPARTMENT_OTHER): Payer: Medicare Other

## 2014-07-23 DIAGNOSIS — C50412 Malignant neoplasm of upper-outer quadrant of left female breast: Secondary | ICD-10-CM

## 2014-07-23 DIAGNOSIS — Z5112 Encounter for antineoplastic immunotherapy: Secondary | ICD-10-CM

## 2014-07-23 LAB — CBC WITH DIFFERENTIAL/PLATELET
BASO%: 1.1 % (ref 0.0–2.0)
BASOS ABS: 0.1 10*3/uL (ref 0.0–0.1)
EOS%: 2.1 % (ref 0.0–7.0)
Eosinophils Absolute: 0.1 10*3/uL (ref 0.0–0.5)
HCT: 36.6 % (ref 34.8–46.6)
HEMOGLOBIN: 11.9 g/dL (ref 11.6–15.9)
LYMPH%: 20.7 % (ref 14.0–49.7)
MCH: 26.8 pg (ref 25.1–34.0)
MCHC: 32.5 g/dL (ref 31.5–36.0)
MCV: 82.2 fL (ref 79.5–101.0)
MONO#: 0.5 10*3/uL (ref 0.1–0.9)
MONO%: 7.3 % (ref 0.0–14.0)
NEUT%: 68.8 % (ref 38.4–76.8)
NEUTROS ABS: 4.5 10*3/uL (ref 1.5–6.5)
PLATELETS: 240 10*3/uL (ref 145–400)
RBC: 4.45 10*6/uL (ref 3.70–5.45)
RDW: 14.7 % — AB (ref 11.2–14.5)
WBC: 6.6 10*3/uL (ref 3.9–10.3)
lymph#: 1.4 10*3/uL (ref 0.9–3.3)

## 2014-07-23 LAB — COMPREHENSIVE METABOLIC PANEL (CC13)
ALBUMIN: 3.8 g/dL (ref 3.5–5.0)
ALK PHOS: 114 U/L (ref 40–150)
ALT: 13 U/L (ref 0–55)
ANION GAP: 10 meq/L (ref 3–11)
AST: 18 U/L (ref 5–34)
BILIRUBIN TOTAL: 0.4 mg/dL (ref 0.20–1.20)
BUN: 12.7 mg/dL (ref 7.0–26.0)
CHLORIDE: 107 meq/L (ref 98–109)
CO2: 28 mEq/L (ref 22–29)
Calcium: 9.1 mg/dL (ref 8.4–10.4)
Creatinine: 0.8 mg/dL (ref 0.6–1.1)
EGFR: 74 mL/min/{1.73_m2} — AB (ref >90)
Glucose: 97 mg/dl (ref 70–140)
Potassium: 3.8 mEq/L (ref 3.5–5.1)
SODIUM: 145 meq/L (ref 136–145)
Total Protein: 6.8 g/dL (ref 6.4–8.3)

## 2014-07-23 MED ORDER — TRASTUZUMAB CHEMO INJECTION 440 MG
6.0000 mg/kg | Freq: Once | INTRAVENOUS | Status: AC
  Administered 2014-07-23: 609 mg via INTRAVENOUS
  Filled 2014-07-23: qty 29

## 2014-07-23 MED ORDER — SODIUM CHLORIDE 0.9 % IJ SOLN
10.0000 mL | INTRAMUSCULAR | Status: DC | PRN
  Administered 2014-07-23: 10 mL
  Filled 2014-07-23: qty 10

## 2014-07-23 MED ORDER — ACETAMINOPHEN 325 MG PO TABS
ORAL_TABLET | ORAL | Status: AC
  Filled 2014-07-23: qty 2

## 2014-07-23 MED ORDER — HEPARIN SOD (PORK) LOCK FLUSH 100 UNIT/ML IV SOLN
500.0000 [IU] | Freq: Once | INTRAVENOUS | Status: AC | PRN
  Administered 2014-07-23: 500 [IU]
  Filled 2014-07-23: qty 5

## 2014-07-23 MED ORDER — DIPHENHYDRAMINE HCL 25 MG PO CAPS
25.0000 mg | ORAL_CAPSULE | Freq: Once | ORAL | Status: AC
  Administered 2014-07-23: 25 mg via ORAL

## 2014-07-23 MED ORDER — ACETAMINOPHEN 325 MG PO TABS
650.0000 mg | ORAL_TABLET | Freq: Once | ORAL | Status: AC
Start: 2014-07-23 — End: 2014-07-23
  Administered 2014-07-23: 650 mg via ORAL

## 2014-07-23 MED ORDER — DIPHENHYDRAMINE HCL 25 MG PO CAPS
ORAL_CAPSULE | ORAL | Status: AC
  Filled 2014-07-23: qty 1

## 2014-07-23 MED ORDER — SODIUM CHLORIDE 0.9 % IV SOLN
Freq: Once | INTRAVENOUS | Status: AC
  Administered 2014-07-23: 15:00:00 via INTRAVENOUS

## 2014-07-23 NOTE — Patient Instructions (Signed)
[facility] Discharge Instructions for Patients Receiving Chemotherapy  Today you received the following chemotherapy agents Herceptin  To help prevent nausea and vomiting after your treatment, we encourage you to take your nausea medication     If you develop nausea and vomiting that is not controlled by your nausea medication, call the clinic.   BELOW ARE SYMPTOMS THAT SHOULD BE REPORTED IMMEDIATELY:  *FEVER GREATER THAN 100.5 F  *CHILLS WITH OR WITHOUT FEVER  NAUSEA AND VOMITING THAT IS NOT CONTROLLED WITH YOUR NAUSEA MEDICATION  *UNUSUAL SHORTNESS OF BREATH  *UNUSUAL BRUISING OR BLEEDING  TENDERNESS IN MOUTH AND THROAT WITH OR WITHOUT PRESENCE OF ULCERS  *URINARY PROBLEMS  *BOWEL PROBLEMS  UNUSUAL RASH Items with * indicate a potential emergency and should be followed up as soon as possible.  Feel free to call the clinic you have any questions or concerns. The clinic phone number is [phone].    

## 2014-07-26 ENCOUNTER — Ambulatory Visit
Admission: RE | Admit: 2014-07-26 | Discharge: 2014-07-26 | Disposition: A | Discharge status: Home / Self Care | Payer: Medicare Other | Source: Ambulatory Visit | Attending: Radiation Oncology | Admitting: Radiation Oncology

## 2014-07-26 ENCOUNTER — Encounter: Payer: Self-pay | Admitting: Adult Health

## 2014-07-26 ENCOUNTER — Ambulatory Visit (HOSPITAL_BASED_OUTPATIENT_CLINIC_OR_DEPARTMENT_OTHER): Payer: Medicare Other | Admitting: Adult Health

## 2014-07-26 VITALS — BP 120/71 | HR 75 | Temp 98.0°F | Resp 14

## 2014-07-26 DIAGNOSIS — C50412 Malignant neoplasm of upper-outer quadrant of left female breast: Secondary | ICD-10-CM

## 2014-07-26 DIAGNOSIS — G622 Polyneuropathy due to other toxic agents: Secondary | ICD-10-CM

## 2014-07-26 DIAGNOSIS — T451X5A Adverse effect of antineoplastic and immunosuppressive drugs, initial encounter: Secondary | ICD-10-CM

## 2014-07-26 DIAGNOSIS — G620 Drug-induced polyneuropathy: Secondary | ICD-10-CM | POA: Insufficient documentation

## 2014-07-26 MED ORDER — GABAPENTIN 300 MG PO CAPS: 300.0000 mg | ORAL_CAPSULE | Freq: Every day | ORAL | Status: DC

## 2014-07-26 NOTE — Progress Notes (Signed)
   Department of Radiation Oncology  Phone:  [phone] Fax:        [phone]   Name: [name] MRN: [id number]  DOB: 08-May-1951  Date: 07/26/2014  Follow Up Visit Note  Diagnosis: Breast cancer of upper-outer quadrant of left female breast   Staging form: Breast, AJCC 7th Edition     Clinical: Stage IIA (T2, N0, cM0) - Unsigned       Staging comments: Staged at breast conference 11/01/13.      Pathologic stage from 03/21/2014: yT0, N0 - Unsigned  Summary and Interval since last radiation: 61 Gy completed 06/19/14  Interval History: April Walker presents today for routine followup.  Doing well. No complaints. Met with survivorship NP. Taking tamoxifen with a few hot flashes. Skin has healed well. Still using radiaplex.   Physical Exam:  Dry skin over elft breast. Minimal hyperpigmentation in the axilla.   IMPRESSION: April Walker is a 64 y.o. female s/p radiation with resolving acute effects of treatment.   PLAN:  She is doing well. We discussed the need for follow up every 4-6 months which she has scheduled.  We discussed the need for yearly mammograms which she can schedule with her OBGYN or with medical oncology. We discussed the need for sun protection in the treated area.  She can always call me with questions.  I will follow up with her on an as needed basis.    [name], MD

## 2014-07-26 NOTE — Progress Notes (Signed)
CLINIC:  Cancer Survivorship   REASON FOR VISIT:  Routine follow-up post-treatment for a recent history of breast cancer.  HISTORY OF PRESENT ILLNESS:  April Walker is a very pleasant 64 y.o. 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  Her oncologic history dates back to 09/2013 when she underwent a screening mammogram revealing a 3 cm mass in the upper outer quadrant of her left breast. Biopsy of this left breast mass revealed grade 3 invasive ductal carcinoma, which was ER positive, PR positive, and HER-2 positive (ratio 2.72), with a Ki67 of 87%.  She completed 6 cycles of neoadjuvant chemotherapy with Carboplatin, Docetaxel, Herceptin, and Perjeta.   She underwent left lumpectomy on 03/21/2014 with surgical pathology revealing complete response to treatment and no evidence of carcinoma.  A total of 4 left axillary lymph nodes were removed at the time of surgery and all were negative for malignancy.  Surgical margins were clear.  Lastly, she completed radiation therapy to the left breast with a left breast boost from 05/02/2014-06/19/2014.  Per the recommendations of Dr. Jana Hakim, she began anti-estrogen therapy with anastrazole on 07/06/2014.  Her planned duration of anti-estrogen therapy is 5 years.  April Walker presents to the [facility] today for our initial meeting to review her survivorship care plan detailing her treatment course for breast cancer, as well as monitoring long-term side effects of that treatment, education regarding health maintenance, screening, and overall wellness and health promotion.     Overall, April Walker reports feeling quite well since completing her chemotherapy, breast surgery, and radiation therapy.  However, she does report experiencing numbness and tingling in her bilateral feet, which is worse at night.  She reports having about 1 hot flash/week since starting the Arimidex about 3 weeks ago.  She reports that she wakes up with her  nightgown damp from the hot flashes.  She endorses fatigue, which requires her to take "cat naps" daily, which offer her relief of the fatigue. She has remained physically active as well.  April Walker reports having an excellent support system in her friends and her church family. She even attends a breast cancer support group through her church and has also registered to participate in our [facility] class series here at the [facility].   REVIEW OF SYSTEMS:  General: Denies fever, chills, unintentional weight loss, or generalized fatigue.  Cardiac: Denies palpitations, chest pain. Endorses occasional peripheral edema for which she takes prn Lasix and potassium supplementation.  Respiratory: Denies cough, shortness of breath, and dyspnea on exertion.  GI: Denies abdominal pain, constipation, diarrhea, nausea, or vomiting.  GU: Denies dysuria, hematuria, vaginal bleeding, vaginal discharge, or vaginal dryness.  Musculoskeletal: Endorses mild joint pain. She reports that this is not worse in severity or frequency than her minor joint aches/pains .  Neuro: Denies headache or recent falls. Endorses peripheral neuropathy per HPI. Skin: Denies rash, pruritis, or open wounds.  Breast: Denies any new nodularity, masses, tenderness, nipple changes, or nipple discharge.  Psych: Denies depression, anxiety.  Endorses some word finding and short-term memory difficulty, but this is stable and grossly unchanged from before her cancer diagnosis.   A 14-point review of systems was completed and was negative, except as noted above.  BRIEF ONCOLOGIC HISTORY:    Breast cancer of upper-outer quadrant of left female breast   09/14/2013 Mammogram Screening mammogram detected 3 cm mass in upper-outer left breast, 6 cm from nipple.    10/11/2013  Clinical Stage [id number], Stage IIA   10/11/2013 Initial Diagnosis Left breat needle core biopsy, labeled 2:30 o'clock showing grade 3 invasive ductal carcinoma.  ER+ (100%), PR+ (43%), HER2 amplified with ratio of 2.72. Ki76 is 87%.    10/23/2013 Breast MRI 3 cm biopsy-proven neoplasm within the upper outer left breast. Asymmetric cortical thickening of up upper left axillary lymph node (1.4cm size) suspicious for lymphatic tumor spread. Second-look left axillary ultrasound and possible biopsy is recommended.   10/30/2013 Procedure Left breast needle core biopsy of mass labeled 7:30 o'clock. Only showed fibrocystic changes. No malignancy identified. Left axillary lymph node biopsied and was also benign.   11/14/2013 Echocardiogram Pre-treatment EF: 55-60%   11/20/2013 -  Chemotherapy 1 year of Herceptin (to be completed in 10/2014)   11/20/2013 - 03/05/2014 Chemotherapy 6 cycles of Carboplatin, Taxotere, Herceptin, and Perjeta   03/21/2014 Definitive Surgery Left breast seed-localized lumpectomy with sentinel lymph node biopsy.    03/21/2014 Pathology Results Surgical pathology: fibrocystic changes with calcifications, changes consistent with chemotherapy, no evidence of malignancy.  0/4 axillary sentinel lymph nodes. No evidence of carcinoma.    05/02/2014 - 06/19/2014 Radiation Therapy Left breast: 25 fractions, total of 45 Gy.  Left breast boost: 8 fractions, total of 16 Gy.    05/08/2014 Echocardiogram EF 55%   07/06/2014 -  Anti-estrogen oral therapy Started Anastrazole. Duration of treatment is 5 years. (will complete in 07/2019)    ONCOLOGY TREATMENT TEAM:  1. Surgeon:  Dr. Barry Dienes at [facility] 2. Medical Oncologist: Dr. Jana Hakim, [name], NP  3. Radiation Oncologist: Dr. Pablo Ledger    PAST MEDICAL/SURGICAL HISTORY:  Past Medical History  Diagnosis Date  . Abnormal cholesterol test   . Hyperlipidemia   . Knee pain, left   . GERD (gastroesophageal reflux disease)   . Arthritis   . Tubulovillous adenoma of colon   . Diverticulosis   . Internal hemorrhoids   . Wears glasses   . Hearing deficit     no hearing aids  . Radiation  05/02/14-06/19/14    Left Breast   Past Surgical History  Procedure Laterality Date  . Tonsillectomy and adenoidectomy    . Right knee arthroscopy  2011 Dr. Aline Brochure  . Treatment fistula anal    . Upper gastrointestinal endoscopy    . Hemorrhoid surgery    . Colonoscopy  2002    hemorrhoids  . Colonoscopy w/ polypectomy  12/09/2010    1 cm sigmoid polyp - , diverticulosis, hemorrhoids  . Knee surgery    . Knee surgery  2012    Left knee arthroscopy  . Total knee arthroplasty  11/23/2011    Procedure: TOTAL KNEE ARTHROPLASTY;  Surgeon: Carole Civil, MD;  Location: [facility];  Service: Orthopedics;  Laterality: Left;  . Portacath placement Left 11/17/2013    Procedure: INSERTION PORT-A-CATH;  Surgeon: Stark Klein, MD;  Location: [facility];  Service: General;  Laterality: Left;  . Partial mastectomy with needle localization and axillary sentinel lymph node bx Left 03/21/14    Dr.Faera [facility]     HEALTH MAINTENANCE/WELLNESS HISTORY:  1. Last colonoscopy: 12/09/2010. Per patient, there was "pre-cancer" found and they recommended closer follow-up than the recommended 10 years.  She will discuss with her PCP.  2. Flu vaccine given: Yes, 04/30/2014 3. Pneumovax vaccine given: Yes, in 2015 per patient  4. Shingles vaccine given: Yes, in 2000 per patient 5. Last DEXA scan: 07/03/2014, results were normal.    CURRENT MEDICATIONS:  Outpatient  Encounter Prescriptions as of 07/26/2014  Medication Sig Note  . Acetaminophen (TYLENOL EXTRA STRENGTH PO) Take by mouth.   Marland Kitchen acyclovir (ZOVIRAX) 400 MG tablet Take 1 tablet (400 mg total) by mouth 2 (two) times daily.   Marland Kitchen anastrozole (ARIMIDEX) 1 MG tablet Take 1 tablet (1 mg total) by mouth daily. 06/19/2014: Will start January 1,2016  . furosemide (LASIX) 20 MG tablet Take 1 tablet (20 mg total) by mouth daily as needed.   . hydrocortisone (ANUSOL-HC) 25 MG suppository Place 1 suppository (25 mg total) rectally at bedtime as needed  for hemorrhoids or itching.   . loratadine (CLARITIN) 10 MG tablet Take 10 mg by mouth daily as needed. For allergies   . omeprazole (PRILOSEC) 20 MG capsule Take 20 mg by mouth as needed.   Marland Kitchen oxyCODONE-acetaminophen (ROXICET) 5-325 MG per tablet Take 1-2 tablets by mouth every 4 (four) hours as needed for severe pain.   . potassium chloride (K-DUR) 10 MEQ tablet Take 2 tablets (20 mEq total) by mouth daily as needed.   . simvastatin (ZOCOR) 10 MG tablet Take 10 mg by mouth daily. Pt TAKES AT BEDTIME      ONCOLOGIC FAMILY HISTORY:  1. Patient is adopted, so completing a full oncologic family history is not possible.     SOCIAL HISTORY:  MARITES NATH is single and lives alone with her 66 year old black Clinical research associate named [name] in [place], [place].   Ms. 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   She denies any current or history of tobacco or illicit drug use.  She drinks an occasional alcoholic beverage 1-2 times per month.      PHYSICAL EXAMINATION:  Vital Signs:   Filed Vitals:   07/26/14 1431  BP: 120/71  Pulse: 75  Temp: 98 F (36.7 C)  Resp: 14   General: Well-nourished, well-appearing female in no acute distress.  She is unaccompanied today.   HEENT: Head is atraumatic and normocephalic.  Pupils equal and reactive to light and accomodation. Conjunctivae clear without exudate.  Sclerae anicteric. Oral mucosa is pink, moist, and intact without lesions.  Oropharynx is pink without lesions or erythema.  Lymph: No cervical, supraclavicular, infraclavicular, or axillary lymphadenopathy noted on palpation.  Cardiovascular: Regular rate and rhythm without murmurs, rubs, or gallops. Respiratory: Clear to auscultation bilaterally. Chest expansion symmetric without accessory muscle use on inspiration or expiration.  GI: Abdomen soft and round. No tenderness to palpation. Bowel sounds normoactive in 4 quadrants.  No hepatosplenomegaly.   GU: Deferred.  Musculoskeletal: Muscle strength 5/5 in all extremities. Full ROM noted in all extremities.  Neuro: No focal deficits. Steady gait.  Psych: Mood and affect normal and appropriate for situation.  Extremities: No edema, cyanosis, or clubbing.  Skin: Warm and dry. No open lesions noted.   LABORATORY DATA:  None for this visit.   DIAGNOSTIC IMAGING:  None for this visit. Of note, most recent ECHO (05/08/2014): EF 55%    ASSESSMENT AND PLAN:   1. History of breast cancer:  Ms. Ouellet will follow-up with her medical oncologist, Dr. Jana Hakim or [name], NP in breast medical oncology in February 2016 for history and physical exam per surveillance protocol.  She will continue to receive the Herceptin infusions until 10/2014, which will complete her 1 year of therapy as recommended by Dr. Jana Hakim.  She will continue her endocrine therapy with Anastrazole as prescribed by Dr. Jana Hakim.  She was instructed to  make Dr. Jana Hakim, Nira Conn, or myself aware if she begins to experience any bothersome side effects of the anti-estrogen medication and I could see her back in clinic to help manage those side effects, as needed.    Ms. Penado was encouraged to contact Dr. Jana Hakim, Nira Conn, myself, or her Gynecologist with any vaginal bleeding while taking Arimidex. Other side effects of Arimidex were again reviewed with her as well. A comprehensive survivorship care plan and treatment summary was reviewed with the patient today detailing her breast cancer diagnosis, treatment course, potential late/long-term effects of treatment, appropriate follow-up care with recommendations for the future, and patient education resources.  A copy of this summary, along with a letter will be sent to the patient's primary care provider, will be mailed/routed after today's visit.  Ms. Chipman is welcome to return to the Survivorship Clinic in the future, as needed; no follow-up will be  scheduled at this time.    2. Chemotherapy-induced peripheral neuropathy:  This is likely due to her recent chemotherapy regimen including taxanes (like Docetaxel) and platinum-based chemotherapy (like Carboplatin).  Since she describes the symptoms as being most bothersome at night and she appears to be having some sleep disturbance as a result, I will prescribe for her to begin taking Gabapentin 300 mg once daily at bedtime.  This dose is conservative and can be increased for adequate symptom management.  Ms. Dehner agreed to try the Gabapentin with the understanding that we may need to increase the dose for maximum effect.  She agreed with this plan.    3. Fatigue: This is likely multifactorial.  It is not uncommon for survivors of cancer to continue to experience fatigue at the completion of their anti-cancer treatments.  She was offered reassurance and encouraged to continue to engage in physical activity daily.  The fatigue could also be related to sleep disturbance with the peripheral neuropathy and occasional hot flashes that are occuring at night.  My hope is that the Gabapentin will be of benefit and allow her to gain some restful sleep as well as treat the neuropathic pain.   4.  Hot flashes: This is likely due to the anastrazole.  She does not want to try any medication intervention for the hot flashes at this time because she reports that they are not terribly severe and are happening about once per week.  She was given some recommendations to help identify common triggers of hot flashes to potentially offer her some relief, as well as ways she can modify her environment/behavior to help decrease the severity and frequency of hot flashes related to anti-estrogen therapy.   5. Bone health:  Given Ms. Diffee's age, history of breast cancer, and her current treatment regimen including anti-estrogen therapy with Anastrazole, she is at risk for bone demineralization.  Per our records, her last DEXA  scan was 07/03/2014 which was normal.  She would be due for her next DEXA scan evaluation in the next 1-2 years, per guidelines.   In addition, she was encouraged to increase her consumption of foods rich in calcium, as well as increase her weight-bearing activities.  She was given education on specific activities to promote bone health.  6. Cancer screening:  Due to Ms. Howdyshell's history and her age, she should receive screening for skin cancers, colon cancer, and gynecologic cancers.  The information and recommendations are listed on the patient's comprehensive care plan/treatment summary and were reviewed in detail with the patient.    5. Health maintenance and  wellness promotion: Ms. Wixon was encouraged to consume 5-7 servings of fruits and vegetables per day. She was also encouraged to engage in moderate to vigorous exercise for 30 minutes per day most days of the week. She was instructed to limit her alcohol consumption and continue to abstain from tobacco use.  Her vaccinations are currently up-to-date.     7. Support services/counseling: It is not uncommon for this period of the patient's cancer care trajectory to be one of many emotions and stressors.  Ms. Frew was encouraged to take advantage of our many support services programs, support groups, and/or counseling in coping with her new life as a cancer survivor after completing anti-cancer treatment.  She was offered support today through active listening and expressive supportive counseling.  She was given information regarding our available services and encouraged to contact me with any questions or for help enrolling in any of our support group/programs.    A total of 60 minutes of face-to-face time was spent with this patient with greater than 50% of that time in counseling and care-coordination.   Mike Craze, NP Survivorship Program [facility] [phone]   Note: PRIMARY CARE PROVIDER Criselda Peaches,  [place] [phone]

## 2014-07-30 ENCOUNTER — Other Ambulatory Visit: Payer: Medicare Other

## 2014-07-30 ENCOUNTER — Telehealth: Payer: Self-pay | Admitting: Adult Health

## 2014-07-30 NOTE — Telephone Encounter (Signed)
April Walker returned my call and let me know that she had not had a chance to start taking the Gabapentin that was prescribed at her Survivorship Clinic visit last week because she has been "snowed in" since last week and unable to get to the pharmacy.  I encouraged her to call me if she has any questions or concerns whenever she does begin the medication and I told her I would try to call back to check on her some time next week.    Mike Craze, NP [place] [phone]

## 2014-07-30 NOTE — Telephone Encounter (Signed)
I called April Walker to check on her to see how she was feeling and if she had picked up/started the Gabapentin prescribed for her last week during her Survivorship Clinic visit for chemo-induced peripheral neuropathy.  I left a voicemail and asked her to return my call.   Mike Craze, NP [place] [phone]

## 2014-08-09 ENCOUNTER — Telehealth: Payer: Self-pay | Admitting: Adult Health

## 2014-08-09 NOTE — Telephone Encounter (Signed)
April Walker left a voicemail to update me and let me know that she was feeling better after starting the Gabapentin medication.  She states that she is only taking it about every other night, but that her symptoms of neuropathy are improving.    [name], NP was previously sent a message regarding this patient as she will be following up with her at a clinic visit soon.    Please let me know if I can be of assistance for this patient at any time in the future.   Mike Craze, NP [place] [phone]

## 2014-08-10 ENCOUNTER — Other Ambulatory Visit: Payer: Self-pay | Admitting: *Deleted

## 2014-08-10 DIAGNOSIS — C50412 Malignant neoplasm of upper-outer quadrant of left female breast: Secondary | ICD-10-CM

## 2014-08-12 ENCOUNTER — Other Ambulatory Visit: Payer: Self-pay | Admitting: Oncology

## 2014-08-13 ENCOUNTER — Encounter: Payer: Self-pay | Admitting: Nurse Practitioner

## 2014-08-13 ENCOUNTER — Ambulatory Visit (HOSPITAL_BASED_OUTPATIENT_CLINIC_OR_DEPARTMENT_OTHER): Payer: Medicare Other

## 2014-08-13 ENCOUNTER — Telehealth: Payer: Self-pay | Admitting: *Deleted

## 2014-08-13 ENCOUNTER — Other Ambulatory Visit: Payer: Medicare Other

## 2014-08-13 ENCOUNTER — Telehealth: Payer: Self-pay | Admitting: Hematology

## 2014-08-13 ENCOUNTER — Ambulatory Visit (HOSPITAL_BASED_OUTPATIENT_CLINIC_OR_DEPARTMENT_OTHER): Payer: Medicare Other | Admitting: Nurse Practitioner

## 2014-08-13 ENCOUNTER — Other Ambulatory Visit (HOSPITAL_BASED_OUTPATIENT_CLINIC_OR_DEPARTMENT_OTHER): Payer: Medicare Other

## 2014-08-13 VITALS — BP 129/73 | HR 78 | Temp 98.1°F | Resp 18 | Wt 211.4 lb

## 2014-08-13 DIAGNOSIS — Z170 Estrogen receptor positive status [ER+]: Secondary | ICD-10-CM | POA: Diagnosis not present

## 2014-08-13 DIAGNOSIS — Z5112 Encounter for antineoplastic immunotherapy: Secondary | ICD-10-CM | POA: Diagnosis not present

## 2014-08-13 DIAGNOSIS — C50412 Malignant neoplasm of upper-outer quadrant of left female breast: Secondary | ICD-10-CM

## 2014-08-13 LAB — CBC WITH DIFFERENTIAL/PLATELET
BASO%: 0.2 % (ref 0.0–2.0)
BASOS ABS: 0 10*3/uL (ref 0.0–0.1)
EOS%: 3 % (ref 0.0–7.0)
Eosinophils Absolute: 0.2 10*3/uL (ref 0.0–0.5)
HCT: 37.2 % (ref 34.8–46.6)
HGB: 11.8 g/dL (ref 11.6–15.9)
LYMPH%: 24 % (ref 14.0–49.7)
MCH: 26.3 pg (ref 25.1–34.0)
MCHC: 31.8 g/dL (ref 31.5–36.0)
MCV: 82.9 fL (ref 79.5–101.0)
MONO#: 0.4 10*3/uL (ref 0.1–0.9)
MONO%: 6.9 % (ref 0.0–14.0)
NEUT%: 65.9 % (ref 38.4–76.8)
NEUTROS ABS: 4.1 10*3/uL (ref 1.5–6.5)
Platelets: 225 10*3/uL (ref 145–400)
RBC: 4.49 10*6/uL (ref 3.70–5.45)
RDW: 14.8 % — AB (ref 11.2–14.5)
WBC: 6.2 10*3/uL (ref 3.9–10.3)
lymph#: 1.5 10*3/uL (ref 0.9–3.3)

## 2014-08-13 LAB — COMPREHENSIVE METABOLIC PANEL (CC13)
ALBUMIN: 3.6 g/dL (ref 3.5–5.0)
ALT: 23 U/L (ref 0–55)
ANION GAP: 9 meq/L (ref 3–11)
AST: 23 U/L (ref 5–34)
Alkaline Phosphatase: 108 U/L (ref 40–150)
BILIRUBIN TOTAL: 0.43 mg/dL (ref 0.20–1.20)
BUN: 11.2 mg/dL (ref 7.0–26.0)
CALCIUM: 9.6 mg/dL (ref 8.4–10.4)
CHLORIDE: 110 meq/L — AB (ref 98–109)
CO2: 27 mEq/L (ref 22–29)
Creatinine: 0.8 mg/dL (ref 0.6–1.1)
EGFR: 82 mL/min/{1.73_m2} — AB (ref >90)
Glucose: 92 mg/dl (ref 70–140)
POTASSIUM: 3.8 meq/L (ref 3.5–5.1)
Sodium: 146 mEq/L — ABNORMAL HIGH (ref 136–145)
TOTAL PROTEIN: 6.6 g/dL (ref 6.4–8.3)

## 2014-08-13 MED ORDER — TRASTUZUMAB CHEMO INJECTION 440 MG
6.0000 mg/kg | Freq: Once | INTRAVENOUS | Status: AC
  Administered 2014-08-13: 609 mg via INTRAVENOUS
  Filled 2014-08-13: qty 29

## 2014-08-13 MED ORDER — ACETAMINOPHEN 325 MG PO TABS
650.0000 mg | ORAL_TABLET | Freq: Once | ORAL | Status: AC
  Administered 2014-08-13: 650 mg via ORAL

## 2014-08-13 MED ORDER — HEPARIN SOD (PORK) LOCK FLUSH 100 UNIT/ML IV SOLN
500.0000 [IU] | Freq: Once | INTRAVENOUS | Status: AC | PRN
  Administered 2014-08-13: 500 [IU]
  Filled 2014-08-13: qty 5

## 2014-08-13 MED ORDER — SODIUM CHLORIDE 0.9 % IJ SOLN
10.0000 mL | INTRAMUSCULAR | Status: DC | PRN
  Administered 2014-08-13: 10 mL
  Filled 2014-08-13: qty 10

## 2014-08-13 MED ORDER — ACETAMINOPHEN 325 MG PO TABS
ORAL_TABLET | ORAL | Status: AC
  Filled 2014-08-13: qty 2

## 2014-08-13 MED ORDER — DIPHENHYDRAMINE HCL 25 MG PO CAPS
ORAL_CAPSULE | ORAL | Status: AC
  Filled 2014-08-13: qty 2

## 2014-08-13 MED ORDER — SODIUM CHLORIDE 0.9 % IV SOLN
Freq: Once | INTRAVENOUS | Status: AC
  Administered 2014-08-13: 10:00:00 via INTRAVENOUS

## 2014-08-13 MED ORDER — DIPHENHYDRAMINE HCL 25 MG PO CAPS
25.0000 mg | ORAL_CAPSULE | Freq: Once | ORAL | Status: AC
  Administered 2014-08-13: 25 mg via ORAL

## 2014-08-13 NOTE — Telephone Encounter (Signed)
Per staff message and POF I have scheduled appts. Advised scheduler of appts. JMW  

## 2014-08-13 NOTE — Addendum Note (Signed)
Addended by: Marcelino Duster on: 08/13/2014 02:43 PM   Modules accepted: Orders

## 2014-08-13 NOTE — Patient Instructions (Signed)
[facility] Discharge Instructions for Patients Receiving Chemotherapy  Today you received the following chemotherapy agents Herceptin  To help prevent nausea and vomiting after your treatment, we encourage you to take your nausea medication     If you develop nausea and vomiting that is not controlled by your nausea medication, call the clinic.   BELOW ARE SYMPTOMS THAT SHOULD BE REPORTED IMMEDIATELY:  *FEVER GREATER THAN 100.5 F  *CHILLS WITH OR WITHOUT FEVER  NAUSEA AND VOMITING THAT IS NOT CONTROLLED WITH YOUR NAUSEA MEDICATION  *UNUSUAL SHORTNESS OF BREATH  *UNUSUAL BRUISING OR BLEEDING  TENDERNESS IN MOUTH AND THROAT WITH OR WITHOUT PRESENCE OF ULCERS  *URINARY PROBLEMS  *BOWEL PROBLEMS  UNUSUAL RASH Items with * indicate a potential emergency and should be followed up as soon as possible.  Feel free to call the clinic you have any questions or concerns. The clinic phone number is [phone].    

## 2014-08-13 NOTE — Progress Notes (Signed)
[place]  Telephone:(336) [phone] Fax:(336) [phone]     ID: April Walker OB: 1950-08-25  MR#: [id number]  XNA#:[id number]  PCP: Criselda Peaches, MD GYN:  Vania Rea SU: Stark Klein  OTHER MD: [name], Silvano Rusk, Arther Abbott  CHIEF COMPLAINT: Left Breast Cancer, locally advanced TREATMENT: adjuvant trastuzumab, anastrozole   BREAST CANCER HISTORY: From the original integument:  April Walker had routine screening mammography at [facility] 09/14/2013. This showed a possible mass in the left breast. On 10/11/2013 she underwent left diagnostic mammography and ultrasonography which confirmed an irregular mass in the upper outer left breast measuring up to 3 cm. This was firm and fixed at the 2:30 o'clock position, 6 cm from the nipple. Ultrasound showed an irregular hypoechoic mass measuring 2.8 cm. The left axilla showed no evidence of adenopathy.  On 10/11/2013 the patient underwent left breast biopsy, with the pathology [facility] [id number]) showing an invasive ductal carcinoma, high-grade, estrogen receptor 100% positive, progesterone receptor 43% positive, both with strong staining intensity, with an MIB-1 of 87% and HER-2 amplified, the signals ratio being 2.72, the number per cell 3.40.  On 10/23/2013 the patient underwent bilateral breast MRI. This showed the breast and position to be density be. In the left breast there was a 3 cm irregular enhancing mass in the upper outer quadrant. There was a 9 mm oval mass in the inner lower left breast with no definite fatty hilum. There was a 1.4 cm upper left axillary lymph node with a thickened cortex. There were no other abnormal appearing lymph nodes. Ultrasound of the left breast 10/30/2013 found of the left axillary lymph node with the mildly asymmetrical cortex, and a 7 mm her mass in the 7:30 o'clock position of the left breast. Biopsy of both these suspicious areas was performed 10/30/2013, and the preliminary  report is that they're both benign.  The patient's subsequent history is as detailed below  INTERVAL HISTORY: April Walker returns today for follow up of her locally advanced left breast carcinoma. She is due for trastuzumab today. She also started anastrozole at the beginning of last month. She is tolerating the drug relatively well. Her hot flashes are actually a little better than they have been and she has not noticed any joint aches or vaginal changes. 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 This is helpful but she takes it every other night because it makes her drowsy. The interval history is otherwise unremarkable.   REVIEW OF SYSTEMS: April Walker denies fevers, chills, nausea, vomiting, or changes in bowel or bladder habits. She is eating and drinking well. She denies shortness of breath, chest pain ,cough, palpitations, or fatigue. She denies unexplained weight loss, headaches, dizziness, bruising, or bleeding. A detailed review of systems was conducted and is otherwise negative except for what is noted above.    PAST MEDICAL HISTORY: Past Medical History  Diagnosis Date  . Abnormal cholesterol test   . Hyperlipidemia   . Knee pain, left   . GERD (gastroesophageal reflux disease)   . Arthritis   . Tubulovillous adenoma of colon   . Diverticulosis   . Internal hemorrhoids   . Wears glasses   . Hearing deficit     no hearing aids  . Radiation 05/02/14-06/19/14    Left Breast    PAST SURGICAL HISTORY: Past Surgical History  Procedure Laterality Date  . Tonsillectomy and adenoidectomy    . Right knee arthroscopy  2011 Dr. Aline Brochure  .  Treatment fistula anal    . Upper gastrointestinal endoscopy    . Hemorrhoid surgery    . Colonoscopy  2002    hemorrhoids  . Colonoscopy w/ polypectomy  12/09/2010    1 cm sigmoid polyp - , diverticulosis, hemorrhoids  . Knee surgery    . Knee surgery  2012    Left knee arthroscopy  . Total knee arthroplasty   11/23/2011    Procedure: TOTAL KNEE ARTHROPLASTY;  Surgeon: Carole Civil, MD;  Location: [facility];  Service: Orthopedics;  Laterality: Left;  . Portacath placement Left 11/17/2013    Procedure: INSERTION PORT-A-CATH;  Surgeon: Stark Klein, MD;  Location: [place];  Service: General;  Laterality: Left;  . Partial mastectomy with needle localization and axillary sentinel lymph node bx Left 03/21/14    Dr.Faera Byerly    FAMILY HISTORY Family History  Problem Relation Age of Onset  . Adopted: Yes  . Stroke Mother   . Heart attack Father    the patient is adopted and has no information on her biological family. the patient's adoptive father died at the age of 22 from a myocardial infarction; productive mother died from a stroke at age 34. The patient does not know of any siblings  GYNECOLOGIC HISTORY: (Reviewed 12/18/2013) Menarche age 35. The patient is GX P0. She underwent menopause in her early 58s and took hormone replacement until 2010.  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 She is single, and lives at home with her black lab, April Walker, and her cats April Walker and April Walker    ADVANCED DIRECTIVES: Not in place   HEALTH MAINTENANCE:  (Reviewed 12/18/2013) History  Substance Use Topics  . Smoking status: Never Smoker   . Smokeless tobacco: Never Used  . Alcohol Use: Yes     Comment: socially     Colonoscopy: 2012/[name]  PAP: 2014/[name]  Bone density: not on file  Lipid panel: not on file   Allergies  Allergen Reactions  . Sulfonamide Derivatives Anaphylaxis and Hives    Pt. Stated, "large welps on neck immediately."  . Triprolidine-Pseudoephedrine Other (See Comments)    This is Actifed. Increased heart rate to tachycardia.    Current Outpatient Prescriptions  Medication Sig Dispense Refill  . Acetaminophen (TYLENOL EXTRA STRENGTH PO) Take by mouth.    Marland Kitchen  anastrozole (ARIMIDEX) 1 MG tablet Take 1 tablet (1 mg total) by mouth daily. 30 tablet 2  . gabapentin (NEURONTIN) 300 MG capsule Take 1 capsule (300 mg total) by mouth at bedtime. (Patient taking differently: Take 300 mg by mouth at bedtime. Pt taking this medication every other night.) 30 capsule 3  . simvastatin (ZOCOR) 10 MG tablet Take 10 mg by mouth daily. Pt TAKES AT BEDTIME    . acyclovir (ZOVIRAX) 400 MG tablet Take 1 tablet (400 mg total) by mouth 2 (two) times daily. (Patient not taking: Reported on 08/13/2014) 60 tablet 2  . furosemide (LASIX) 20 MG tablet Take 1 tablet (20 mg total) by mouth daily as needed. (Patient not taking: Reported on 08/13/2014) 30 tablet 3  . hydrocortisone (ANUSOL-HC) 25 MG suppository Place 1 suppository (25 mg total) rectally at bedtime as needed for hemorrhoids or itching. (Patient not taking: Reported on 08/13/2014) 10 suppository 0  . loratadine (CLARITIN) 10 MG tablet Take 10 mg by mouth daily as needed. For allergies    . omeprazole (PRILOSEC) 20 MG capsule  Take 20 mg by mouth as needed.    Marland Kitchen oxyCODONE-acetaminophen (ROXICET) 5-325 MG per tablet Take 1-2 tablets by mouth every 4 (four) hours as needed for severe pain. (Patient not taking: Reported on 08/13/2014) 30 tablet 0  . potassium chloride (K-DUR) 10 MEQ tablet Take 2 tablets (20 mEq total) by mouth daily as needed. (Patient not taking: Reported on 08/13/2014) 60 tablet 3   No current facility-administered medications for this visit.    OBJECTIVE: Middle-aged white woman in no acute distress Filed Vitals:   08/13/14 0919  BP: 129/73  Pulse: 78  Temp: 98.1 F (36.7 C)  Resp: 18     Body mass index is 32.15 kg/(m^2).    ECOG FS:1 - Symptomatic but completely ambulatory Filed Weights   08/13/14 0919  Weight: 211 lb 6.4 oz (95.89 kg)   Skin: warm, dry  HEENT: sclerae anicteric, conjunctivae pink, oropharynx clear. No thrush or mucositis.  Lymph Nodes: No cervical or supraclavicular lymphadenopathy   Lungs: clear to auscultation bilaterally, no rales, wheezes, or rhonci  Heart: regular rate and rhythm  Abdomen: round, soft, non tender, positive bowel sounds  Musculoskeletal: No focal spinal tenderness, no peripheral edema  Neuro: non focal, well oriented, positive affect  Breasts: deferred  LAB RESULTS:   Lab Results  Component Value Date   WBC 6.2 08/13/2014   NEUTROABS 4.1 08/13/2014   HGB 11.8 08/13/2014   HCT 37.2 08/13/2014   MCV 82.9 08/13/2014   PLT 225 08/13/2014      Chemistry      Component Value Date/Time   NA 146* 08/13/2014 0906   NA 143 03/21/2014 0801   K 3.8 08/13/2014 0906   K 3.2* 03/21/2014 0801   CL 108 03/21/2014 0801   CO2 27 08/13/2014 0906   CO2 25 02/12/2014 1003   BUN 11.2 08/13/2014 0906   BUN 8 03/21/2014 0801   CREATININE 0.8 08/13/2014 0906   CREATININE 0.60 03/21/2014 0801      Component Value Date/Time   CALCIUM 9.6 08/13/2014 0906   CALCIUM 10.0 02/12/2014 1003   ALKPHOS 108 08/13/2014 0906   ALKPHOS 92 02/12/2014 1003   AST 23 08/13/2014 0906   AST 16 02/12/2014 1003   ALT 23 08/13/2014 0906   ALT 16 02/12/2014 1003   BILITOT 0.43 08/13/2014 0906   BILITOT 0.4 02/12/2014 1003       STUDIES: Most recent echocardiogram on 05/08/14 showed an ejection fraction of 55%  Most recent bone density scan on 07/03/14 showed a t-score of -1.0 (normal)  ASSESSMENT: 64 y.o. [place] woman status post left breast biopsy 10/11/2013 for a clinical T2 N0, stage IIA invasive ductal carcinoma, grade 3, estrogen and progesterone receptor positive, with an MIB-1 of 87%, and HER-2 amplified  (a) additional Left breast and left axillary node biopsy 10/30/2013 benign  (1) started neoadjuvant therapy 11/20/2013 , consisting of carboplatin, docetaxel, trastuzumab and pertuzumab every 3 weeks x6 completed 03/05/2014   (2) trastuzumab alone to continue to complete a year; most recent echocardiogram 01/24/2014 shows a well preserved ejection  fraction.  (3) Patient underwent left lumpectomy with sentinel node biopsy on 03/21/14. Pathology results demonstrated a complete response, pT0 pN0  (4) radiation to follow surgery   (5) anastrozole started 07/06/14   PLAN: April Walker looks and feels well today. The labs were reviewed in detail and were stable. She will proceed with trastuzumab as planned today. She is due for an echocardiogram and I have placed orders for this to be  performed as soon as possible this week.   She is tolerating the anastrozole well and the plan is to continue this drug for 5 years of antiestrogen therapy. We reviewed the results of her bone density scan and the results were normal. We will repeat this scan in 2 years.  Robert is due for a mammogram in April. She will return in 3 months for labs and a follow up visit. At this time (May 2016) she should be wrapping up with her trastuzumab treatments. She understands and agrees with this plan. She knows the goal of treatment in her case is cure. She has been encouraged to call with any issues that might arise before her next visit here.    Marcelino Duster, NP  08/13/2014 9:44 AM

## 2014-08-13 NOTE — Telephone Encounter (Signed)
, °

## 2014-08-14 ENCOUNTER — Telehealth: Payer: Self-pay | Admitting: Nurse Practitioner

## 2014-08-14 NOTE — Telephone Encounter (Signed)
, °

## 2014-08-16 ENCOUNTER — Ambulatory Visit (HOSPITAL_COMMUNITY)
Admission: RE | Admit: 2014-08-16 | Discharge: 2014-08-16 | Disposition: A | Discharge status: Home / Self Care | Payer: Medicare Other | Source: Ambulatory Visit | Attending: Nurse Practitioner | Admitting: Nurse Practitioner

## 2014-08-16 ENCOUNTER — Other Ambulatory Visit: Payer: Self-pay | Admitting: Oncology

## 2014-08-16 DIAGNOSIS — Z5111 Encounter for antineoplastic chemotherapy: Secondary | ICD-10-CM | POA: Diagnosis not present

## 2014-08-16 DIAGNOSIS — I071 Rheumatic tricuspid insufficiency: Secondary | ICD-10-CM | POA: Diagnosis not present

## 2014-08-16 DIAGNOSIS — C50412 Malignant neoplasm of upper-outer quadrant of left female breast: Secondary | ICD-10-CM | POA: Diagnosis not present

## 2014-08-16 DIAGNOSIS — Z853 Personal history of malignant neoplasm of breast: Secondary | ICD-10-CM

## 2014-08-16 NOTE — Progress Notes (Signed)
  Echocardiogram 2D Echocardiogram has been performed.  April Walker M 08/16/2014, 2:50 PM

## 2014-08-20 ENCOUNTER — Other Ambulatory Visit: Payer: Medicare Other

## 2014-09-03 ENCOUNTER — Ambulatory Visit (HOSPITAL_BASED_OUTPATIENT_CLINIC_OR_DEPARTMENT_OTHER): Payer: Medicare Other

## 2014-09-03 ENCOUNTER — Other Ambulatory Visit (HOSPITAL_BASED_OUTPATIENT_CLINIC_OR_DEPARTMENT_OTHER): Payer: Medicare Other

## 2014-09-03 DIAGNOSIS — Z5112 Encounter for antineoplastic immunotherapy: Secondary | ICD-10-CM

## 2014-09-03 DIAGNOSIS — C50412 Malignant neoplasm of upper-outer quadrant of left female breast: Secondary | ICD-10-CM | POA: Diagnosis not present

## 2014-09-03 LAB — CBC WITH DIFFERENTIAL/PLATELET
BASO%: 1.4 % (ref 0.0–2.0)
Basophils Absolute: 0.1 10*3/uL (ref 0.0–0.1)
EOS%: 2.9 % (ref 0.0–7.0)
Eosinophils Absolute: 0.2 10*3/uL (ref 0.0–0.5)
HCT: 36.9 % (ref 34.8–46.6)
HGB: 11.9 g/dL (ref 11.6–15.9)
LYMPH%: 21.3 % (ref 14.0–49.7)
MCH: 26.7 pg (ref 25.1–34.0)
MCHC: 32.2 g/dL (ref 31.5–36.0)
MCV: 83 fL (ref 79.5–101.0)
MONO#: 0.5 10*3/uL (ref 0.1–0.9)
MONO%: 8.5 % (ref 0.0–14.0)
NEUT#: 3.7 10*3/uL (ref 1.5–6.5)
NEUT%: 65.9 % (ref 38.4–76.8)
PLATELETS: 218 10*3/uL (ref 145–400)
RBC: 4.44 10*6/uL (ref 3.70–5.45)
RDW: 14.6 % — AB (ref 11.2–14.5)
WBC: 5.6 10*3/uL (ref 3.9–10.3)
lymph#: 1.2 10*3/uL (ref 0.9–3.3)

## 2014-09-03 LAB — COMPREHENSIVE METABOLIC PANEL (CC13)
ALBUMIN: 3.6 g/dL (ref 3.5–5.0)
ALT: 13 U/L (ref 0–55)
AST: 17 U/L (ref 5–34)
Alkaline Phosphatase: 106 U/L (ref 40–150)
Anion Gap: 8 mEq/L (ref 3–11)
BUN: 18.1 mg/dL (ref 7.0–26.0)
CALCIUM: 9.7 mg/dL (ref 8.4–10.4)
CO2: 27 mEq/L (ref 22–29)
Chloride: 110 mEq/L — ABNORMAL HIGH (ref 98–109)
Creatinine: 0.8 mg/dL (ref 0.6–1.1)
EGFR: 79 mL/min/{1.73_m2} — ABNORMAL LOW (ref >90)
Glucose: 87 mg/dl (ref 70–140)
POTASSIUM: 3.9 meq/L (ref 3.5–5.1)
Sodium: 144 mEq/L (ref 136–145)
Total Bilirubin: 0.65 mg/dL (ref 0.20–1.20)
Total Protein: 6.6 g/dL (ref 6.4–8.3)

## 2014-09-03 MED ORDER — HEPARIN SOD (PORK) LOCK FLUSH 100 UNIT/ML IV SOLN
500.0000 [IU] | Freq: Once | INTRAVENOUS | Status: AC | PRN
  Administered 2014-09-03: 500 [IU]
  Filled 2014-09-03: qty 5

## 2014-09-03 MED ORDER — TRASTUZUMAB CHEMO INJECTION 440 MG
6.0000 mg/kg | Freq: Once | INTRAVENOUS | Status: AC
  Administered 2014-09-03: 609 mg via INTRAVENOUS
  Filled 2014-09-03: qty 29

## 2014-09-03 MED ORDER — SODIUM CHLORIDE 0.9 % IJ SOLN
10.0000 mL | INTRAMUSCULAR | Status: DC | PRN
  Administered 2014-09-03: 10 mL
  Filled 2014-09-03: qty 10

## 2014-09-03 MED ORDER — SODIUM CHLORIDE 0.9 % IV SOLN
Freq: Once | INTRAVENOUS | Status: AC
  Administered 2014-09-03: 10:00:00 via INTRAVENOUS

## 2014-09-03 MED ORDER — ACETAMINOPHEN 325 MG PO TABS
ORAL_TABLET | ORAL | Status: AC
  Filled 2014-09-03: qty 2

## 2014-09-03 MED ORDER — DIPHENHYDRAMINE HCL 25 MG PO CAPS
ORAL_CAPSULE | ORAL | Status: AC
  Filled 2014-09-03: qty 1

## 2014-09-03 MED ORDER — DIPHENHYDRAMINE HCL 25 MG PO CAPS
25.0000 mg | ORAL_CAPSULE | Freq: Once | ORAL | Status: AC
  Administered 2014-09-03: 25 mg via ORAL

## 2014-09-03 MED ORDER — ACETAMINOPHEN 325 MG PO TABS
650.0000 mg | ORAL_TABLET | Freq: Once | ORAL | Status: AC
  Administered 2014-09-03: 650 mg via ORAL

## 2014-09-03 NOTE — Patient Instructions (Signed)
[facility] Discharge Instructions for Patients Receiving Chemotherapy  Today you received the following chemotherapy agents Herceptin  To help prevent nausea and vomiting after your treatment, we encourage you to take your nausea medication     If you develop nausea and vomiting that is not controlled by your nausea medication, call the clinic.   BELOW ARE SYMPTOMS THAT SHOULD BE REPORTED IMMEDIATELY:  *FEVER GREATER THAN 100.5 F  *CHILLS WITH OR WITHOUT FEVER  NAUSEA AND VOMITING THAT IS NOT CONTROLLED WITH YOUR NAUSEA MEDICATION  *UNUSUAL SHORTNESS OF BREATH  *UNUSUAL BRUISING OR BLEEDING  TENDERNESS IN MOUTH AND THROAT WITH OR WITHOUT PRESENCE OF ULCERS  *URINARY PROBLEMS  *BOWEL PROBLEMS  UNUSUAL RASH Items with * indicate a potential emergency and should be followed up as soon as possible.  Feel free to call the clinic you have any questions or concerns. The clinic phone number is [phone].    

## 2014-09-17 DIAGNOSIS — H20012 Primary iridocyclitis, left eye: Secondary | ICD-10-CM | POA: Diagnosis not present

## 2014-09-19 DIAGNOSIS — H20012 Primary iridocyclitis, left eye: Secondary | ICD-10-CM | POA: Diagnosis not present

## 2014-09-21 ENCOUNTER — Inpatient Hospital Stay: Admission: RE | Admit: 2014-09-21 | Payer: Medicare Other | Source: Ambulatory Visit

## 2014-09-24 ENCOUNTER — Other Ambulatory Visit: Payer: Self-pay | Admitting: Oncology

## 2014-09-24 ENCOUNTER — Ambulatory Visit (HOSPITAL_BASED_OUTPATIENT_CLINIC_OR_DEPARTMENT_OTHER): Payer: Medicare Other

## 2014-09-24 ENCOUNTER — Other Ambulatory Visit: Payer: Medicare Other

## 2014-09-24 DIAGNOSIS — Z5112 Encounter for antineoplastic immunotherapy: Secondary | ICD-10-CM

## 2014-09-24 DIAGNOSIS — H20012 Primary iridocyclitis, left eye: Secondary | ICD-10-CM | POA: Diagnosis not present

## 2014-09-24 DIAGNOSIS — C50412 Malignant neoplasm of upper-outer quadrant of left female breast: Secondary | ICD-10-CM

## 2014-09-24 LAB — CBC WITH DIFFERENTIAL/PLATELET
BASO%: 1.3 % (ref 0.0–2.0)
BASOS ABS: 0.1 10*3/uL (ref 0.0–0.1)
EOS%: 2.1 % (ref 0.0–7.0)
Eosinophils Absolute: 0.1 10*3/uL (ref 0.0–0.5)
HCT: 37.9 % (ref 34.8–46.6)
HGB: 12.1 g/dL (ref 11.6–15.9)
LYMPH%: 24.7 % (ref 14.0–49.7)
MCH: 26.8 pg (ref 25.1–34.0)
MCHC: 32 g/dL (ref 31.5–36.0)
MCV: 83.9 fL (ref 79.5–101.0)
MONO#: 0.4 10*3/uL (ref 0.1–0.9)
MONO%: 6.5 % (ref 0.0–14.0)
NEUT#: 4.3 10*3/uL (ref 1.5–6.5)
NEUT%: 65.4 % (ref 38.4–76.8)
Platelets: 256 10*3/uL (ref 145–400)
RBC: 4.52 10*6/uL (ref 3.70–5.45)
RDW: 14.3 % (ref 11.2–14.5)
WBC: 6.6 10*3/uL (ref 3.9–10.3)
lymph#: 1.6 10*3/uL (ref 0.9–3.3)

## 2014-09-24 LAB — COMPREHENSIVE METABOLIC PANEL (CC13)
ALBUMIN: 3.9 g/dL (ref 3.5–5.0)
ALT: 15 U/L (ref 0–55)
AST: 17 U/L (ref 5–34)
Alkaline Phosphatase: 109 U/L (ref 40–150)
Anion Gap: 9 mEq/L (ref 3–11)
BUN: 10.5 mg/dL (ref 7.0–26.0)
CO2: 25 meq/L (ref 22–29)
Calcium: 9.8 mg/dL (ref 8.4–10.4)
Chloride: 107 mEq/L (ref 98–109)
Creatinine: 0.8 mg/dL (ref 0.6–1.1)
EGFR: 75 mL/min/{1.73_m2} — AB (ref >90)
Glucose: 94 mg/dl (ref 70–140)
Potassium: 4.1 mEq/L (ref 3.5–5.1)
SODIUM: 142 meq/L (ref 136–145)
Total Bilirubin: 0.68 mg/dL (ref 0.20–1.20)
Total Protein: 7.2 g/dL (ref 6.4–8.3)

## 2014-09-24 MED ORDER — HEPARIN SOD (PORK) LOCK FLUSH 100 UNIT/ML IV SOLN
500.0000 [IU] | Freq: Once | INTRAVENOUS | Status: AC | PRN
  Administered 2014-09-24: 500 [IU]
  Filled 2014-09-24: qty 5

## 2014-09-24 MED ORDER — DIPHENHYDRAMINE HCL 25 MG PO CAPS
25.0000 mg | ORAL_CAPSULE | Freq: Once | ORAL | Status: AC
  Administered 2014-09-24: 25 mg via ORAL

## 2014-09-24 MED ORDER — ACETAMINOPHEN 325 MG PO TABS
650.0000 mg | ORAL_TABLET | Freq: Once | ORAL | Status: AC
  Administered 2014-09-24: 650 mg via ORAL

## 2014-09-24 MED ORDER — TRASTUZUMAB CHEMO INJECTION 440 MG
6.0000 mg/kg | Freq: Once | INTRAVENOUS | Status: AC
  Administered 2014-09-24: 609 mg via INTRAVENOUS
  Filled 2014-09-24: qty 29

## 2014-09-24 MED ORDER — ACETAMINOPHEN 325 MG PO TABS
ORAL_TABLET | ORAL | Status: AC
  Filled 2014-09-24: qty 2

## 2014-09-24 MED ORDER — DIPHENHYDRAMINE HCL 25 MG PO CAPS
ORAL_CAPSULE | ORAL | Status: AC
  Filled 2014-09-24: qty 1

## 2014-09-24 MED ORDER — SODIUM CHLORIDE 0.9 % IJ SOLN
10.0000 mL | INTRAMUSCULAR | Status: DC | PRN
  Administered 2014-09-24: 10 mL
  Filled 2014-09-24: qty 10

## 2014-09-24 MED ORDER — SODIUM CHLORIDE 0.9 % IV SOLN
Freq: Once | INTRAVENOUS | Status: AC
  Administered 2014-09-24: 16:00:00 via INTRAVENOUS

## 2014-09-24 NOTE — Patient Instructions (Signed)
[facility] Discharge Instructions for Patients Receiving Chemotherapy  Today you received the following chemotherapy agents Herceptin  To help prevent nausea and vomiting after your treatment, we encourage you to take your nausea medication     If you develop nausea and vomiting that is not controlled by your nausea medication, call the clinic.   BELOW ARE SYMPTOMS THAT SHOULD BE REPORTED IMMEDIATELY:  *FEVER GREATER THAN 100.5 F  *CHILLS WITH OR WITHOUT FEVER  NAUSEA AND VOMITING THAT IS NOT CONTROLLED WITH YOUR NAUSEA MEDICATION  *UNUSUAL SHORTNESS OF BREATH  *UNUSUAL BRUISING OR BLEEDING  TENDERNESS IN MOUTH AND THROAT WITH OR WITHOUT PRESENCE OF ULCERS  *URINARY PROBLEMS  *BOWEL PROBLEMS  UNUSUAL RASH Items with * indicate a potential emergency and should be followed up as soon as possible.  Feel free to call the clinic you have any questions or concerns. The clinic phone number is [phone].    

## 2014-09-25 ENCOUNTER — Ambulatory Visit
Admission: RE | Admit: 2014-09-25 | Discharge: 2014-09-25 | Disposition: A | Discharge status: Home / Self Care | Payer: Medicare Other | Source: Ambulatory Visit | Attending: Oncology | Admitting: Oncology

## 2014-09-25 ENCOUNTER — Other Ambulatory Visit: Payer: Self-pay | Admitting: Nurse Practitioner

## 2014-09-25 DIAGNOSIS — Z853 Personal history of malignant neoplasm of breast: Secondary | ICD-10-CM

## 2014-09-26 ENCOUNTER — Telehealth: Payer: Self-pay | Admitting: Adult Health

## 2014-09-26 NOTE — Telephone Encounter (Signed)
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  I reviewed her medical record with her over the phone and could not find "proof" of immunizations or the results of a serum titer that she mentioned she had collected.  April Walker is a retired [employer], so I encouraged her to call Employee Health to see if they can help provide the documentation she needs.  She expressed appreciation and verbal understanding.  I encouraged her to call me with any other needs/concerns.   Mike Craze, NP [place] [phone]

## 2014-10-02 DIAGNOSIS — Z961 Presence of intraocular lens: Secondary | ICD-10-CM | POA: Diagnosis not present

## 2014-10-11 DIAGNOSIS — Z0000 Encounter for general adult medical examination without abnormal findings: Secondary | ICD-10-CM | POA: Diagnosis not present

## 2014-10-11 DIAGNOSIS — Z23 Encounter for immunization: Secondary | ICD-10-CM | POA: Diagnosis not present

## 2014-10-11 DIAGNOSIS — E780 Pure hypercholesterolemia: Secondary | ICD-10-CM | POA: Diagnosis not present

## 2014-10-15 ENCOUNTER — Other Ambulatory Visit (HOSPITAL_BASED_OUTPATIENT_CLINIC_OR_DEPARTMENT_OTHER): Payer: Medicare Other

## 2014-10-15 ENCOUNTER — Ambulatory Visit (HOSPITAL_BASED_OUTPATIENT_CLINIC_OR_DEPARTMENT_OTHER): Payer: Medicare Other

## 2014-10-15 DIAGNOSIS — C50412 Malignant neoplasm of upper-outer quadrant of left female breast: Secondary | ICD-10-CM

## 2014-10-15 DIAGNOSIS — Z5112 Encounter for antineoplastic immunotherapy: Secondary | ICD-10-CM | POA: Diagnosis not present

## 2014-10-15 LAB — COMPREHENSIVE METABOLIC PANEL (CC13)
ALT: 15 U/L (ref 0–55)
ANION GAP: 7 meq/L (ref 3–11)
AST: 17 U/L (ref 5–34)
Albumin: 3.8 g/dL (ref 3.5–5.0)
Alkaline Phosphatase: 108 U/L (ref 40–150)
BUN: 13.7 mg/dL (ref 7.0–26.0)
CALCIUM: 9.2 mg/dL (ref 8.4–10.4)
CHLORIDE: 111 meq/L — AB (ref 98–109)
CO2: 28 meq/L (ref 22–29)
CREATININE: 0.8 mg/dL (ref 0.6–1.1)
EGFR: 83 mL/min/{1.73_m2} — AB (ref >90)
GLUCOSE: 94 mg/dL (ref 70–140)
POTASSIUM: 3.6 meq/L (ref 3.5–5.1)
Sodium: 146 mEq/L — ABNORMAL HIGH (ref 136–145)
Total Bilirubin: 0.46 mg/dL (ref 0.20–1.20)
Total Protein: 6.8 g/dL (ref 6.4–8.3)

## 2014-10-15 LAB — CBC WITH DIFFERENTIAL/PLATELET
BASO%: 1.5 % (ref 0.0–2.0)
Basophils Absolute: 0.1 10*3/uL (ref 0.0–0.1)
EOS%: 4.1 % (ref 0.0–7.0)
Eosinophils Absolute: 0.3 10*3/uL (ref 0.0–0.5)
HEMATOCRIT: 36 % (ref 34.8–46.6)
HGB: 11.6 g/dL (ref 11.6–15.9)
LYMPH%: 23.2 % (ref 14.0–49.7)
MCH: 26.5 pg (ref 25.1–34.0)
MCHC: 32.2 g/dL (ref 31.5–36.0)
MCV: 82.5 fL (ref 79.5–101.0)
MONO#: 0.5 10*3/uL (ref 0.1–0.9)
MONO%: 7.7 % (ref 0.0–14.0)
NEUT#: 4 10*3/uL (ref 1.5–6.5)
NEUT%: 63.5 % (ref 38.4–76.8)
PLATELETS: 222 10*3/uL (ref 145–400)
RBC: 4.37 10*6/uL (ref 3.70–5.45)
RDW: 14.6 % — ABNORMAL HIGH (ref 11.2–14.5)
WBC: 6.3 10*3/uL (ref 3.9–10.3)
lymph#: 1.4 10*3/uL (ref 0.9–3.3)

## 2014-10-15 MED ORDER — HEPARIN SOD (PORK) LOCK FLUSH 100 UNIT/ML IV SOLN
500.0000 [IU] | Freq: Once | INTRAVENOUS | Status: AC | PRN
  Administered 2014-10-15: 500 [IU]
  Filled 2014-10-15: qty 5

## 2014-10-15 MED ORDER — ACETAMINOPHEN 325 MG PO TABS
ORAL_TABLET | ORAL | Status: AC
  Filled 2014-10-15: qty 2

## 2014-10-15 MED ORDER — DIPHENHYDRAMINE HCL 25 MG PO CAPS
25.0000 mg | ORAL_CAPSULE | Freq: Once | ORAL | Status: AC
Start: 2014-10-15 — End: 2014-10-15
  Administered 2014-10-15: 25 mg via ORAL

## 2014-10-15 MED ORDER — TRASTUZUMAB CHEMO INJECTION 440 MG
6.0000 mg/kg | Freq: Once | INTRAVENOUS | Status: AC
  Administered 2014-10-15: 609 mg via INTRAVENOUS
  Filled 2014-10-15: qty 29

## 2014-10-15 MED ORDER — SODIUM CHLORIDE 0.9 % IV SOLN
Freq: Once | INTRAVENOUS | Status: AC
  Administered 2014-10-15: 16:00:00 via INTRAVENOUS

## 2014-10-15 MED ORDER — SODIUM CHLORIDE 0.9 % IJ SOLN
10.0000 mL | INTRAMUSCULAR | Status: DC | PRN
  Administered 2014-10-15: 10 mL
  Filled 2014-10-15: qty 10

## 2014-10-15 MED ORDER — ACETAMINOPHEN 325 MG PO TABS
650.0000 mg | ORAL_TABLET | Freq: Once | ORAL | Status: AC
  Administered 2014-10-15: 650 mg via ORAL

## 2014-10-15 MED ORDER — DIPHENHYDRAMINE HCL 25 MG PO CAPS
ORAL_CAPSULE | ORAL | Status: AC
  Filled 2014-10-15: qty 1

## 2014-10-15 NOTE — Patient Instructions (Signed)

## 2014-11-05 ENCOUNTER — Ambulatory Visit (HOSPITAL_BASED_OUTPATIENT_CLINIC_OR_DEPARTMENT_OTHER): Payer: Medicare Other

## 2014-11-05 ENCOUNTER — Ambulatory Visit (HOSPITAL_BASED_OUTPATIENT_CLINIC_OR_DEPARTMENT_OTHER): Payer: Medicare Other | Admitting: Oncology

## 2014-11-05 ENCOUNTER — Telehealth: Payer: Self-pay | Admitting: Oncology

## 2014-11-05 ENCOUNTER — Other Ambulatory Visit (HOSPITAL_BASED_OUTPATIENT_CLINIC_OR_DEPARTMENT_OTHER): Payer: Medicare Other

## 2014-11-05 VITALS — BP 122/79 | HR 70 | Temp 98.4°F | Resp 18 | Ht 68.0 in | Wt 215.3 lb

## 2014-11-05 DIAGNOSIS — M25469 Effusion, unspecified knee: Secondary | ICD-10-CM

## 2014-11-05 DIAGNOSIS — C50412 Malignant neoplasm of upper-outer quadrant of left female breast: Secondary | ICD-10-CM

## 2014-11-05 DIAGNOSIS — C50919 Malignant neoplasm of unspecified site of unspecified female breast: Secondary | ICD-10-CM

## 2014-11-05 DIAGNOSIS — Z5112 Encounter for antineoplastic immunotherapy: Secondary | ICD-10-CM

## 2014-11-05 DIAGNOSIS — K5730 Diverticulosis of large intestine without perforation or abscess without bleeding: Secondary | ICD-10-CM

## 2014-11-05 LAB — COMPREHENSIVE METABOLIC PANEL (CC13)
ALBUMIN: 3.7 g/dL (ref 3.5–5.0)
ALT: 16 U/L (ref 0–55)
ANION GAP: 9 meq/L (ref 3–11)
AST: 17 U/L (ref 5–34)
Alkaline Phosphatase: 111 U/L (ref 40–150)
BUN: 19.2 mg/dL (ref 7.0–26.0)
CALCIUM: 9.4 mg/dL (ref 8.4–10.4)
CHLORIDE: 109 meq/L (ref 98–109)
CO2: 25 mEq/L (ref 22–29)
CREATININE: 0.8 mg/dL (ref 0.6–1.1)
EGFR: 82 mL/min/{1.73_m2} — ABNORMAL LOW (ref >90)
Glucose: 90 mg/dl (ref 70–140)
POTASSIUM: 3.7 meq/L (ref 3.5–5.1)
SODIUM: 143 meq/L (ref 136–145)
Total Bilirubin: 0.38 mg/dL (ref 0.20–1.20)
Total Protein: 6.8 g/dL (ref 6.4–8.3)

## 2014-11-05 LAB — CBC WITH DIFFERENTIAL/PLATELET
BASO%: 1 % (ref 0.0–2.0)
Basophils Absolute: 0.1 10*3/uL (ref 0.0–0.1)
EOS ABS: 0.1 10*3/uL (ref 0.0–0.5)
EOS%: 2.6 % (ref 0.0–7.0)
HCT: 36.9 % (ref 34.8–46.6)
HEMOGLOBIN: 12.1 g/dL (ref 11.6–15.9)
LYMPH#: 1.3 10*3/uL (ref 0.9–3.3)
LYMPH%: 24.2 % (ref 14.0–49.7)
MCH: 26.9 pg (ref 25.1–34.0)
MCHC: 32.8 g/dL (ref 31.5–36.0)
MCV: 82.2 fL (ref 79.5–101.0)
MONO#: 0.4 10*3/uL (ref 0.1–0.9)
MONO%: 7.4 % (ref 0.0–14.0)
NEUT#: 3.5 10*3/uL (ref 1.5–6.5)
NEUT%: 64.8 % (ref 38.4–76.8)
PLATELETS: 203 10*3/uL (ref 145–400)
RBC: 4.48 10*6/uL (ref 3.70–5.45)
RDW: 14.4 % (ref 11.2–14.5)
WBC: 5.4 10*3/uL (ref 3.9–10.3)

## 2014-11-05 MED ORDER — DIPHENHYDRAMINE HCL 25 MG PO CAPS
ORAL_CAPSULE | ORAL | Status: AC
  Filled 2014-11-05: qty 1

## 2014-11-05 MED ORDER — SODIUM CHLORIDE 0.9 % IV SOLN
Freq: Once | INTRAVENOUS | Status: AC
  Administered 2014-11-05: 11:00:00 via INTRAVENOUS

## 2014-11-05 MED ORDER — ACETAMINOPHEN 325 MG PO TABS
ORAL_TABLET | ORAL | Status: AC
  Filled 2014-11-05: qty 2

## 2014-11-05 MED ORDER — ACETAMINOPHEN 325 MG PO TABS
650.0000 mg | ORAL_TABLET | Freq: Once | ORAL | Status: AC
  Administered 2014-11-05: 650 mg via ORAL

## 2014-11-05 MED ORDER — SODIUM CHLORIDE 0.9 % IJ SOLN
10.0000 mL | INTRAMUSCULAR | Status: DC | PRN
  Administered 2014-11-05: 10 mL
  Filled 2014-11-05: qty 10

## 2014-11-05 MED ORDER — ACETAMINOPHEN 325 MG PO TABS
ORAL_TABLET | ORAL | Status: AC
  Filled 2014-11-05: qty 1

## 2014-11-05 MED ORDER — HEPARIN SOD (PORK) LOCK FLUSH 100 UNIT/ML IV SOLN
500.0000 [IU] | Freq: Once | INTRAVENOUS | Status: AC | PRN
  Administered 2014-11-05: 500 [IU]
  Filled 2014-11-05: qty 5

## 2014-11-05 MED ORDER — DIPHENHYDRAMINE HCL 25 MG PO CAPS
25.0000 mg | ORAL_CAPSULE | Freq: Once | ORAL | Status: AC
  Administered 2014-11-05: 25 mg via ORAL

## 2014-11-05 MED ORDER — TRASTUZUMAB CHEMO INJECTION 440 MG
6.0000 mg/kg | Freq: Once | INTRAVENOUS | Status: AC
  Administered 2014-11-05: 609 mg via INTRAVENOUS
  Filled 2014-11-05: qty 29

## 2014-11-05 NOTE — Progress Notes (Signed)
April Walker  Telephone:(336) [phone] Fax:(336) [phone]     ID: [name] OB: 05/18/1951  MR#: [id number]  HLK#:[id number]  PCP: Criselda Peaches, MD GYN:  Vania Rea SU: Stark Klein  OTHER MD: [name], Silvano Rusk, Arther Abbott  CHIEF COMPLAINT: Left Breast Cancer, locally advanced TREATMENT: adjuvant trastuzumab, anastrozole   BREAST CANCER HISTORY: From the original integument:  April Walker had routine screening mammography at [facility] 09/14/2013. This showed a possible mass in the left breast. On 10/11/2013 she underwent left diagnostic mammography and ultrasonography which confirmed an irregular mass in the upper outer left breast measuring up to 3 cm. This was firm and fixed at the 2:30 o'clock position, 6 cm from the nipple. Ultrasound showed an irregular hypoechoic mass measuring 2.8 cm. The left axilla showed no evidence of adenopathy.  On 10/11/2013 the patient underwent left breast biopsy, with the pathology [facility] [id number]) showing an invasive ductal carcinoma, high-grade, estrogen receptor 100% positive, progesterone receptor 43% positive, both with strong staining intensity, with an MIB-1 of 87% and HER-2 amplified, the signals ratio being 2.72, the number per cell 3.40.  On 10/23/2013 the patient underwent bilateral breast MRI. This showed the breast and position to be density be. In the left breast there was a 3 cm irregular enhancing mass in the upper outer quadrant. There was a 9 mm oval mass in the inner lower left breast with no definite fatty hilum. There was a 1.4 cm upper left axillary lymph node with a thickened cortex. There were no other abnormal appearing lymph nodes. Ultrasound of the left breast 10/30/2013 found of the left axillary lymph node with the mildly asymmetrical cortex, and a 7 mm her mass in the 7:30 o'clock position of the left breast. Biopsy of both these suspicious areas was performed 10/30/2013, and the preliminary  report is that they're both benign.  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 She continues on trastuzumab every 3 weeks, with the penultimate dose today. She has tolerated this well, with no evidence of cardiomyopathy. She is also on anastrozole daily. She really has no symptoms from that medication that she is aware of. She obtains it at $2 a month.  REVIEW OF SYSTEMS: Shahida exercises regularly through a gym and also at her church. After she gets the trastuzumab she feels tired for a day or 2, and usually takes a nap those days. She has pain on the top part of her feet bilaterally. This was present before she started any treatment for her breast cancer. It improves with activity. It is worse after she has been sitting for while. Aside from this a detailed review of systems today was entirely noncontributory  PAST MEDICAL HISTORY: Past Medical History  Diagnosis Date  . Abnormal cholesterol test   . Hyperlipidemia   . Knee pain, left   . GERD (gastroesophageal reflux disease)   . Arthritis   . Tubulovillous adenoma of colon   . Diverticulosis   . Internal hemorrhoids   . Wears glasses   . Hearing deficit     no hearing aids  . Radiation 05/02/14-06/19/14    Left Breast    PAST SURGICAL HISTORY: Past Surgical History  Procedure Laterality Date  . Tonsillectomy and adenoidectomy    . Right knee arthroscopy  2011 Dr. Aline Brochure  . Treatment fistula anal    . Upper gastrointestinal endoscopy    .  Hemorrhoid surgery    . Colonoscopy  2002    hemorrhoids  . Colonoscopy w/ polypectomy  12/09/2010    1 cm sigmoid polyp - , diverticulosis, hemorrhoids  . Knee surgery    . Knee surgery  2012    Left knee arthroscopy  . Total knee arthroplasty  11/23/2011    Procedure: TOTAL KNEE ARTHROPLASTY;  Surgeon: Carole Civil, MD;  Location: [facility];  Service: Orthopedics;  Laterality:  Left;  . Portacath placement Left 11/17/2013    Procedure: INSERTION PORT-A-CATH;  Surgeon: Stark Klein, MD;  Location: [place];  Service: General;  Laterality: Left;  . Partial mastectomy with needle localization and axillary sentinel lymph node bx Left 03/21/14    Dr.Faera Byerly    FAMILY HISTORY Family History  Problem Relation Age of Onset  . Adopted: Yes  . Stroke Mother   . Heart attack Father    the patient is adopted and has no information on her biological family. the patient's adoptive father died at the age of 66 from a myocardial infarction; productive mother died from a stroke at age 41. The patient does not know of any siblings  GYNECOLOGIC HISTORY: (Reviewed 12/18/2013) Menarche age 64. The patient is GX P0. She underwent menopause in her early 59s and took hormone replacement until 2010.  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 She is single, and lives at home with her black lab, [name], and her cats Patches and [name]    ADVANCED DIRECTIVES: Not in place   HEALTH MAINTENANCE:  (Reviewed 12/18/2013) History  Substance Use Topics  . Smoking status: Never Smoker   . Smokeless tobacco: Never Used  . Alcohol Use: Yes     Comment: socially     Colonoscopy: 2012/[name]  PAP: 2014/[name]  Bone density: not on file  Lipid panel: not on file   Allergies  Allergen Reactions  . Sulfonamide Derivatives Anaphylaxis and Hives    Pt. Stated, "large welps on neck immediately."  . Triprolidine-Pseudoephedrine Other (See Comments)    This is Actifed. Increased heart rate to tachycardia.    Current Outpatient Prescriptions  Medication Sig Dispense Refill  . Acetaminophen (TYLENOL EXTRA STRENGTH PO) Take by mouth.    Marland Kitchen anastrozole (ARIMIDEX) 1 MG tablet TAKE 1 TABLET (1 MG TOTAL) BY MOUTH DAILY. 30 tablet 5  . gabapentin (NEURONTIN) 300 MG capsule Take 1 capsule  (300 mg total) by mouth at bedtime. (Patient taking differently: Take 300 mg by mouth at bedtime. Pt taking this medication every other night.) 30 capsule 3  . hydrocortisone (ANUSOL-HC) 25 MG suppository Place 1 suppository (25 mg total) rectally at bedtime as needed for hemorrhoids or itching. (Patient not taking: Reported on 08/13/2014) 10 suppository 0  . loratadine (CLARITIN) 10 MG tablet Take 10 mg by mouth daily as needed. For allergies    . omeprazole (PRILOSEC) 20 MG capsule Take 20 mg by mouth as needed.    . simvastatin (ZOCOR) 10 MG tablet Take 10 mg by mouth daily. Pt TAKES AT BEDTIME     No current facility-administered medications for this visit.    OBJECTIVE: Middle-aged white woman Who appears stated age 38 Vitals:   11/05/14 0927  BP: 122/79  Pulse: 70  Temp: 98.4 F (36.9 C)  Resp: 18     Body mass index is 32.74 kg/(m^2).    ECOG FS:1 - Symptomatic but completely  ambulatory Filed Weights   11/05/14 0927  Weight: 215 lb 4.8 oz (97.659 kg)   Sclerae unicteric, pupils round and equal Oropharynx clear and moist-- no thrushor other lesions No cervical or supraclavicular adenopathy Lungs no rales or rhonchi Heart regular rate and rhythm Abd soft, nontender, positive bowel sounds MSK no focal spinal tenderness, no upper extremity lymphedema Neuro: nonfocal, well oriented, positive affect Breasts: the right breast is unremarkable. The left breast is status post lumpectomy and radiation. There is no evidence of local recurrence. Left axilla is benign.    LAB RESULTS:   Lab Results  Component Value Date   WBC 5.4 11/05/2014   NEUTROABS 3.5 11/05/2014   HGB 12.1 11/05/2014   HCT 36.9 11/05/2014   MCV 82.2 11/05/2014   PLT 203 11/05/2014      Chemistry      Component Value Date/Time   NA 143 11/05/2014 0910   NA 143 03/21/2014 0801   K 3.7 11/05/2014 0910   K 3.2* 03/21/2014 0801   CL 108 03/21/2014 0801   CO2 25 11/05/2014 0910   CO2 25 02/12/2014  1003   BUN 19.2 11/05/2014 0910   BUN 8 03/21/2014 0801   CREATININE 0.8 11/05/2014 0910   CREATININE 0.60 03/21/2014 0801      Component Value Date/Time   CALCIUM 9.4 11/05/2014 0910   CALCIUM 10.0 02/12/2014 1003   ALKPHOS 111 11/05/2014 0910   ALKPHOS 92 02/12/2014 1003   AST 17 11/05/2014 0910   AST 16 02/12/2014 1003   ALT 16 11/05/2014 0910   ALT 16 02/12/2014 1003   BILITOT 0.38 11/05/2014 0910   BILITOT 0.4 02/12/2014 1003       STUDIES: No results found.   ASSESSMENT: 64 y.o. [place] woman status post left breast biopsy 10/11/2013 for a clinical T2 N0, stage IIA invasive ductal carcinoma, grade 3, estrogen and progesterone receptor positive, with an MIB-1 of 87%, and HER-2 amplified  (a) additional Left breast and left axillary node biopsy 10/30/2013 benign  (1) started neoadjuvant therapy 11/20/2013 , consisting of carboplatin, docetaxel, trastuzumab and pertuzumab every 3 weeks x6 completed 03/05/2014   (2) trastuzumab alone to continue to complete a year; most recent echocardiogram 01/24/2014 shows a well preserved ejection fraction.  (3) Patient underwent left lumpectomy with sentinel node biopsy on 03/21/14. Pathology results demonstrated a complete response, pT0 pN0  (4) radiation to follow surgery   (5) anastrozole started 07/06/14   (a) DEXA scan 07/03/2014 was normal, with the lowest T score -1.0  PLAN: Jarika we'll complete her year of trastuzumab with the next dose which will be 11/26/2014. We will schedule her for an echocardiogram sometime in June to document what we expect to be a normal ejection fraction post treatment. I have let her surgeon know that she can remove the port anytime after that.  She will continue on anastrozole, the plan Beingfor 5 years on that aromatase inhibitor. We discussed vitamin D supplementation, plus or minus calcium supplementation, and the importance of weightbearing exercise, which she is already doing regularly.  She  gets mammography in March. We will see her in September and March until she completes her 5 years of follow-up.  I don't think the pain she has in her feet is due to neuropathy. That would be on the toes and, and this pain is on the upper part of the foot. Furthermore it improves with walking, and gets worse with rest. This is just arthritis and there is not much she  can do about it other than making sure her shoes have good support and that she does continue to walk (she is training for a 5K).  Khaniya has a good understanding of the overall plan. She agrees with it. She knows the goal of treatment in her case is cure. She will call with any problems that may develop before her next visit here.   Chauncey Cruel, MD  11/05/2014 10:04 AM

## 2014-11-05 NOTE — Patient Instructions (Signed)
[facility] Discharge Instructions for Patients Receiving Chemotherapy  Today you received the following chemotherapy agents:  Herceptin  To help prevent nausea and vomiting after your treatment, we encourage you to take your nausea medication as prescribed.   If you develop nausea and vomiting that is not controlled by your nausea medication, call the clinic.   BELOW ARE SYMPTOMS THAT SHOULD BE REPORTED IMMEDIATELY:  *FEVER GREATER THAN 100.5 F  *CHILLS WITH OR WITHOUT FEVER  NAUSEA AND VOMITING THAT IS NOT CONTROLLED WITH YOUR NAUSEA MEDICATION  *UNUSUAL SHORTNESS OF BREATH  *UNUSUAL BRUISING OR BLEEDING  TENDERNESS IN MOUTH AND THROAT WITH OR WITHOUT PRESENCE OF ULCERS  *URINARY PROBLEMS  *BOWEL PROBLEMS  UNUSUAL RASH Items with * indicate a potential emergency and should be followed up as soon as possible.  Feel free to call the clinic you have any questions or concerns. The clinic phone number is [phone].  Please show the CHEMO ALERT CARD at check-in to the Emergency Department and triage nurse.   

## 2014-11-05 NOTE — Telephone Encounter (Signed)
appointments made and and avs printed for patient

## 2014-11-06 ENCOUNTER — Telehealth: Payer: Self-pay | Admitting: Oncology

## 2014-11-06 ENCOUNTER — Telehealth: Payer: Self-pay

## 2014-11-06 ENCOUNTER — Telehealth: Payer: Self-pay | Admitting: General Practice

## 2014-11-06 NOTE — Telephone Encounter (Signed)
Echo results rcvd from [place] dated 08/16/14.  Reviewed by Dr. Jana Hakim.  Sent to scan.   Copy provided to patient.

## 2014-11-06 NOTE — Telephone Encounter (Signed)
Called patient with echo and d bensimhon appointment

## 2014-11-13 ENCOUNTER — Other Ambulatory Visit: Payer: Self-pay | Admitting: General Surgery

## 2014-11-14 ENCOUNTER — Encounter: Payer: Self-pay | Admitting: Adult Health

## 2014-11-14 NOTE — Progress Notes (Signed)
A birthday card was mailed to the patient today on behalf of the Survivorship Program at [facility].   Kriya Westra, NP Survivorship Program [facility] [phone]  

## 2014-11-26 ENCOUNTER — Ambulatory Visit (HOSPITAL_BASED_OUTPATIENT_CLINIC_OR_DEPARTMENT_OTHER): Payer: Medicare Other

## 2014-11-26 VITALS — BP 131/55 | HR 78 | Temp 98.4°F | Resp 16

## 2014-11-26 DIAGNOSIS — C50412 Malignant neoplasm of upper-outer quadrant of left female breast: Secondary | ICD-10-CM

## 2014-11-26 DIAGNOSIS — Z5112 Encounter for antineoplastic immunotherapy: Secondary | ICD-10-CM | POA: Diagnosis not present

## 2014-11-26 LAB — CBC WITH DIFFERENTIAL/PLATELET
BASO%: 0.9 % (ref 0.0–2.0)
BASOS ABS: 0.1 10*3/uL (ref 0.0–0.1)
EOS%: 2.6 % (ref 0.0–7.0)
Eosinophils Absolute: 0.2 10*3/uL (ref 0.0–0.5)
HCT: 37.1 % (ref 34.8–46.6)
HGB: 12.2 g/dL (ref 11.6–15.9)
LYMPH%: 22 % (ref 14.0–49.7)
MCH: 27.2 pg (ref 25.1–34.0)
MCHC: 33 g/dL (ref 31.5–36.0)
MCV: 82.4 fL (ref 79.5–101.0)
MONO#: 0.4 10*3/uL (ref 0.1–0.9)
MONO%: 5.4 % (ref 0.0–14.0)
NEUT#: 4.7 10*3/uL (ref 1.5–6.5)
NEUT%: 69.1 % (ref 38.4–76.8)
Platelets: 219 10*3/uL (ref 145–400)
RBC: 4.5 10*6/uL (ref 3.70–5.45)
RDW: 14.2 % (ref 11.2–14.5)
WBC: 6.8 10*3/uL (ref 3.9–10.3)
lymph#: 1.5 10*3/uL (ref 0.9–3.3)

## 2014-11-26 LAB — COMPREHENSIVE METABOLIC PANEL (CC13)
ALBUMIN: 3.7 g/dL (ref 3.5–5.0)
ALK PHOS: 115 U/L (ref 40–150)
ALT: 18 U/L (ref 0–55)
ANION GAP: 11 meq/L (ref 3–11)
AST: 20 U/L (ref 5–34)
BILIRUBIN TOTAL: 0.42 mg/dL (ref 0.20–1.20)
BUN: 16.6 mg/dL (ref 7.0–26.0)
CALCIUM: 8.9 mg/dL (ref 8.4–10.4)
CHLORIDE: 107 meq/L (ref 98–109)
CO2: 25 meq/L (ref 22–29)
CREATININE: 0.9 mg/dL (ref 0.6–1.1)
EGFR: 65 mL/min/{1.73_m2} — ABNORMAL LOW (ref >90)
GLUCOSE: 130 mg/dL (ref 70–140)
Potassium: 4 mEq/L (ref 3.5–5.1)
SODIUM: 143 meq/L (ref 136–145)
Total Protein: 6.8 g/dL (ref 6.4–8.3)

## 2014-11-26 MED ORDER — DIPHENHYDRAMINE HCL 25 MG PO CAPS
25.0000 mg | ORAL_CAPSULE | Freq: Once | ORAL | Status: AC
  Administered 2014-11-26: 25 mg via ORAL

## 2014-11-26 MED ORDER — DIPHENHYDRAMINE HCL 25 MG PO CAPS
ORAL_CAPSULE | ORAL | Status: AC
Start: 2014-11-26 — End: 2014-11-26
  Filled 2014-11-26: qty 1

## 2014-11-26 MED ORDER — ACETAMINOPHEN 325 MG PO TABS
650.0000 mg | ORAL_TABLET | Freq: Once | ORAL | Status: AC
  Administered 2014-11-26: 650 mg via ORAL

## 2014-11-26 MED ORDER — SODIUM CHLORIDE 0.9 % IJ SOLN
10.0000 mL | INTRAMUSCULAR | Status: DC | PRN
  Administered 2014-11-26: 10 mL
  Filled 2014-11-26: qty 10

## 2014-11-26 MED ORDER — TRASTUZUMAB CHEMO INJECTION 440 MG
6.0000 mg/kg | Freq: Once | INTRAVENOUS | Status: AC
  Administered 2014-11-26: 609 mg via INTRAVENOUS
  Filled 2014-11-26: qty 29

## 2014-11-26 MED ORDER — ACETAMINOPHEN 325 MG PO TABS
ORAL_TABLET | ORAL | Status: AC
  Filled 2014-11-26: qty 2

## 2014-11-26 MED ORDER — HEPARIN SOD (PORK) LOCK FLUSH 100 UNIT/ML IV SOLN
500.0000 [IU] | Freq: Once | INTRAVENOUS | Status: AC | PRN
  Administered 2014-11-26: 500 [IU]
  Filled 2014-11-26: qty 5

## 2014-11-26 MED ORDER — SODIUM CHLORIDE 0.9 % IV SOLN
Freq: Once | INTRAVENOUS | Status: AC
  Administered 2014-11-26: 16:00:00 via INTRAVENOUS

## 2014-11-27 ENCOUNTER — Ambulatory Visit (HOSPITAL_BASED_OUTPATIENT_CLINIC_OR_DEPARTMENT_OTHER)
Admission: RE | Admit: 2014-11-27 | Discharge: 2014-11-27 | Disposition: A | Discharge status: Home / Self Care | Payer: Medicare Other | Source: Ambulatory Visit | Attending: Cardiology | Admitting: Cardiology

## 2014-11-27 ENCOUNTER — Ambulatory Visit (HOSPITAL_COMMUNITY)
Admission: RE | Admit: 2014-11-27 | Discharge: 2014-11-27 | Disposition: A | Discharge status: Home / Self Care | Payer: Medicare Other | Source: Ambulatory Visit | Attending: Internal Medicine | Admitting: Internal Medicine

## 2014-11-27 ENCOUNTER — Encounter (HOSPITAL_COMMUNITY): Payer: Self-pay

## 2014-11-27 VITALS — BP 131/79 | HR 69 | Wt 217.0 lb

## 2014-11-27 DIAGNOSIS — C50412 Malignant neoplasm of upper-outer quadrant of left female breast: Secondary | ICD-10-CM

## 2014-11-27 DIAGNOSIS — C50919 Malignant neoplasm of unspecified site of unspecified female breast: Secondary | ICD-10-CM

## 2014-11-27 DIAGNOSIS — I340 Nonrheumatic mitral (valve) insufficiency: Secondary | ICD-10-CM | POA: Diagnosis not present

## 2014-11-27 DIAGNOSIS — I351 Nonrheumatic aortic (valve) insufficiency: Secondary | ICD-10-CM | POA: Insufficient documentation

## 2014-11-27 NOTE — Progress Notes (Signed)
Echocardiogram 2D Echocardiogram has been performed.  Joelene Millin 11/27/2014, 10:12 AM

## 2014-11-27 NOTE — Progress Notes (Signed)
Patient ID: April Walker, female   DOB: Mar 02, 1951, 64 y.o.   MRN: [id number] PCP: Dr. Levin Erp Oncologist: Dr. Jana Hakim  64 yo with diagnosis of breast cancer presents for cardio-oncology followup.  Left breast mass was found in 3/15 by mammogram, biopsy with ER+/PR+/HER2-neu amplified locally advanced left breast cancer.  Patient is to undergo 6 cycles docetaxel/carboplatin/trastuzumab/pertuzumab then trastuzumab x 1 year.  She will have surgery after chemotherapy then radiation.   She has done well so far. She finished Herceptin with the last dose yesterday.  She has been doing well overall.  Mild dyspnea walking up a hill, no problems on flat ground.  No chest pain.  Some fatigue.   PMH: 1. Breast cancer: Left breast mass found 3/15 by mammogram, biopsy with ER+/PR+/HER2-neu amplified locally advanced left breast cancer.  Patient is to undergo 6 cycles docetaxel/carboplatin/trastuzumab/pertuzumab then trastuzumab x 1 year.  She will have surgery after chemotherapy then radiation.  - Echo (5/15) with EF 55-60%, GLS -18%. - Echo (7/15) with EF 60-65%, lateral s' 11.4, GLS -19.4%. - Echo (11/15) EF 55-60% lateral s' 11.2 GLS -17.4 - Echo (2/16) EF 55-60%, normal RV, GLS -15%, lateral s' 8 (difficult images) - Echo (5/16) EF 55-60%, lateral s' 9.5, GLS -14.7% (difficult images for strain).  2. Hyperlipidemia 3. GERD 4. Colonic adenoma 5. OA: Left TKA 6. Chest pain: Prior stress test several years ago was normal.  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   FH: Adopted, unsure.   ROS: All systems reviewed and negative except as per HPI.   Current Outpatient Prescriptions  Medication Sig Dispense Refill  . Acetaminophen (TYLENOL EXTRA STRENGTH PO) Take by mouth.    Marland Kitchen anastrozole (ARIMIDEX) 1 MG tablet TAKE 1 TABLET (1 MG TOTAL) BY MOUTH DAILY. 30 tablet 5  . hydrocortisone (ANUSOL-HC) 25 MG suppository Place 1 suppository (25 mg total) rectally at bedtime as  needed for hemorrhoids or itching. 10 suppository 0  . loratadine (CLARITIN) 10 MG tablet Take 10 mg by mouth daily as needed. For allergies    . omeprazole (PRILOSEC) 20 MG capsule Take 20 mg by mouth as needed.    . simvastatin (ZOCOR) 10 MG tablet Take 10 mg by mouth daily. Pt TAKES AT BEDTIME     No current facility-administered medications for this encounter.    BP 131/79 mmHg  Pulse 69  Wt 217 lb (98.431 kg)  SpO2 98% General: NAD Neck: No JVD, no thyromegaly or thyroid nodule.  Lungs: Clear to auscultation bilaterally with normal respiratory effort. CV: Nondisplaced PMI.  Heart regular S1/S2, no S3/S4, no murmur.  No carotid bruit.  Normal pedal pulses.  Abdomen: obese. soft, nontender, no hepatosplenomegaly, no distention.  Skin: Intact without lesions or rashes.  Neurologic: Alert and oriented x 3.  Psych: Normal affect. Extremities: No clubbing or cyanosis. Tr-1+edema L>R  HEENT: Normal.   Assessment/Plan: Left breast CA:  I reviewed her echo.  EF and Doppler parameters stable.  Images were difficult.  Strain was low (similar to prior) but not sure measurements were accurate.  She has now completed Herceptin. No HF on exam. If she develops exertional dyspnea, she will call us back for followup.  Otherwise, I think she can be seen prn.    Loralie Champagne 11/27/2014

## 2014-12-28 ENCOUNTER — Encounter (HOSPITAL_BASED_OUTPATIENT_CLINIC_OR_DEPARTMENT_OTHER): Payer: Self-pay | Admitting: *Deleted

## 2014-12-31 ENCOUNTER — Other Ambulatory Visit: Payer: Self-pay

## 2015-01-01 ENCOUNTER — Ambulatory Visit (HOSPITAL_BASED_OUTPATIENT_CLINIC_OR_DEPARTMENT_OTHER)
Admission: RE | Admit: 2015-01-01 | Discharge: 2015-01-01 | Disposition: A | Discharge status: Home / Self Care | Payer: Medicare Other | Source: Ambulatory Visit | Attending: General Surgery | Admitting: General Surgery

## 2015-01-01 ENCOUNTER — Encounter (HOSPITAL_BASED_OUTPATIENT_CLINIC_OR_DEPARTMENT_OTHER): Payer: Self-pay | Admitting: *Deleted

## 2015-01-01 ENCOUNTER — Encounter (HOSPITAL_BASED_OUTPATIENT_CLINIC_OR_DEPARTMENT_OTHER)
Admission: RE | Disposition: A | Discharge status: Home / Self Care | Payer: Self-pay | Source: Ambulatory Visit | Attending: General Surgery

## 2015-01-01 ENCOUNTER — Ambulatory Visit (HOSPITAL_BASED_OUTPATIENT_CLINIC_OR_DEPARTMENT_OTHER): Payer: Medicare Other | Admitting: Anesthesiology

## 2015-01-01 DIAGNOSIS — E785 Hyperlipidemia, unspecified: Secondary | ICD-10-CM | POA: Insufficient documentation

## 2015-01-01 DIAGNOSIS — Z9221 Personal history of antineoplastic chemotherapy: Secondary | ICD-10-CM | POA: Diagnosis not present

## 2015-01-01 DIAGNOSIS — Z888 Allergy status to other drugs, medicaments and biological substances status: Secondary | ICD-10-CM | POA: Diagnosis not present

## 2015-01-01 DIAGNOSIS — Z853 Personal history of malignant neoplasm of breast: Secondary | ICD-10-CM | POA: Insufficient documentation

## 2015-01-01 DIAGNOSIS — Z882 Allergy status to sulfonamides status: Secondary | ICD-10-CM | POA: Insufficient documentation

## 2015-01-01 DIAGNOSIS — Z86010 Personal history of colonic polyps: Secondary | ICD-10-CM | POA: Diagnosis not present

## 2015-01-01 DIAGNOSIS — Z452 Encounter for adjustment and management of vascular access device: Secondary | ICD-10-CM | POA: Insufficient documentation

## 2015-01-01 DIAGNOSIS — K219 Gastro-esophageal reflux disease without esophagitis: Secondary | ICD-10-CM | POA: Insufficient documentation

## 2015-01-01 DIAGNOSIS — K648 Other hemorrhoids: Secondary | ICD-10-CM | POA: Diagnosis not present

## 2015-01-01 DIAGNOSIS — M1990 Unspecified osteoarthritis, unspecified site: Secondary | ICD-10-CM | POA: Insufficient documentation

## 2015-01-01 DIAGNOSIS — Z923 Personal history of irradiation: Secondary | ICD-10-CM | POA: Insufficient documentation

## 2015-01-01 DIAGNOSIS — C50919 Malignant neoplasm of unspecified site of unspecified female breast: Secondary | ICD-10-CM | POA: Diagnosis not present

## 2015-01-01 HISTORY — DX: Malignant (primary) neoplasm, unspecified: C80.1

## 2015-01-01 HISTORY — PX: PORT-A-CATH REMOVAL: SHX5289

## 2015-01-01 LAB — POCT HEMOGLOBIN-HEMACUE: Hemoglobin: 12.6 g/dL (ref 12.0–15.0)

## 2015-01-01 SURGERY — REMOVAL PORT-A-CATH
Anesthesia: General | Laterality: Left

## 2015-01-01 MED ORDER — MIDAZOLAM HCL 2 MG/2ML IJ SOLN
INTRAMUSCULAR | Status: AC
  Filled 2015-01-01: qty 2

## 2015-01-01 MED ORDER — SCOPOLAMINE 1 MG/3DAYS TD PT72: 1.0000 | MEDICATED_PATCH | Freq: Once | TRANSDERMAL | Status: DC | PRN

## 2015-01-01 MED ORDER — PROPOFOL INFUSION 10 MG/ML OPTIME
INTRAVENOUS | Status: DC | PRN
  Administered 2015-01-01: 50 ug/kg/min via INTRAVENOUS

## 2015-01-01 MED ORDER — FENTANYL CITRATE (PF) 100 MCG/2ML IJ SOLN: 25.0000 ug | INTRAMUSCULAR | Status: DC | PRN

## 2015-01-01 MED ORDER — MIDAZOLAM HCL 2 MG/2ML IJ SOLN: 1.0000 mg | INTRAMUSCULAR | Status: DC | PRN

## 2015-01-01 MED ORDER — FENTANYL CITRATE (PF) 100 MCG/2ML IJ SOLN: 50.0000 ug | INTRAMUSCULAR | Status: DC | PRN

## 2015-01-01 MED ORDER — FENTANYL CITRATE (PF) 100 MCG/2ML IJ SOLN
INTRAMUSCULAR | Status: DC | PRN
  Administered 2015-01-01 (×2): 50 ug via INTRAVENOUS

## 2015-01-01 MED ORDER — PROPOFOL 500 MG/50ML IV EMUL
INTRAVENOUS | Status: AC
  Filled 2015-01-01: qty 50

## 2015-01-01 MED ORDER — PROPOFOL 10 MG/ML IV BOLUS
INTRAVENOUS | Status: AC
  Filled 2015-01-01: qty 60

## 2015-01-01 MED ORDER — LIDOCAINE HCL (CARDIAC) 20 MG/ML IV SOLN
INTRAVENOUS | Status: DC | PRN
  Administered 2015-01-01: 10 mg via INTRAVENOUS

## 2015-01-01 MED ORDER — LIDOCAINE-EPINEPHRINE (PF) 1 %-1:200000 IJ SOLN
INTRAMUSCULAR | Status: DC | PRN
  Administered 2015-01-01: 10 mL via INTRAMUSCULAR

## 2015-01-01 MED ORDER — LACTATED RINGERS IV SOLN
INTRAVENOUS | Status: DC
  Administered 2015-01-01: 07:00:00 via INTRAVENOUS

## 2015-01-01 MED ORDER — BUPIVACAINE HCL (PF) 0.25 % IJ SOLN: INTRAMUSCULAR | Status: DC | PRN

## 2015-01-01 MED ORDER — CEFAZOLIN SODIUM-DEXTROSE 2-3 GM-% IV SOLR
2.0000 g | INTRAVENOUS | Status: AC
  Administered 2015-01-01: 2 g via INTRAVENOUS

## 2015-01-01 MED ORDER — GLYCOPYRROLATE 0.2 MG/ML IJ SOLN: 0.2000 mg | Freq: Once | INTRAMUSCULAR | Status: DC | PRN

## 2015-01-01 MED ORDER — LIDOCAINE-EPINEPHRINE (PF) 1 %-1:200000 IJ SOLN
INTRAMUSCULAR | Status: AC
  Filled 2015-01-01: qty 10

## 2015-01-01 MED ORDER — MIDAZOLAM HCL 5 MG/5ML IJ SOLN
INTRAMUSCULAR | Status: DC | PRN
  Administered 2015-01-01: 2 mg via INTRAVENOUS

## 2015-01-01 MED ORDER — FENTANYL CITRATE (PF) 100 MCG/2ML IJ SOLN
INTRAMUSCULAR | Status: AC
Start: 2015-01-01 — End: 2015-01-01
  Filled 2015-01-01: qty 4

## 2015-01-01 MED ORDER — LIDOCAINE-EPINEPHRINE (PF) 1 %-1:200000 IJ SOLN: INTRAMUSCULAR | Status: DC | PRN

## 2015-01-01 SURGICAL SUPPLY — 35 items
BLADE HEX COATED 2.75 (ELECTRODE) ×2 IMPLANT
BLADE SURG 15 STRL LF DISP TIS (BLADE) ×1 IMPLANT
BLADE SURG 15 STRL SS (BLADE) ×2
CANISTER SUCT 1200ML W/VALVE (MISCELLANEOUS) IMPLANT
CHLORAPREP W/TINT 26ML (MISCELLANEOUS) ×2 IMPLANT
COVER BACK TABLE 60X90IN (DRAPES) ×2 IMPLANT
COVER MAYO STAND STRL (DRAPES) ×2 IMPLANT
DECANTER SPIKE VIAL GLASS SM (MISCELLANEOUS) IMPLANT
DRAPE LAPAROTOMY 100X72 PEDS (DRAPES) ×2 IMPLANT
DRAPE UTILITY XL STRL (DRAPES) ×2 IMPLANT
ELECT REM PT RETURN 9FT ADLT (ELECTROSURGICAL) ×2
ELECTRODE REM PT RTRN 9FT ADLT (ELECTROSURGICAL) ×1 IMPLANT
GLOVE BIO SURGEON STRL SZ 6 (GLOVE) ×2 IMPLANT
GLOVE BIOGEL PI IND STRL 6.5 (GLOVE) ×1 IMPLANT
GLOVE BIOGEL PI IND STRL 7.0 (GLOVE) IMPLANT
GLOVE BIOGEL PI INDICATOR 6.5 (GLOVE) ×1
GLOVE BIOGEL PI INDICATOR 7.0 (GLOVE) ×1
GLOVE ECLIPSE 6.5 STRL STRAW (GLOVE) ×1 IMPLANT
GOWN STRL REUS W/ TWL LRG LVL3 (GOWN DISPOSABLE) ×1 IMPLANT
GOWN STRL REUS W/TWL 2XL LVL3 (GOWN DISPOSABLE) ×2 IMPLANT
GOWN STRL REUS W/TWL LRG LVL3 (GOWN DISPOSABLE) ×2
LIQUID BAND (GAUZE/BANDAGES/DRESSINGS) ×2 IMPLANT
NDL HYPO 25X1 1.5 SAFETY (NEEDLE) ×1 IMPLANT
NEEDLE HYPO 25X1 1.5 SAFETY (NEEDLE) ×2 IMPLANT
NS IRRIG 1000ML POUR BTL (IV SOLUTION) IMPLANT
PACK BASIN DAY SURGERY FS (CUSTOM PROCEDURE TRAY) ×2 IMPLANT
PENCIL BUTTON HOLSTER BLD 10FT (ELECTRODE) ×2 IMPLANT
SUT MNCRL AB 4-0 PS2 18 (SUTURE) ×2 IMPLANT
SUT VIC AB 3-0 SH 27 (SUTURE) ×2
SUT VIC AB 3-0 SH 27X BRD (SUTURE) ×1 IMPLANT
SYR CONTROL 10ML LL (SYRINGE) ×2 IMPLANT
TOWEL OR 17X24 6PK STRL BLUE (TOWEL DISPOSABLE) ×2 IMPLANT
TOWEL OR NON WOVEN STRL DISP B (DISPOSABLE) ×2 IMPLANT
TUBE CONNECTING 20X1/4 (TUBING) IMPLANT
YANKAUER SUCT BULB TIP NO VENT (SUCTIONS) IMPLANT

## 2015-01-01 NOTE — Op Note (Signed)
  PRE-OPERATIVE DIAGNOSIS:  un-needed Port-A-Cath for left breast cancer  POST-OPERATIVE DIAGNOSIS:  Same   PROCEDURE:  Procedure(s):  REMOVAL Left subclavian PORT-A-CATH  SURGEON:  Surgeon(s):  Stark Klein, MD  ANESTHESIA:   MAC + local  EBL:   Minimal  SPECIMEN:  None  Complications : none known  Procedure:   Pt was  identified in the holding area and taken to the operating room where she was placed supine on the operating room table.  MAC anesthesia was induced.    The L upper chest was prepped and draped.  The prior incision was anesthetized with local anesthetic.  The incision was opened with a #15 blade.  The subcutaneous tissue was divided with the cautery.  The port was identified and the capsule opened.  The four 2-0 prolene sutures were removed.  The port was then removed and pressure held on the tract.  The catheter appeared intact without evidence of breakage, 22.5 cm .  The wound was inspected for hemostasis, which was achieved with cautery.  The wound was closed with 3-0 vicryl deep dermal interrupted sutures and 4-0 Monocryl running subcuticular suture.  The wound was cleaned, dried, and dressed with dermabond.  The patient was awakened from anesthesia and taken to the PACU in stable condition.  Needle, sponge, and instrument counts are correct.

## 2015-01-01 NOTE — Anesthesia Procedure Notes (Signed)
Procedure Name: MAC Date/Time: 01/01/2015 8:28 AM Performed by: Marrianne Mood Pre-anesthesia Checklist: Patient identified, Timeout performed, Emergency Drugs available, Suction available and Patient being monitored Patient Re-evaluated:Patient Re-evaluated prior to inductionOxygen Delivery Method: Simple face mask

## 2015-01-01 NOTE — Discharge Instructions (Addendum)
[facility] °Office Phone Number [phone] ° ° POST OP INSTRUCTIONS ° °Always review your discharge instruction sheet given to you by the facility where your surgery was performed. ° °IF YOU HAVE DISABILITY OR FAMILY LEAVE FORMS, YOU MUST BRING THEM TO THE OFFICE FOR PROCESSING.  DO NOT GIVE THEM TO YOUR DOCTOR. ° °1. A prescription for pain medication may be given to you upon discharge.  Take your pain medication as prescribed, if needed.  If narcotic pain medicine is not needed, then you may take acetaminophen (Tylenol) or ibuprofen (Advil) as needed. °2. Take your usually prescribed medications unless otherwise directed °3. If you need a refill on your pain medication, please contact your pharmacy.  They will contact our office to request authorization.  Prescriptions will not be filled after 5pm or on week-ends. °4. You should eat very light the first 24 hours after surgery, such as soup, crackers, pudding, etc.  Resume your normal diet the day after surgery °5. It is common to experience some constipation if taking pain medication after surgery.  Increasing fluid intake and taking a stool softener will usually help or prevent this problem from occurring.  A mild laxative (Milk of Magnesia or Miralax) should be taken according to package directions if there are no bowel movements after 48 hours. °6. You may shower in 48 hours.  The surgical glue will flake off in 2-3 weeks.   °7. ACTIVITIES:  No strenuous activity or heavy lifting for 1 week.   °a. You may drive when you no longer are taking prescription pain medication, you can comfortably wear a seatbelt, and you can safely maneuver your car and apply brakes. °b. RETURN TO WORK:  __________to be determined._______________ °You should see your doctor in the office for a follow-up appointment approximately three-four weeks after your surgery.   ° °WHEN TO CALL YOUR DOCTOR: °1. Fever over 101.0 °2. Nausea and/or vomiting. °3. Extreme swelling  or bruising. °4. Continued bleeding from incision. °5. Increased pain, redness, or drainage from the incision. ° °The clinic staff is available to answer your questions during regular business hours.  Please don’t hesitate to call and ask to speak to one of the nurses for clinical concerns.  If you have a medical emergency, go to the nearest emergency room or call 911.  A surgeon from [facility] is always on call at the hospital. ° °For further questions, please visit [email or web address]  ° ° ° ° °Post Anesthesia Home Care Instructions ° °Activity: °Get plenty of rest for the remainder of the day. A responsible adult should stay with you for 24 hours following the procedure.  °For the next 24 hours, DO NOT: °-Drive a car °-Operate machinery °-Drink alcoholic beverages °-Take any medication unless instructed by your physician °-Make any legal decisions or sign important papers. ° °Meals: °Start with liquid foods such as gelatin or soup. Progress to regular foods as tolerated. Avoid greasy, spicy, heavy foods. If nausea and/or vomiting occur, drink only clear liquids until the nausea and/or vomiting subsides. Call your physician if vomiting continues. ° °Special Instructions/Symptoms: °Your throat may feel dry or sore from the anesthesia or the breathing tube placed in your throat during surgery. If this causes discomfort, gargle with warm salt water. The discomfort should disappear within 24 hours. ° °If you had a scopolamine patch placed behind your ear for the management of post- operative nausea and/or vomiting: ° °1. The medication in the patch is effective for 72 hours, after   which it should be removed.  Wrap patch in a tissue and discard in the trash. Wash hands thoroughly with soap and water. °2. You may remove the patch earlier than 72 hours if you experience unpleasant side effects which may include dry mouth, dizziness or visual disturbances. °3. Avoid touching the patch. Wash your  hands with soap and water after contact with the patch. °  ° ° °

## 2015-01-01 NOTE — Anesthesia Postprocedure Evaluation (Signed)
Anesthesia Post Note  Patient: April Walker  Procedure(s) Performed: Procedure(s) (LRB): REMOVAL PORT-A-CATH (Left)  Anesthesia type: general  Patient location: PACU  Post pain: Pain level controlled  Post assessment: Patient's Cardiovascular Status Stable  Last Vitals:  Filed Vitals:   01/01/15 1004  BP: 160/72  Pulse: 60  Temp: 36.6 C  Resp: 18    Post vital signs: Reviewed and stable  Level of consciousness: sedated  Complications: No apparent anesthesia complications

## 2015-01-01 NOTE — H&P (Signed)
April Walker is an 64 y.o. female.   Chief Complaint: left breast cancer HPI:  Pt is a 64 yo F with history of triple positive left breast cancer.  She is s/p BCT, chemotherapy, and radiation.  She is ready for port removal.    Past Medical History  Diagnosis Date  . Abnormal cholesterol test   . Hyperlipidemia   . Knee pain, left   . GERD (gastroesophageal reflux disease)   . Arthritis   . Tubulovillous adenoma of colon   . Diverticulosis   . Internal hemorrhoids   . Wears glasses   . Hearing deficit     no hearing aids  . Radiation 05/02/14-06/19/14    Left Breast  . Cancer     breast    Past Surgical History  Procedure Laterality Date  . Tonsillectomy and adenoidectomy    . Right knee arthroscopy  2011 Dr. Aline Brochure  . Treatment fistula anal    . Upper gastrointestinal endoscopy    . Hemorrhoid surgery    . Colonoscopy  2002    hemorrhoids  . Colonoscopy w/ polypectomy  12/09/2010    1 cm sigmoid polyp - , diverticulosis, hemorrhoids  . Knee surgery    . Knee surgery  2012    Left knee arthroscopy  . Total knee arthroplasty  11/23/2011    Procedure: TOTAL KNEE ARTHROPLASTY;  Surgeon: Carole Civil, MD;  Location: [facility];  Service: Orthopedics;  Laterality: Left;  . Portacath placement Left 11/17/2013    Procedure: INSERTION PORT-A-CATH;  Surgeon: Stark Klein, MD;  Location: [place];  Service: General;  Laterality: Left;  . Partial mastectomy with needle localization and axillary sentinel lymph node bx Left 03/21/14    Dr.Reginald Mangels    Family History  Problem Relation Age of Onset  . Adopted: Yes  . Stroke Mother   . Heart attack Father    Social History:  reports that she has never smoked. She has never used smokeless tobacco. She reports that she drinks alcohol. She reports that she does not use illicit drugs.  Allergies:  Allergies  Allergen Reactions  . Sulfonamide Derivatives Anaphylaxis and Hives    Pt. Stated, "large welps on  neck immediately."  . Triprolidine-Pseudoephedrine Other (See Comments)    This is Actifed. Increased heart rate to tachycardia.    Medications Prior to Admission  Medication Sig Dispense Refill  . Acetaminophen (TYLENOL EXTRA STRENGTH PO) Take by mouth.    Marland Kitchen anastrozole (ARIMIDEX) 1 MG tablet TAKE 1 TABLET (1 MG TOTAL) BY MOUTH DAILY. 30 tablet 5  . loratadine (CLARITIN) 10 MG tablet Take 10 mg by mouth daily as needed. For allergies    . omeprazole (PRILOSEC) 20 MG capsule Take 20 mg by mouth as needed.    . simvastatin (ZOCOR) 10 MG tablet Take 10 mg by mouth daily. Pt TAKES AT BEDTIME    . hydrocortisone (ANUSOL-HC) 25 MG suppository Place 1 suppository (25 mg total) rectally at bedtime as needed for hemorrhoids or itching. 10 suppository 0    Results for orders placed or performed during the hospital encounter of 01/01/15 (from the past 48 hour(s))  Hemoglobin-hemacue, POC     Status: None   Collection Time: 01/01/15  7:20 AM  Result Value Ref Range   Hemoglobin 12.6 12.0 - 15.0 g/dL   No results found.  Review of Systems  All other systems reviewed and are negative.   Blood pressure 141/60, pulse 73, temperature 98.4 F (  36.9 C), temperature source Oral, resp. rate 20, height 5\' 8"  (1.727 m), weight 97.07 kg (214 lb), SpO2 98 %. Physical Exam  Constitutional: She is oriented to person, place, and time. She appears well-developed and well-nourished. No distress.  HENT:  Head: Normocephalic and atraumatic.  Eyes: Conjunctivae are normal. Pupils are equal, round, and reactive to light. No scleral icterus.  Neck: Normal range of motion.  Cardiovascular: Normal rate.   Respiratory: Effort normal. No respiratory distress.  Neurological: She is alert and oriented to person, place, and time.  Skin: Skin is warm and dry. She is not diaphoretic.  Psychiatric: She has a normal mood and affect. Her behavior is normal. Judgment and thought content normal.     Assessment/Plan Left  breast cancer Plan port removal.   Discussed risks and benefits.  Pt wishes to proceed.    April Walker 01/01/2015, 7:34 AM

## 2015-01-01 NOTE — Transfer of Care (Signed)
Immediate Anesthesia Transfer of Care Note  Patient: April Walker  Procedure(s) Performed: Procedure(s): REMOVAL PORT-A-CATH (Left)  Patient Location: PACU  Anesthesia Type:MAC  Level of Consciousness: awake and patient cooperative  Airway & Oxygen Therapy: Patient Spontanous Breathing and Patient connected to face mask oxygen  Post-op Assessment: Report given to RN and Post -op Vital signs reviewed and stable  Post vital signs: Reviewed and stable  Last Vitals:  Filed Vitals:   01/01/15 0903  BP: 129/69  Pulse: 137  Temp:   Resp:     Complications: No apparent anesthesia complications

## 2015-01-01 NOTE — Anesthesia Preprocedure Evaluation (Signed)
Anesthesia Evaluation  Patient identified by MRN, date of birth, ID band Patient awake    Reviewed: Allergy & Precautions, NPO status , Patient's Chart, lab work & pertinent test results  Airway Mallampati: II  TM Distance: >3 FB Neck ROM: Full    Dental   Pulmonary neg pulmonary ROS,  breath sounds clear to auscultation        Cardiovascular negative cardio ROS  Rhythm:Regular Rate:Normal     Neuro/Psych    GI/Hepatic Neg liver ROS, GERD-  ,  Endo/Other  negative endocrine ROS  Renal/GU negative Renal ROS     Musculoskeletal   Abdominal   Peds  Hematology   Anesthesia Other Findings   Reproductive/Obstetrics                             Anesthesia Physical Anesthesia Plan  ASA: II  Anesthesia Plan: General   Post-op Pain Management:    Induction: Intravenous  Airway Management Planned: Oral ETT  Additional Equipment:   Intra-op Plan:   Post-operative Plan: Extubation in OR  Informed Consent: I have reviewed the patients History and Physical, chart, labs and discussed the procedure including the risks, benefits and alternatives for the proposed anesthesia with the patient or authorized representative who has indicated his/her understanding and acceptance.   Dental advisory given  Plan Discussed with: CRNA and Anesthesiologist  Anesthesia Plan Comments:         Anesthesia Quick Evaluation

## 2015-01-02 ENCOUNTER — Encounter (HOSPITAL_BASED_OUTPATIENT_CLINIC_OR_DEPARTMENT_OTHER): Payer: Self-pay | Admitting: General Surgery

## 2015-01-02 NOTE — Addendum Note (Signed)
Addendum  created 01/02/15 3762 by Ernesta Amble Redith Drach, CRNA   Modules edited: Charges VN

## 2015-01-10 ENCOUNTER — Encounter: Payer: Self-pay | Admitting: Orthopedic Surgery

## 2015-01-10 ENCOUNTER — Ambulatory Visit (INDEPENDENT_AMBULATORY_CARE_PROVIDER_SITE_OTHER): Payer: Medicare Other

## 2015-01-10 ENCOUNTER — Ambulatory Visit (INDEPENDENT_AMBULATORY_CARE_PROVIDER_SITE_OTHER): Payer: Medicare Other | Admitting: Orthopedic Surgery

## 2015-01-10 VITALS — BP 112/68 | Ht 68.0 in | Wt 214.0 lb

## 2015-01-10 DIAGNOSIS — Z96652 Presence of left artificial knee joint: Secondary | ICD-10-CM

## 2015-01-10 NOTE — Progress Notes (Signed)
Patient ID: April Walker, female   DOB: 05/04/1951, 64 y.o.   MRN: [id number] Patient ID: April Walker, female   DOB: [birth year], 64 y.o.   MRN: [id number]  Post op annual TKA   Chief Complaint  Patient presents with  . Follow-up    yearly follow up + xray left knee, DOS 11/23/11    HPI April Walker is a 64 y.o. female.  Patient reports her left knee is doing well she is having no problems  Her right knee however she complains of pain in the posterior aspect of her hamstring and lateral aspect of her leg she has a history of lumbar disc disease   Past Medical History  Diagnosis Date  . Abnormal cholesterol test   . Hyperlipidemia   . Knee pain, left   . GERD (gastroesophageal reflux disease)   . Arthritis   . Tubulovillous adenoma of colon   . Diverticulosis   . Internal hemorrhoids   . Wears glasses   . Hearing deficit     no hearing aids  . Radiation 05/02/14-06/19/14    Left Breast  . Cancer     breast    Past Surgical History  Procedure Laterality Date  . Tonsillectomy and adenoidectomy    . Right knee arthroscopy  2011 Dr. Aline Brochure  . Treatment fistula anal    . Upper gastrointestinal endoscopy    . Hemorrhoid surgery    . Colonoscopy  2002    hemorrhoids  . Colonoscopy w/ polypectomy  12/09/2010    1 cm sigmoid polyp - , diverticulosis, hemorrhoids  . Knee surgery    . Knee surgery  2012    Left knee arthroscopy  . Total knee arthroplasty  11/23/2011    Procedure: TOTAL KNEE ARTHROPLASTY;  Surgeon: Carole Civil, MD;  Location: [facility];  Service: Orthopedics;  Laterality: Left;  . Portacath placement Left 11/17/2013    Procedure: INSERTION PORT-A-CATH;  Surgeon: Stark Klein, MD;  Location: [place];  Service: General;  Laterality: Left;  . Partial mastectomy with needle localization and axillary sentinel lymph node bx Left 03/21/14    Dr.Faera Byerly  . Port-a-cath removal Left 01/01/2015    Procedure: REMOVAL PORT-A-CATH;  Surgeon:  Stark Klein, MD;  Location: [place];  Service: General;  Laterality: Left;     Allergies  Allergen Reactions  . Sulfonamide Derivatives Anaphylaxis and Hives    Pt. Stated, "large welps on neck immediately."  . Triprolidine-Pseudoephedrine Other (See Comments)    This is Actifed. Increased heart rate to tachycardia.    Current Outpatient Prescriptions  Medication Sig Dispense Refill  . simvastatin (ZOCOR) 10 MG tablet Take 10 mg by mouth daily. Pt TAKES AT BEDTIME     No current facility-administered medications for this visit.    Review of Systems Review of Systems   Physical Exam Blood pressure 112/68, height 5\' 8"  (1.727 m), weight 214 lb (97.07 kg).   Gen. appearance is normal there are no congenital abnormalities   The patient is oriented 3   Mood and affect are normal   Ambulation is without assistive device   Knee inspection reveals a well-healed incision with no swelling   Knee flexion 115 Stability in the anteroposterior plane is normal as well as in the medial lateral plane  Motor exam reveals full extension without extensor lag   Data Reviewed KNEE XRAYS : I interpret her knee x-rays as stable normal total knee film   Assessment  S/P TKA   Plan    Annual x-rays next year       Arther Abbott 01/10/2015, 2:06 PM

## 2015-01-25 DIAGNOSIS — H20012 Primary iridocyclitis, left eye: Secondary | ICD-10-CM | POA: Diagnosis not present

## 2015-02-01 DIAGNOSIS — H20012 Primary iridocyclitis, left eye: Secondary | ICD-10-CM | POA: Diagnosis not present

## 2015-02-22 DIAGNOSIS — H20012 Primary iridocyclitis, left eye: Secondary | ICD-10-CM | POA: Diagnosis not present

## 2015-03-08 ENCOUNTER — Telehealth: Payer: Self-pay | Admitting: Oncology

## 2015-03-08 ENCOUNTER — Encounter: Payer: Self-pay | Admitting: Nurse Practitioner

## 2015-03-08 ENCOUNTER — Ambulatory Visit (HOSPITAL_BASED_OUTPATIENT_CLINIC_OR_DEPARTMENT_OTHER): Payer: Medicare Other | Admitting: Nurse Practitioner

## 2015-03-08 ENCOUNTER — Other Ambulatory Visit (HOSPITAL_BASED_OUTPATIENT_CLINIC_OR_DEPARTMENT_OTHER): Payer: Medicare Other

## 2015-03-08 VITALS — BP 149/70 | HR 78 | Temp 98.0°F | Resp 20 | Wt 223.4 lb

## 2015-03-08 DIAGNOSIS — C50412 Malignant neoplasm of upper-outer quadrant of left female breast: Secondary | ICD-10-CM

## 2015-03-08 LAB — COMPREHENSIVE METABOLIC PANEL (CC13)
ALT: 16 U/L (ref 0–55)
AST: 16 U/L (ref 5–34)
Albumin: 3.7 g/dL (ref 3.5–5.0)
Alkaline Phosphatase: 122 U/L (ref 40–150)
Anion Gap: 6 mEq/L (ref 3–11)
BUN: 9.1 mg/dL (ref 7.0–26.0)
CHLORIDE: 109 meq/L (ref 98–109)
CO2: 28 mEq/L (ref 22–29)
Calcium: 9.7 mg/dL (ref 8.4–10.4)
Creatinine: 0.8 mg/dL (ref 0.6–1.1)
EGFR: 80 mL/min/{1.73_m2} — AB (ref >90)
GLUCOSE: 94 mg/dL (ref 70–140)
POTASSIUM: 4 meq/L (ref 3.5–5.1)
SODIUM: 143 meq/L (ref 136–145)
Total Bilirubin: 0.49 mg/dL (ref 0.20–1.20)
Total Protein: 6.8 g/dL (ref 6.4–8.3)

## 2015-03-08 LAB — CBC WITH DIFFERENTIAL/PLATELET
BASO%: 0.5 % (ref 0.0–2.0)
BASOS ABS: 0 10*3/uL (ref 0.0–0.1)
EOS%: 2.2 % (ref 0.0–7.0)
Eosinophils Absolute: 0.1 10*3/uL (ref 0.0–0.5)
HCT: 39.4 % (ref 34.8–46.6)
HEMOGLOBIN: 12.9 g/dL (ref 11.6–15.9)
LYMPH%: 28 % (ref 14.0–49.7)
MCH: 27.9 pg (ref 25.1–34.0)
MCHC: 32.7 g/dL (ref 31.5–36.0)
MCV: 85.3 fL (ref 79.5–101.0)
MONO#: 0.5 10*3/uL (ref 0.1–0.9)
MONO%: 7.8 % (ref 0.0–14.0)
NEUT#: 3.9 10*3/uL (ref 1.5–6.5)
NEUT%: 61.5 % (ref 38.4–76.8)
Platelets: 211 10*3/uL (ref 145–400)
RBC: 4.62 10*6/uL (ref 3.70–5.45)
RDW: 14.2 % (ref 11.2–14.5)
WBC: 6.3 10*3/uL (ref 3.9–10.3)
lymph#: 1.8 10*3/uL (ref 0.9–3.3)

## 2015-03-08 NOTE — Progress Notes (Signed)
[place]  Telephone:([phone] Fax:(336) [phone]     ID: April Walker OB: 1951/01/04  MR#: [id number]  FIE#:[id number]  PCP: Criselda Peaches, MD GYN:  Vania Rea SU: Stark Klein  OTHER MD: [name], Silvano Rusk, Arther Abbott  CHIEF COMPLAINT: Left Breast Cancer, locally advanced TREATMENT:  anastrozole   BREAST CANCER HISTORY: From the original integument:  April Walker had routine screening mammography at [facility] 09/14/2013. This showed a possible mass in the left breast. On 10/11/2013 she underwent left diagnostic mammography and ultrasonography which confirmed an irregular mass in the upper outer left breast measuring up to 3 cm. This was firm and fixed at the 2:30 o'clock position, 6 cm from the nipple. Ultrasound showed an irregular hypoechoic mass measuring 2.8 cm. The left axilla showed no evidence of adenopathy.  On 10/11/2013 the patient underwent left breast biopsy, with the pathology [facility] [phone]) showing an invasive ductal carcinoma, high-grade, estrogen receptor 100% positive, progesterone receptor 43% positive, both with strong staining intensity, with an MIB-1 of 87% and HER-2 amplified, the signals ratio being 2.72, the number per cell 3.40.  On 10/23/2013 the patient underwent bilateral breast MRI. This showed the breast and position to be density be. In the left breast there was a 3 cm irregular enhancing mass in the upper outer quadrant. There was a 9 mm oval mass in the inner lower left breast with no definite fatty hilum. There was a 1.4 cm upper left axillary lymph node with a thickened cortex. There were no other abnormal appearing lymph nodes. Ultrasound of the left breast 10/30/2013 found of the left axillary lymph node with the mildly asymmetrical cortex, and a 7 mm her mass in the 7:30 o'clock position of the left breast. Biopsy of both these suspicious areas was performed 10/30/2013, and the preliminary report is that  they're both benign.  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 She has been on anastrozole since January of this year and tolerates this well for the most part. She had one majorly depressive episode about 1 week into starting the drug. It scared her so much she was going to call us, but this passed in 24 hrs and has not happened again. She rarely has hot flashes, denies vaginal changes, and her arthritis is no worse than before. The takes extra strength tylenol as needed. She walks regularly for exercise.   REVIEW OF SYSTEMS: A detailed review of systems is otherwise entirely negative, except where noted above.   PAST MEDICAL HISTORY: Past Medical History  Diagnosis Date  . Abnormal cholesterol test   . Hyperlipidemia   . Knee pain, left   . GERD (gastroesophageal reflux disease)   . Arthritis   . Tubulovillous adenoma of colon   . Diverticulosis   . Internal hemorrhoids   . Wears glasses   . Hearing deficit     no hearing aids  . Radiation 05/02/14-06/19/14    Left Breast  . Cancer     breast    PAST SURGICAL HISTORY: Past Surgical History  Procedure Laterality Date  . Tonsillectomy and adenoidectomy    . Right knee arthroscopy  2011 Dr. Aline Brochure  . Treatment fistula anal    . Upper gastrointestinal endoscopy    . Hemorrhoid surgery    . Colonoscopy  2002    hemorrhoids  . Colonoscopy w/ polypectomy  12/09/2010  1 cm sigmoid polyp - , diverticulosis, hemorrhoids  . Knee surgery    . Knee surgery  2012    Left knee arthroscopy  . Total knee arthroplasty  11/23/2011    Procedure: TOTAL KNEE ARTHROPLASTY;  Surgeon: Carole Civil, MD;  Location: [facility];  Service: Orthopedics;  Laterality: Left;  . Portacath placement Left 11/17/2013    Procedure: INSERTION PORT-A-CATH;  Surgeon: Stark Klein, MD;  Location: [place];  Service: General;   Laterality: Left;  . Partial mastectomy with needle localization and axillary sentinel lymph node bx Left 03/21/14    Dr.Faera Byerly  . Port-a-cath removal Left 01/01/2015    Procedure: REMOVAL PORT-A-CATH;  Surgeon: Stark Klein, MD;  Location: [place];  Service: General;  Laterality: Left;    FAMILY HISTORY Family History  Problem Relation Age of Onset  . Adopted: Yes  . Stroke Mother   . Heart attack Father    the patient is adopted and has no information on her biological family. the patient's adoptive father died at the age of 19 from a myocardial infarction; productive mother died from a stroke at age 21. The patient does not know of any siblings  GYNECOLOGIC HISTORY: (Reviewed 12/18/2013) Menarche age 56. The patient is GX P0. She underwent menopause in her early 64s and took hormone replacement until 2010.  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 She is single, and lives at home with her black lab, [name], and her cats Patches and [name]    ADVANCED DIRECTIVES: Not in place   HEALTH MAINTENANCE:  (Reviewed 12/18/2013) Social History  Substance Use Topics  . Smoking status: Never Smoker   . Smokeless tobacco: Never Used  . Alcohol Use: Yes     Comment: socially     Colonoscopy: 2012/[name]  PAP: 2014/[name]  Bone density: not on file  Lipid panel: not on file   Allergies  Allergen Reactions  . Sulfonamide Derivatives Anaphylaxis and Hives    Pt. Stated, "large welps on neck immediately."  . Triprolidine-Pseudoephedrine Other (See Comments)    This is Actifed. Increased heart rate to tachycardia.    Current Outpatient Prescriptions  Medication Sig Dispense Refill  . acetaminophen (TYLENOL) 500 MG tablet Take 500 mg by mouth as needed.    Marland Kitchen anastrozole (ARIMIDEX) 1 MG tablet TAKE 1 TABLET (1 MG TOTAL) BY MOUTH DAILY.  5  . loratadine (CLARITIN) 10 MG tablet  Take 10 mg by mouth daily.    Marland Kitchen omeprazole (PRILOSEC) 20 MG capsule Take 20 mg by mouth as needed.    . simvastatin (ZOCOR) 10 MG tablet Take 10 mg by mouth daily. Pt TAKES AT BEDTIME     No current facility-administered medications for this visit.    OBJECTIVE: Middle-aged white woman Who appears stated age 33 Vitals:   03/08/15 1104  BP: 149/70  Pulse: 78  Temp: 98 F (36.7 C)  Resp: 20     Body mass index is 33.98 kg/(m^2).    ECOG FS:1 - Symptomatic but completely ambulatory Filed Weights   03/08/15 1104  Weight: 223 lb 6.4 oz (101.334 kg)   Skin: warm, dry  HEENT: sclerae anicteric, conjunctivae pink, oropharynx clear. No thrush or mucositis.  Lymph Nodes: No cervical or supraclavicular lymphadenopathy  Lungs: clear to auscultation bilaterally, no rales, wheezes, or rhonci  Heart: regular rate and rhythm  Abdomen: round, soft, non  tender, positive bowel sounds  Musculoskeletal: No focal spinal tenderness, no peripheral edema  Neuro: non focal, well oriented, positive affect  Breasts: left breast status post lumpectomy and radiation. No evidence of recurrent disease. Left axilla benign. Right breast unremarkable.  LAB RESULTS:   Lab Results  Component Value Date   WBC 6.3 03/08/2015   NEUTROABS 3.9 03/08/2015   HGB 12.9 03/08/2015   HCT 39.4 03/08/2015   MCV 85.3 03/08/2015   PLT 211 03/08/2015      Chemistry      Component Value Date/Time   NA 143 03/08/2015 1051   NA 143 03/21/2014 0801   K 4.0 03/08/2015 1051   K 3.2* 03/21/2014 0801   CL 108 03/21/2014 0801   CO2 28 03/08/2015 1051   CO2 25 02/12/2014 1003   BUN 9.1 03/08/2015 1051   BUN 8 03/21/2014 0801   CREATININE 0.8 03/08/2015 1051   CREATININE 0.60 03/21/2014 0801      Component Value Date/Time   CALCIUM 9.7 03/08/2015 1051   CALCIUM 10.0 02/12/2014 1003   ALKPHOS 122 03/08/2015 1051   ALKPHOS 92 02/12/2014 1003   AST 16 03/08/2015 1051   AST 16 02/12/2014 1003   ALT 16 03/08/2015  1051   ALT 16 02/12/2014 1003   BILITOT 0.49 03/08/2015 1051   BILITOT 0.4 02/12/2014 1003       STUDIES: No results found.   ASSESSMENT: 64 y.o. [place] woman status post left breast biopsy 10/11/2013 for a clinical T2 N0, stage IIA invasive ductal carcinoma, grade 3, estrogen and progesterone receptor positive, with an MIB-1 of 87%, and HER-2 amplified  (a) additional Left breast and left axillary node biopsy 10/30/2013 benign  (1) started neoadjuvant therapy 11/20/2013 , consisting of carboplatin, docetaxel, trastuzumab and pertuzumab every 3 weeks x6 completed 03/05/2014   (2) trastuzumab alone to continue to complete a year; most recent echocardiogram 01/24/2014 shows a well preserved ejection fraction.  (3) Patient underwent left lumpectomy with sentinel node biopsy on 03/21/14. Pathology results demonstrated a complete response, pT0 pN0  (4) radiation to follow surgery   (5) anastrozole started 07/06/14   (a) DEXA scan 07/03/2014 was normal, with the lowest T score -1.0  PLAN: Keni looks and feels well today. The labs were reviewed in detail and were entirely normal. She is tolerating the anastrozole well and is expected to complete 5 years of therapy on this drug.   Samiksha will return in 6 months for labs and a follow up visit. She will have a repeat mammogram that month. She understands and agrees with this plan. She knows the goal of treatment in her case is cure. She has been encouraged to call with any issues that might arise before her next visit here.   Laurie Panda, NP  03/08/2015 11:55 AM

## 2015-03-08 NOTE — Telephone Encounter (Signed)
Gave avs & calendar for March. °

## 2015-03-18 DIAGNOSIS — H20012 Primary iridocyclitis, left eye: Secondary | ICD-10-CM | POA: Diagnosis not present

## 2015-03-27 ENCOUNTER — Other Ambulatory Visit: Payer: Self-pay | Admitting: Nurse Practitioner

## 2015-03-27 DIAGNOSIS — C50919 Malignant neoplasm of unspecified site of unspecified female breast: Secondary | ICD-10-CM

## 2015-04-22 DIAGNOSIS — R799 Abnormal finding of blood chemistry, unspecified: Secondary | ICD-10-CM | POA: Diagnosis not present

## 2015-04-22 DIAGNOSIS — D559 Anemia due to enzyme disorder, unspecified: Secondary | ICD-10-CM | POA: Diagnosis not present

## 2015-04-22 DIAGNOSIS — E039 Hypothyroidism, unspecified: Secondary | ICD-10-CM | POA: Diagnosis not present

## 2015-04-22 DIAGNOSIS — I10 Essential (primary) hypertension: Secondary | ICD-10-CM | POA: Diagnosis not present

## 2015-05-21 DIAGNOSIS — H20012 Primary iridocyclitis, left eye: Secondary | ICD-10-CM | POA: Diagnosis not present

## 2015-05-23 DIAGNOSIS — I10 Essential (primary) hypertension: Secondary | ICD-10-CM | POA: Diagnosis not present

## 2015-06-17 DIAGNOSIS — C50412 Malignant neoplasm of upper-outer quadrant of left female breast: Secondary | ICD-10-CM | POA: Diagnosis not present

## 2015-07-04 DIAGNOSIS — I10 Essential (primary) hypertension: Secondary | ICD-10-CM | POA: Diagnosis not present

## 2015-08-21 DIAGNOSIS — I10 Essential (primary) hypertension: Secondary | ICD-10-CM | POA: Diagnosis not present

## 2015-08-21 DIAGNOSIS — C50919 Malignant neoplasm of unspecified site of unspecified female breast: Secondary | ICD-10-CM | POA: Diagnosis not present

## 2015-08-27 ENCOUNTER — Other Ambulatory Visit: Payer: Self-pay | Admitting: Oncology

## 2015-08-27 DIAGNOSIS — Z853 Personal history of malignant neoplasm of breast: Secondary | ICD-10-CM

## 2015-09-04 ENCOUNTER — Other Ambulatory Visit: Payer: Self-pay | Admitting: *Deleted

## 2015-09-04 DIAGNOSIS — C50412 Malignant neoplasm of upper-outer quadrant of left female breast: Secondary | ICD-10-CM

## 2015-09-05 ENCOUNTER — Other Ambulatory Visit (HOSPITAL_BASED_OUTPATIENT_CLINIC_OR_DEPARTMENT_OTHER): Payer: Medicare Other

## 2015-09-05 ENCOUNTER — Ambulatory Visit (HOSPITAL_BASED_OUTPATIENT_CLINIC_OR_DEPARTMENT_OTHER): Payer: Medicare Other | Admitting: Nurse Practitioner

## 2015-09-05 ENCOUNTER — Telehealth: Payer: Self-pay | Admitting: Oncology

## 2015-09-05 ENCOUNTER — Encounter: Payer: Self-pay | Admitting: Nurse Practitioner

## 2015-09-05 ENCOUNTER — Other Ambulatory Visit: Payer: Self-pay | Admitting: *Deleted

## 2015-09-05 ENCOUNTER — Other Ambulatory Visit: Payer: Medicare Other

## 2015-09-05 ENCOUNTER — Ambulatory Visit: Payer: Medicare Other | Admitting: Oncology

## 2015-09-05 VITALS — BP 109/57 | HR 88 | Temp 97.7°F | Resp 18 | Ht 68.0 in | Wt 229.5 lb

## 2015-09-05 DIAGNOSIS — C50412 Malignant neoplasm of upper-outer quadrant of left female breast: Secondary | ICD-10-CM | POA: Diagnosis not present

## 2015-09-05 DIAGNOSIS — Z170 Estrogen receptor positive status [ER+]: Secondary | ICD-10-CM

## 2015-09-05 LAB — COMPREHENSIVE METABOLIC PANEL
ALT: 17 U/L (ref 0–55)
ANION GAP: 11 meq/L (ref 3–11)
AST: 18 U/L (ref 5–34)
Albumin: 3.8 g/dL (ref 3.5–5.0)
Alkaline Phosphatase: 117 U/L (ref 40–150)
BUN: 14.8 mg/dL (ref 7.0–26.0)
CHLORIDE: 107 meq/L (ref 98–109)
CO2: 26 meq/L (ref 22–29)
Calcium: 9.4 mg/dL (ref 8.4–10.4)
Creatinine: 0.9 mg/dL (ref 0.6–1.1)
EGFR: 66 mL/min/{1.73_m2} — AB (ref >90)
Glucose: 113 mg/dl (ref 70–140)
POTASSIUM: 3.3 meq/L — AB (ref 3.5–5.1)
Sodium: 144 mEq/L (ref 136–145)
Total Bilirubin: 0.57 mg/dL (ref 0.20–1.20)
Total Protein: 7.2 g/dL (ref 6.4–8.3)

## 2015-09-05 LAB — CBC WITH DIFFERENTIAL/PLATELET
BASO%: 0.5 % (ref 0.0–2.0)
BASOS ABS: 0 10*3/uL (ref 0.0–0.1)
EOS%: 2.4 % (ref 0.0–7.0)
Eosinophils Absolute: 0.2 10*3/uL (ref 0.0–0.5)
HEMATOCRIT: 36.1 % (ref 34.8–46.6)
HEMOGLOBIN: 11.8 g/dL (ref 11.6–15.9)
LYMPH#: 1.9 10*3/uL (ref 0.9–3.3)
LYMPH%: 23.4 % (ref 14.0–49.7)
MCH: 27.4 pg (ref 25.1–34.0)
MCHC: 32.7 g/dL (ref 31.5–36.0)
MCV: 84 fL (ref 79.5–101.0)
MONO#: 0.5 10*3/uL (ref 0.1–0.9)
MONO%: 6.4 % (ref 0.0–14.0)
NEUT#: 5.4 10*3/uL (ref 1.5–6.5)
NEUT%: 67.3 % (ref 38.4–76.8)
PLATELETS: 260 10*3/uL (ref 145–400)
RBC: 4.3 10*6/uL (ref 3.70–5.45)
RDW: 15.1 % — AB (ref 11.2–14.5)
WBC: 8 10*3/uL (ref 3.9–10.3)

## 2015-09-05 MED ORDER — ANASTROZOLE 1 MG PO TABS: 1.0000 mg | ORAL_TABLET | Freq: Every day | ORAL | Status: DC

## 2015-09-05 NOTE — Telephone Encounter (Signed)
Gave patient avs report and appointments for August.  °

## 2015-09-05 NOTE — Progress Notes (Signed)
[place]  Telephone:(336) [phone] Fax:(336) [phone]     ID: April Walker OB: 04/13/1951  MR#: [id number]  SNK#:[id number]  PCP: Criselda Peaches, MD GYN:  Vania Rea SU: Stark Klein  OTHER MD: [name], Silvano Rusk, Arther Abbott  CHIEF COMPLAINT: Left Breast Cancer, locally advanced  CURRENT TREATMENT:  anastrozole   BREAST CANCER HISTORY: From the original integument:  [name] had routine screening mammography at [facility] 09/14/2013. This showed a possible mass in the left breast. On 10/11/2013 she underwent left diagnostic mammography and ultrasonography which confirmed an irregular mass in the upper outer left breast measuring up to 3 cm. This was firm and fixed at the 2:30 o'clock position, 6 cm from the nipple. Ultrasound showed an irregular hypoechoic mass measuring 2.8 cm. The left axilla showed no evidence of adenopathy.  On 10/11/2013 the patient underwent left breast biopsy, with the pathology [facility] [phone]) showing an invasive ductal carcinoma, high-grade, estrogen receptor 100% positive, progesterone receptor 43% positive, both with strong staining intensity, with an MIB-1 of 87% and HER-2 amplified, the signals ratio being 2.72, the number per cell 3.40.  On 10/23/2013 the patient underwent bilateral breast MRI. This showed the breast and position to be density be. In the left breast there was a 3 cm irregular enhancing mass in the upper outer quadrant. There was a 9 mm oval mass in the inner lower left breast with no definite fatty hilum. There was a 1.4 cm upper left axillary lymph node with a thickened cortex. There were no other abnormal appearing lymph nodes. Ultrasound of the left breast 10/30/2013 found of the left axillary lymph node with the mildly asymmetrical cortex, and a 7 mm her mass in the 7:30 o'clock position of the left breast. Biopsy of both these suspicious areas was performed 10/30/2013, and the preliminary report is  that they're both benign.  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 She continues on anastrozole and tolerates this well with a few complaints. She has some night sweats, but she had these at baseline. She denies vaginal changes. The arthritic pain she had at baseline is no worse since staring on this drug. The interval history is generally unremarkable.  REVIEW OF SYSTEMS: April Walker is having foot pain. Especially after walking long distances. She wonders if she has plantar fasciitis. She continues to exercise regularly, and has recently taken up water aerobics. She has some dizziness if she sits up too quickly. A detailed review of systems is otherwise entirely negative.   PAST MEDICAL HISTORY: Past Medical History  Diagnosis Date  . Abnormal cholesterol test   . Hyperlipidemia   . Knee pain, left   . GERD (gastroesophageal reflux disease)   . Arthritis   . Tubulovillous adenoma of colon   . Diverticulosis   . Internal hemorrhoids   . Wears glasses   . Hearing deficit     no hearing aids  . Radiation 05/02/14-06/19/14    Left Breast  . Cancer     breast    PAST SURGICAL HISTORY: Past Surgical History  Procedure Laterality Date  . Tonsillectomy and adenoidectomy    . Right knee arthroscopy  2011 Dr. Aline Brochure  . Treatment fistula anal    . Upper gastrointestinal endoscopy    . Hemorrhoid surgery    . Colonoscopy  2002    hemorrhoids  . Colonoscopy w/ polypectomy  12/09/2010  1 cm sigmoid polyp - , diverticulosis, hemorrhoids  . Knee surgery    . Knee surgery  2012    Left knee arthroscopy  . Total knee arthroplasty  11/23/2011    Procedure: TOTAL KNEE ARTHROPLASTY;  Surgeon: Carole Civil, MD;  Location: [facility];  Service: Orthopedics;  Laterality: Left;  . Portacath placement Left 11/17/2013    Procedure: INSERTION PORT-A-CATH;  Surgeon: Stark Klein, MD;  Location:  [place];  Service: General;  Laterality: Left;  . Partial mastectomy with needle localization and axillary sentinel lymph node bx Left 03/21/14    Dr.Faera Byerly  . Port-a-cath removal Left 01/01/2015    Procedure: REMOVAL PORT-A-CATH;  Surgeon: Stark Klein, MD;  Location: [place];  Service: General;  Laterality: Left;    FAMILY HISTORY Family History  Problem Relation Age of Onset  . Adopted: Yes  . Stroke Mother   . Heart attack Father    the patient is adopted and has no information on her biological family. the patient's adoptive father died at the age of 21 from a myocardial infarction; productive mother died from a stroke at age 40. The patient does not know of any siblings  GYNECOLOGIC HISTORY: (Reviewed 12/18/2013) Menarche age 9. The patient is GX P0. She underwent menopause in her early 75s and took hormone replacement until 2010.  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 She is single, and lives at home with her black lab, [name], and her cats Patches and [name]    ADVANCED DIRECTIVES: Not in place   HEALTH MAINTENANCE:  (Reviewed 12/18/2013) Social History  Substance Use Topics  . Smoking status: Never Smoker   . Smokeless tobacco: Never Used  . Alcohol Use: Yes     Comment: socially     Colonoscopy: 2012/[name]  PAP: 2014/[name]  Bone density: not on file  Lipid panel: not on file   Allergies  Allergen Reactions  . Sulfonamide Derivatives Anaphylaxis and Hives    Pt. Stated, "large welps on neck immediately."  . Triprolidine-Pseudoephedrine Other (See Comments)    This is Actifed. Increased heart rate to tachycardia.    Current Outpatient Prescriptions  Medication Sig Dispense Refill  . acetaminophen (TYLENOL) 500 MG tablet Take 500 mg by mouth as needed.    Marland Kitchen anastrozole (ARIMIDEX) 1 MG tablet TAKE 1 TABLET (1 MG TOTAL) BY MOUTH  DAILY.  5  . loratadine (CLARITIN) 10 MG tablet Take 10 mg by mouth daily.    Marland Kitchen losartan-hydrochlorothiazide (HYZAAR) 100-25 MG tablet Take 1 tablet by mouth daily.  12  . omeprazole (PRILOSEC) 20 MG capsule Take 20 mg by mouth as needed.    . simvastatin (ZOCOR) 10 MG tablet Take 10 mg by mouth daily. Pt TAKES AT BEDTIME     No current facility-administered medications for this visit.    OBJECTIVE: Middle-aged white woman Who appears stated age 65 Vitals:   09/05/15 1342  BP: 109/57  Pulse: 88  Temp: 97.7 F (36.5 C)  Resp: 18     Body mass index is 34.9 kg/(m^2).    ECOG FS:1 - Symptomatic but completely ambulatory Filed Weights   09/05/15 1342  Weight: 229 lb 8 oz (104.101 kg)   Skin: warm, dry  HEENT: sclerae anicteric, conjunctivae pink, oropharynx clear. No thrush or mucositis.  Lymph Nodes: No cervical or supraclavicular lymphadenopathy  Lungs: clear to auscultation bilaterally, no  rales, wheezes, or rhonci  Heart: regular rate and rhythm  Abdomen: round, soft, non tender, positive bowel sounds  Musculoskeletal: No focal spinal tenderness, no peripheral edema  Neuro: non focal, well oriented, positive affect  Breasts: left breast status post lumpectomy and radiation. No evidence of recurrent disease. Left axilla benign. Right breast unremarkable.  LAB RESULTS:   Lab Results  Component Value Date   WBC 8.0 09/05/2015   NEUTROABS 5.4 09/05/2015   HGB 11.8 09/05/2015   HCT 36.1 09/05/2015   MCV 84.0 09/05/2015   PLT 260 09/05/2015      Chemistry      Component Value Date/Time   NA 144 09/05/2015 1333   NA 143 03/21/2014 0801   K 3.3* 09/05/2015 1333   K 3.2* 03/21/2014 0801   CL 108 03/21/2014 0801   CO2 26 09/05/2015 1333   CO2 25 02/12/2014 1003   BUN 14.8 09/05/2015 1333   BUN 8 03/21/2014 0801   CREATININE 0.9 09/05/2015 1333   CREATININE 0.60 03/21/2014 0801      Component Value Date/Time   CALCIUM 9.4 09/05/2015 1333   CALCIUM 10.0 02/12/2014  1003   ALKPHOS 117 09/05/2015 1333   ALKPHOS 92 02/12/2014 1003   AST 18 09/05/2015 1333   AST 16 02/12/2014 1003   ALT 17 09/05/2015 1333   ALT 16 02/12/2014 1003   BILITOT 0.57 09/05/2015 1333   BILITOT 0.4 02/12/2014 1003       STUDIES: No results found.   ASSESSMENT: 65 y.o. [place] woman status post left breast biopsy 10/11/2013 for a clinical T2 N0, stage IIA invasive ductal carcinoma, grade 3, estrogen and progesterone receptor positive, with an MIB-1 of 87%, and HER-2 amplified  (a) additional Left breast and left axillary node biopsy 10/30/2013 benign  (1) started neoadjuvant therapy 11/20/2013 , consisting of carboplatin, docetaxel, trastuzumab and pertuzumab every 3 weeks x6 completed 03/05/2014   (2) trastuzumab alone to continue to complete a year; most recent echocardiogram 01/24/2014 shows a well preserved ejection fraction.  (3) Patient underwent left lumpectomy with sentinel node biopsy on 03/21/14. Pathology results demonstrated a complete response, pT0 pN0  (4) radiation to follow surgery   (5) anastrozole started 07/06/14   (a) DEXA scan 07/03/2014 was normal, with the lowest T score -1.0  PLAN: Acasia is doing well as far as her breast cancer is concerned. She is now almost 2 years out from her definitive surgery with no evidence of recurrent disease. The labs were reviewed in detail and were stable. She is tolerating the anastrozole well and will continue this drug for at least 5 years of antiestrogen therapy.   She is scheduled for a repeat mammogram later this month.   Wana will return for one more 6 month visit with Dr. Jana Hakim in late summer. She understands and agrees with this plan. She knows the goal of treatment in her case is cure. She has been encouraged to call with any issues that might arise before her next visit here.   Laurie Panda, NP  09/05/2015 2:17 PM

## 2015-09-26 ENCOUNTER — Ambulatory Visit
Admission: RE | Admit: 2015-09-26 | Discharge: 2015-09-26 | Disposition: A | Discharge status: Home / Self Care | Payer: Medicare Other | Source: Ambulatory Visit | Attending: Oncology | Admitting: Oncology

## 2015-09-26 DIAGNOSIS — Z853 Personal history of malignant neoplasm of breast: Secondary | ICD-10-CM

## 2015-09-26 DIAGNOSIS — R928 Other abnormal and inconclusive findings on diagnostic imaging of breast: Secondary | ICD-10-CM | POA: Diagnosis not present

## 2015-10-22 DIAGNOSIS — I119 Hypertensive heart disease without heart failure: Secondary | ICD-10-CM | POA: Diagnosis not present

## 2015-10-22 DIAGNOSIS — Z0000 Encounter for general adult medical examination without abnormal findings: Secondary | ICD-10-CM | POA: Diagnosis not present

## 2015-10-22 DIAGNOSIS — R6889 Other general symptoms and signs: Secondary | ICD-10-CM | POA: Diagnosis not present

## 2015-10-22 DIAGNOSIS — D0590 Unspecified type of carcinoma in situ of unspecified breast: Secondary | ICD-10-CM | POA: Diagnosis not present

## 2015-10-22 DIAGNOSIS — E7800 Pure hypercholesterolemia, unspecified: Secondary | ICD-10-CM | POA: Diagnosis not present

## 2015-10-22 DIAGNOSIS — Z130 Encounter for screening for diseases of the blood and blood-forming organs and certain disorders involving the immune mechanism: Secondary | ICD-10-CM | POA: Diagnosis not present

## 2015-11-19 ENCOUNTER — Encounter: Payer: Self-pay | Admitting: Adult Health

## 2015-11-19 NOTE — Progress Notes (Signed)
A birthday card was mailed to the patient today on behalf of the Survivorship Program at [facility].   Tambra Muller, NP Survivorship Program [facility] [phone]  

## 2015-12-30 DIAGNOSIS — C50412 Malignant neoplasm of upper-outer quadrant of left female breast: Secondary | ICD-10-CM | POA: Diagnosis not present

## 2016-01-09 ENCOUNTER — Ambulatory Visit (INDEPENDENT_AMBULATORY_CARE_PROVIDER_SITE_OTHER): Payer: Medicare Other | Admitting: Orthopedic Surgery

## 2016-01-09 ENCOUNTER — Encounter: Payer: Self-pay | Admitting: Orthopedic Surgery

## 2016-01-09 ENCOUNTER — Ambulatory Visit (INDEPENDENT_AMBULATORY_CARE_PROVIDER_SITE_OTHER): Payer: Medicare Other

## 2016-01-09 VITALS — BP 125/73 | HR 80 | Ht 68.0 in | Wt 231.6 lb

## 2016-01-09 DIAGNOSIS — Z96652 Presence of left artificial knee joint: Secondary | ICD-10-CM

## 2016-01-09 NOTE — Patient Instructions (Signed)
April Walker has decided to NOT take prophylactic antibiotics for dental procedures And I approve

## 2016-01-09 NOTE — Progress Notes (Signed)
Patient ID: April Walker, female   DOB: 02-22-1951, 65 y.o.   MRN: [id number]  Post op annual TKA   Chief Complaint  Patient presents with  . Follow-up    Yearly follow up left TKA DOS 11/23/11    HPI April Walker is a 65 y.o. female.  Left knee Replacement. The knee is functioning well she has no problems   System review negative for mechanical problems related to the knee  Past Medical History  Diagnosis Date  . Abnormal cholesterol test   . Hyperlipidemia   . Knee pain, left   . GERD (gastroesophageal reflux disease)   . Arthritis   . Tubulovillous adenoma of colon   . Diverticulosis   . Internal hemorrhoids   . Wears glasses   . Hearing deficit     no hearing aids  . Radiation 05/02/14-06/19/14    Left Breast  . Cancer [name]     breast    Past Surgical History  Procedure Laterality Date  . Tonsillectomy and adenoidectomy    . Right knee arthroscopy  2011 Dr. Aline Brochure  . Treatment fistula anal    . Upper gastrointestinal endoscopy    . Hemorrhoid surgery    . Colonoscopy  2002    hemorrhoids  . Colonoscopy w/ polypectomy  12/09/2010    1 cm sigmoid polyp - , diverticulosis, hemorrhoids  . Knee surgery    . Knee surgery  2012    Left knee arthroscopy  . Total knee arthroplasty  11/23/2011    Procedure: TOTAL KNEE ARTHROPLASTY;  Surgeon: Carole Civil, MD;  Location: [facility];  Service: Orthopedics;  Laterality: Left;  . Portacath placement Left 11/17/2013    Procedure: INSERTION PORT-A-CATH;  Surgeon: Stark Klein, MD;  Location: [place];  Service: General;  Laterality: Left;  . Partial mastectomy with needle localization and axillary sentinel lymph node bx Left 03/21/14    Dr.Faera Byerly  . Port-a-cath removal Left 01/01/2015    Procedure: REMOVAL PORT-A-CATH;  Surgeon: Stark Klein, MD;  Location: [place];  Service: General;  Laterality: Left;     Allergies  Allergen Reactions  . Sulfonamide Derivatives Anaphylaxis  and Hives    Pt. Stated, "large welps on neck immediately."  . Triprolidine-Pseudoephedrine Other (See Comments)    This is Actifed. Increased heart rate to tachycardia.    Current Outpatient Prescriptions  Medication Sig Dispense Refill  . acetaminophen (TYLENOL) 500 MG tablet Take 500 mg by mouth as needed.    Marland Kitchen anastrozole (ARIMIDEX) 1 MG tablet Take 1 tablet (1 mg total) by mouth daily. 90 tablet 4  . loratadine (CLARITIN) 10 MG tablet Take 10 mg by mouth daily.    Marland Kitchen losartan-hydrochlorothiazide (HYZAAR) 100-25 MG tablet Take 1 tablet by mouth daily.  12  . omeprazole (PRILOSEC) 20 MG capsule Take 20 mg by mouth as needed.    . simvastatin (ZOCOR) 10 MG tablet Take 10 mg by mouth daily. Pt TAKES AT BEDTIME     No current facility-administered medications for this visit.    Review of Systems Review of Systems   Physical Exam Blood pressure 125/73, pulse 80, height 5\' 8"  (1.727 m), weight 231 lb 9.6 oz (105.053 kg).   Gen. appearance is normal there are no congenital abnormalities   The patient is oriented 3   Mood and affect are normal   Ambulation is without assistive device   Knee inspection reveals a well-healed incision with no swelling  Knee flexion 120    Stability in the anteroposterior plane is normal as well as in the medial lateral plane  Motor exam reveals full extension without extensor lag   Data Reviewed KNEE XRAYS : Stable x-ray no loosening Assessment S/P left TKA   Plan    Annual x-ray exterior  I discussed with her the recommendations regarding prophylactic antibiotics were total knees. She does not have any risk factors such as rheumatoid arthritis or diabetes her ongoing chemotherapy she has no immunocompromised issues so she is decided with informed consent to not take antibiotics       Arther Abbott 01/09/2016, 3:14 PM

## 2016-01-28 ENCOUNTER — Encounter: Payer: Self-pay | Admitting: Internal Medicine

## 2016-01-29 ENCOUNTER — Encounter: Payer: Self-pay | Admitting: Internal Medicine

## 2016-03-04 ENCOUNTER — Other Ambulatory Visit: Payer: Self-pay

## 2016-03-04 DIAGNOSIS — C50412 Malignant neoplasm of upper-outer quadrant of left female breast: Secondary | ICD-10-CM

## 2016-03-05 ENCOUNTER — Telehealth: Payer: Self-pay | Admitting: Oncology

## 2016-03-05 ENCOUNTER — Ambulatory Visit (HOSPITAL_BASED_OUTPATIENT_CLINIC_OR_DEPARTMENT_OTHER): Payer: Medicare Other | Admitting: Oncology

## 2016-03-05 ENCOUNTER — Other Ambulatory Visit (HOSPITAL_BASED_OUTPATIENT_CLINIC_OR_DEPARTMENT_OTHER): Payer: Medicare Other

## 2016-03-05 VITALS — BP 129/77 | HR 78 | Temp 98.5°F | Resp 19 | Ht 68.0 in | Wt 233.2 lb

## 2016-03-05 DIAGNOSIS — C50412 Malignant neoplasm of upper-outer quadrant of left female breast: Secondary | ICD-10-CM

## 2016-03-05 DIAGNOSIS — Z79811 Long term (current) use of aromatase inhibitors: Secondary | ICD-10-CM

## 2016-03-05 LAB — CBC WITH DIFFERENTIAL/PLATELET
BASO%: 1 % (ref 0.0–2.0)
BASOS ABS: 0.1 10*3/uL (ref 0.0–0.1)
EOS%: 2.7 % (ref 0.0–7.0)
Eosinophils Absolute: 0.2 10*3/uL (ref 0.0–0.5)
HCT: 38.1 % (ref 34.8–46.6)
HGB: 12.4 g/dL (ref 11.6–15.9)
LYMPH%: 26.4 % (ref 14.0–49.7)
MCH: 26.5 pg (ref 25.1–34.0)
MCHC: 32.4 g/dL (ref 31.5–36.0)
MCV: 81.7 fL (ref 79.5–101.0)
MONO#: 0.5 10*3/uL (ref 0.1–0.9)
MONO%: 6.5 % (ref 0.0–14.0)
NEUT#: 4.9 10*3/uL (ref 1.5–6.5)
NEUT%: 63.4 % (ref 38.4–76.8)
Platelets: 257 10*3/uL (ref 145–400)
RBC: 4.67 10*6/uL (ref 3.70–5.45)
RDW: 15.2 % — ABNORMAL HIGH (ref 11.2–14.5)
WBC: 7.7 10*3/uL (ref 3.9–10.3)
lymph#: 2 10*3/uL (ref 0.9–3.3)

## 2016-03-05 LAB — COMPREHENSIVE METABOLIC PANEL
ALT: 15 U/L (ref 0–55)
AST: 17 U/L (ref 5–34)
Albumin: 3.7 g/dL (ref 3.5–5.0)
Alkaline Phosphatase: 112 U/L (ref 40–150)
Anion Gap: 9 mEq/L (ref 3–11)
BUN: 14 mg/dL (ref 7.0–26.0)
CHLORIDE: 103 meq/L (ref 98–109)
CO2: 29 mEq/L (ref 22–29)
Calcium: 9.6 mg/dL (ref 8.4–10.4)
Creatinine: 0.8 mg/dL (ref 0.6–1.1)
EGFR: 72 mL/min/{1.73_m2} — ABNORMAL LOW (ref >90)
GLUCOSE: 134 mg/dL (ref 70–140)
POTASSIUM: 3.6 meq/L (ref 3.5–5.1)
SODIUM: 141 meq/L (ref 136–145)
Total Bilirubin: 0.61 mg/dL (ref 0.20–1.20)
Total Protein: 7.2 g/dL (ref 6.4–8.3)

## 2016-03-05 MED ORDER — GABAPENTIN 300 MG PO CAPS: 300.0000 mg | ORAL_CAPSULE | Freq: Every day | ORAL | 4 refills | Status: DC

## 2016-03-05 MED ORDER — ANASTROZOLE 1 MG PO TABS: 1.0000 mg | ORAL_TABLET | Freq: Every day | ORAL | 4 refills | Status: DC

## 2016-03-05 NOTE — Progress Notes (Signed)
[place]  [phone] [phone]     ID: HEIRESS WILLIAMSON OB: 67-Jun-1952  MR#: [id number]  [facility]#:[id number]  PCP: Criselda Peaches, MD GYN:  Vania Rea SU: Stark Klein  OTHER MD: [name], Silvano Rusk, Arther Abbott  CHIEF COMPLAINT: Left Breast Cancer, locally advanced  CURRENT TREATMENT:  anastrozole   BREAST CANCER HISTORY: From the original integument:  Emerita had routine screening mammography at [facility] 09/14/2013. This showed a possible mass in the left breast. On 10/11/2013 she underwent left diagnostic mammography and ultrasonography which confirmed an irregular mass in the upper outer left breast measuring up to 3 cm. This was firm and fixed at the 2:30 o'clock position, 6 cm from the nipple. Ultrasound showed an irregular hypoechoic mass measuring 2.8 cm. The left axilla showed no evidence of adenopathy.  On 10/11/2013 the patient underwent left breast biopsy, with the pathology [facility] [id number]) showing an invasive ductal carcinoma, high-grade, estrogen receptor 100% positive, progesterone receptor 43% positive, both with strong staining intensity, with an MIB-1 of 87% and HER-2 amplified, the signals ratio being 2.72, the number per cell 3.40.  On 10/23/2013 the patient underwent bilateral breast MRI. This showed the breast and position to be density be. In the left breast there was a 3 cm irregular enhancing mass in the upper outer quadrant. There was a 9 mm oval mass in the inner lower left breast with no definite fatty hilum. There was a 1.4 cm upper left axillary lymph node with a thickened cortex. There were no other abnormal appearing lymph nodes. Ultrasound of the left breast 10/30/2013 found of the left axillary lymph node with the mildly asymmetrical cortex, and a 7 mm her mass in the 7:30 o'clock position of the left breast. Biopsy of both these suspicious areas was performed 10/30/2013, and the preliminary report is  that they're both benign.  The patient's subsequent history is as detailed below  INTERVAL HISTORY: Jae returns today for 65 follow up of her breast carcinoma, was triple positive. She has been on anastrozole now or about 65 year and a half. She has occasional hot flashes. These do wake her up at night about 3 or 4 times a week. Vaginal dryness is not a concern she obtains it at a good price  REVIEW OF SYSTEMS: Veta is doing some water aerobics but she has not been able to get back into a walking program. She has some back and toe pains that bother her when she walks too much. She complains of hearing loss, heartburn, and feeling "sluggish" sometimes. A detailed review of systems was otherwise stable.  PAST MEDICAL HISTORY: Past Medical History:  Diagnosis Date  . Abnormal cholesterol test   . Arthritis   . Cancer (HCC)    breast  . Diverticulosis   . GERD (gastroesophageal reflux disease)   . Hearing deficit    no hearing aids  . Hyperlipidemia   . Internal hemorrhoids   . Knee pain, left   . Radiation 05/02/14-06/19/14   Left Breast  . Tubulovillous adenoma of colon   . Wears glasses     PAST SURGICAL HISTORY: Past Surgical History:  Procedure Laterality Date  . COLONOSCOPY  2002   hemorrhoids  . COLONOSCOPY W/ POLYPECTOMY  12/09/2010   1 cm sigmoid polyp - , diverticulosis, hemorrhoids  . HEMORRHOID SURGERY    . KNEE SURGERY    . KNEE SURGERY  2012   Left knee arthroscopy  . PARTIAL MASTECTOMY WITH  NEEDLE LOCALIZATION AND AXILLARY SENTINEL LYMPH NODE BX Left 03/21/14   Dr.Faera Byerly  . PORT-A-CATH REMOVAL Left 01/01/2015   Procedure: REMOVAL PORT-A-CATH;  Surgeon: Stark Klein, MD;  Location: [place];  Service: General;  Laterality: Left;  . PORTACATH PLACEMENT Left 11/17/2013   Procedure: INSERTION PORT-A-CATH;  Surgeon: Stark Klein, MD;  Location: [place];  Service: General;  Laterality: Left;  . right knee arthroscopy  2011 Dr.  Aline Brochure  . TONSILLECTOMY AND ADENOIDECTOMY    . TOTAL KNEE ARTHROPLASTY  11/23/2011   Procedure: TOTAL KNEE ARTHROPLASTY;  Surgeon: Carole Civil, MD;  Location: [facility];  Service: Orthopedics;  Laterality: Left;  . TREATMENT FISTULA ANAL    . UPPER GASTROINTESTINAL ENDOSCOPY      FAMILY HISTORY Family History  Problem Relation Age of Onset  . Adopted: Yes  . Stroke Mother   . Heart attack Father    the patient is adopted and has no information on her biological family. the patient's adoptive father died at the age of 58 from a myocardial infarction; productive mother died from a stroke at age 43. The patient does not know of any siblings  GYNECOLOGIC HISTORY: (Reviewed 67/15/2015) Menarche age 65. The patient is GX P0. She underwent menopause in her early 57s and took hormone replacement until 2067.  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 She is single, and lives at home with her black lab, [name], and her cats Patches and [name]    ADVANCED DIRECTIVES: Not in place   HEALTH MAINTENANCE:  (Reviewed 67/15/2015) Social History  Substance Use Topics  . Smoking status: Never Smoker  . Smokeless tobacco: Never Used  . Alcohol use Yes     Comment: socially     Colonoscopy: OCT 2017 Carlean Purl  PAP: [facility]  Bone density: not on file  Lipid panel: not on file   Allergies  Allergen Reactions  . Sulfonamide Derivatives Anaphylaxis and Hives    Pt. Stated, "large welps on neck immediately."  . Triprolidine-Pseudoephedrine Other (See Comments)    This is Actifed. Increased heart rate to tachycardia.    Current Outpatient Prescriptions  Medication Sig Dispense Refill  . acetaminophen (TYLENOL) 500 MG tablet Take 500 mg by mouth as needed.    Marland Kitchen anastrozole (ARIMIDEX) 1 MG tablet Take 1 tablet (1 mg total) by mouth daily. 90 tablet 4  . loratadine (CLARITIN) 10 MG tablet Take 10 mg by  mouth daily.    Marland Kitchen losartan-hydrochlorothiazide (HYZAAR) 100-25 MG tablet Take 1 tablet by mouth daily.  12  . omeprazole (PRILOSEC) 20 MG capsule Take 20 mg by mouth as needed.    . simvastatin (ZOCOR) 10 MG tablet Take 10 mg by mouth daily. Pt TAKES AT BEDTIME     No current facility-administered medications for this visit.     OBJECTIVE: Middle-aged white woman In no acute distress Vitals:   03/05/16 1336  BP: 129/77  Pulse: 78  Resp: 19  Temp: 98.5 F (36.9 C)     Body mass index is 35.46 kg/m.    ECOG FS:1 - Symptomatic but completely ambulatory Filed Weights   03/05/16 1336  Weight: 233 lb 3.2 oz (105.8 kg)   Sclerae unicteric, pupils round and equal, racoon eyes Oropharynx clear and moist-- no thrush or other lesions No cervical or supraclavicular adenopathy Lungs no rales or rhonchi Heart regular rate and rhythm Abd  soft, nontender, positive bowel sounds MSK no focal spinal tenderness, no upper extremity lymphedema Neuro: nonfocal, well oriented, appropriate affect Breasts: Right breast is unremarkable. The left breast is status post lumpectomy and radiation. There is no evidence of local recurrence. The left axilla is benign.   LAB RESULTS:   Lab Results  Component Value Date   WBC 7.7 03/05/2016   NEUTROABS 4.9 03/05/2016   HGB 12.4 03/05/2016   HCT 38.1 03/05/2016   MCV 81.7 03/05/2016   PLT 257 03/05/2016      Chemistry      Component Value Date/Time   NA 144 09/05/2015 1333   K 3.3 (L) 09/05/2015 1333   CL 108 03/21/2014 0801   CO2 26 09/05/2015 1333   BUN 14.8 09/05/2015 1333   CREATININE 0.9 09/05/2015 1333      Component Value Date/Time   CALCIUM 9.4 09/05/2015 1333   ALKPHOS 117 09/05/2015 1333   AST 18 09/05/2015 1333   ALT 17 09/05/2015 1333   BILITOT 0.57 09/05/2015 1333       STUDIES: CLINICAL DATA:  Annual diagnostic mammogram. Patient underwent left breast lumpectomy in September 2015 for breast carcinoma. No  current complaints.  EXAM: 2D DIGITAL DIAGNOSTIC BILATERAL MAMMOGRAM WITH CAD AND ADJUNCT TOMO  COMPARISON:  Previous exam(s).  ACR Breast Density Category b: There are scattered areas of fibroglandular density.  FINDINGS: There are no new or suspicious masses. Architectural distortion in the posterior upper outer left breast reflects the postsurgical scarring. There are no other areas of architectural distortion. There are no suspicious calcifications.  Mammographic images were processed with CAD.  IMPRESSION: No evidence of recurrent or new breast malignancy. Benign postsurgical changes on the left.  RECOMMENDATION: Diagnostic mammography in 1 year per standard post lumpectomy protocol.  I have discussed the findings and recommendations with the patient. Results were also provided in writing at the conclusion of the visit. If applicable, a reminder letter will be sent to the patient regarding the next appointment.  BI-RADS CATEGORY  2: Benign.   Electronically Signed   By: Lajean Manes M.D.   On: 09/26/2015 14:06   ASSESSMENT: 65 y.o. [place] woman status post left breast biopsy 10/11/2013 for a clinical T2 N0, stage IIA invasive ductal carcinoma, grade 3, estrogen and progesterone receptor positive, with an MIB-1 of 87%, and HER-2 amplified  (a) additional Left breast and left axillary node biopsy 10/30/2013 benign  (1) started neoadjuvant therapy 11/20/2013 , consisting of carboplatin, docetaxel, trastuzumab and pertuzumab every 3 weeks x6 completed 03/05/2014   (2) trastuzumab alone to continued to complete a year; last dose 11/26/2014. Final echocardiogram 11/27/2014 shows a well preserved ejection fraction.  (3) Patient underwent left lumpectomy with sentinel node biopsy on 03/21/14. Pathology results demonstrated a complete response, pT0 pN0  (4) radiation to follow surgery   (5) anastrozole started 07/06/14   (a) DEXA scan 07/03/2014 was  normal, with the lowest T score -1.0  PLAN: Jonte is now 2 years out from definitive surgery for her breast cancer with no evidence of disease recurrence. This is very favorable  Since she had a complete pathologic response to her neoadjuvant chemotherapy, her risk of breast cancer recurrence over the next 10 years is well below 10%.  She is tolerating anastrozole generally well, except for the nighttime hot flashes. Today we discussed gabapentin and she has a good understanding of the possible toxicities, side effects and complications of this agent. She will receive 300 mg at bedtime does not  work or she has any side effects from that she will let me know.  Otherwise the plan is to continue anastrozole for a total of 5 years. She will see me again in one year. She knows to call for any problems that may develop before that visit.  Chauncey Cruel, MD  03/05/16 1:54 PM

## 2016-03-05 NOTE — Telephone Encounter (Signed)
appt made and avs printed °

## 2016-03-25 ENCOUNTER — Ambulatory Visit (AMBULATORY_SURGERY_CENTER): Payer: Self-pay | Admitting: *Deleted

## 2016-03-25 VITALS — Ht 68.0 in | Wt 232.0 lb

## 2016-03-25 DIAGNOSIS — Z86010 Personal history of colonic polyps: Secondary | ICD-10-CM

## 2016-03-25 NOTE — Progress Notes (Signed)
No egg or soy allergy known to patient  No issues with past sedation with any surgeries  or procedures, no intubation problems  No diet pills per patient No home 02 use per patient  No blood thinners per patient  Pt denies issues with constipation  No A fib or A flutter   emmi declined'   

## 2016-03-27 ENCOUNTER — Encounter: Payer: Self-pay | Admitting: Internal Medicine

## 2016-04-08 ENCOUNTER — Encounter: Payer: Medicare Other | Admitting: Internal Medicine

## 2016-04-09 ENCOUNTER — Ambulatory Visit (AMBULATORY_SURGERY_CENTER): Payer: Medicare Other | Admitting: Internal Medicine

## 2016-04-09 ENCOUNTER — Encounter: Payer: Self-pay | Admitting: Internal Medicine

## 2016-04-09 VITALS — BP 135/53 | HR 66 | Temp 97.8°F | Resp 19 | Ht 68.0 in | Wt 232.0 lb

## 2016-04-09 DIAGNOSIS — K5730 Diverticulosis of large intestine without perforation or abscess without bleeding: Secondary | ICD-10-CM

## 2016-04-09 DIAGNOSIS — Z86010 Personal history of colonic polyps: Secondary | ICD-10-CM

## 2016-04-09 MED ORDER — SODIUM CHLORIDE 0.9 % IV SOLN: 500.0000 mL | INTRAVENOUS | Status: DC

## 2016-04-09 NOTE — Patient Instructions (Addendum)
   No polyps today. You do have severe diverticulosis which made scope insertion difficult and makes me think no more colonoscopy. Will reassess in 10 years.  I appreciate the opportunity to care for you. Gatha Mayer, MD, FACG  YOU HAD AN ENDOSCOPIC PROCEDURE TODAY AT [facility]:   Refer to the procedure report that was given to you for any specific questions about what was found during the examination.  If the procedure report does not answer your questions, please call your gastroenterologist to clarify.  If you requested that your care partner not be given the details of your procedure findings, then the procedure report has been included in a sealed envelope for you to review at your convenience later.  YOU SHOULD EXPECT: Some feelings of bloating in the abdomen. Passage of more gas than usual.  Walking can help get rid of the air that was put into your GI tract during the procedure and reduce the bloating. If you had a lower endoscopy (such as a colonoscopy or flexible sigmoidoscopy) you may notice spotting of blood in your stool or on the toilet paper. If you underwent a bowel prep for your procedure, you may not have a normal bowel movement for a few days.  Please Note:  You might notice some irritation and congestion in your nose or some drainage.  This is from the oxygen used during your procedure.  There is no need for concern and it should clear up in a day or so.  SYMPTOMS TO REPORT IMMEDIATELY:   Following lower endoscopy (colonoscopy or flexible sigmoidoscopy):  Excessive amounts of blood in the stool  Significant tenderness or worsening of abdominal pains  Swelling of the abdomen that is new, acute  Fever of 100F or higher  For urgent or emergent issues, a gastroenterologist can be reached at any hour by calling [phone].   DIET:  We do recommend a small meal at first, but then you may proceed to your regular diet.  Drink plenty of fluids but  you should avoid alcoholic beverages for 24 hours.  ACTIVITY:  You should plan to take it easy for the rest of today and you should NOT DRIVE or use heavy machinery until tomorrow (because of the sedation medicines used during the test).    FOLLOW UP: Our staff will call the number listed on your records the next business day following your procedure to check on you and address any questions or concerns that you may have regarding the information given to you following your procedure. If we do not reach you, we will leave a message.  However, if you are feeling well and you are not experiencing any problems, there is no need to return our call.  We will assume that you have returned to your regular daily activities without incident.  SIGNATURES/CONFIDENTIALITY: You and/or your care partner have signed paperwork which will be entered into your electronic medical record.  These signatures attest to the fact that that the information above on your After Visit Summary has been reviewed and is understood.  Full responsibility of the confidentiality of this discharge information lies with you and/or your care-partner.  Please continue your normal medications  Please read over handouts about diverticulosis, hemrrhoids and high fiber diets

## 2016-04-09 NOTE — Op Note (Signed)
[place] Patient Name: April Walker Procedure Date: 04/09/2016 8:51 AM MRN: [id number] Endoscopist: Gatha Mayer , MD Age: 65 Referring MD:  Date of Birth: Mar 01, 1951 Gender: Female Account #: [id number] Procedure:                Colonoscopy Indications:              High risk colon cancer surveillance: Personal                            history of colonic polyps, Last colonoscopy: 2012 Medicines:                Propofol per Anesthesia, Monitored Anesthesia Care Procedure:                Pre-Anesthesia Assessment:                           - Prior to the procedure, a History and Physical                            was performed, and patient medications and                            allergies were reviewed. The patient's tolerance of                            previous anesthesia was also reviewed. The risks                            and benefits of the procedure and the sedation                            options and risks were discussed with the patient.                            All questions were answered, and informed consent                            was obtained. Prior Anticoagulants: The patient has                            taken no previous anticoagulant or antiplatelet                            agents. ASA Grade Assessment: II - A patient with                            mild systemic disease. After reviewing the risks                            and benefits, the patient was deemed in                            satisfactory condition to undergo the procedure.  After obtaining informed consent, the colonoscope                            was passed under direct vision. Throughout the                            procedure, the patient's blood pressure, pulse, and                            oxygen saturations were monitored continuously. The                            Model CF-HQ190L [id number] [id number]) scope was introduced             through the anus and advanced to the the cecum,                            identified by appendiceal orifice and ileocecal                            valve. The Model PCF-H190L [id number]) scope was                            introduced through the anus and advanced to the the                            cecum, identified by appendiceal orifice and                            ileocecal valve. The ileocecal valve, appendiceal                            orifice, and rectum were photographed. The quality                            of the bowel preparation was excellent. The bowel                            preparation used was Miralax. The colonoscopy was                            technically difficult and complex due to multiple                            diverticula in the colon, restricted mobility of                            the colon and a tortuous colon. Successful                            completion of the procedure was aided by                            withdrawing the scope and  replacing with the                            pediatric colonoscope. The patient tolerated the                            procedure well. Scope In: 9:02:23 AM Scope Out: 9:24:24 AM Scope Withdrawal Time: 0 hours 9 minutes 57 seconds  Total Procedure Duration: 0 hours 22 minutes 1 second  Findings:                 The perianal and digital rectal examinations were                            normal.                           Many small and large-mouthed diverticula were found                            in the sigmoid colon. There was narrowing of the                            colon in association with the diverticular opening.                            The scope was withdrawn and replaced with the                            pediatric colonoscope because of difficulty passing                            the scope.                           Internal hemorrhoids were found during retroflexion.                            The exam was otherwise without abnormality on                            direct and retroflexion views. Complications:            No immediate complications. Estimated blood loss:                            None. Estimated Blood Loss:     Estimated blood loss: none. Impression:               - Severe diverticulosis in the sigmoid colon. There                            was narrowing of the colon in association with the                            diverticular opening.                           -  Internal hemorrhoids.                           - The examination was otherwise normal on direct                            and retroflexion views.                           - No specimens collected. Recommendation:           - Resume previous diet.                           - Continue present medications.                           - Patient has a contact number available for                            emergencies. The signs and symptoms of potential                            delayed complications were discussed with the                            patient. Return to normal activities tomorrow.                            Written discharge instructions were provided to the                            patient.                           - Resume previous diet.                           - Continue present medications.                           - Repeat colonoscopy vs other testing to be                            considered in 10 years. Given that she will be 19                            and this exam was difficult even using pediatric                            colonoscope likely to avoid colonoscopy then                            depending upon clinical situation at that time. Gatha Mayer, MD 04/09/2016 9:34:30 AM This report has been signed electronically.

## 2016-04-09 NOTE — Progress Notes (Signed)
934- pt c/o abd pain, rating as a "10".  HOB down and knees pulled to chest.  Passed air and did state some relief from gas.  Pt has been turned to right side and back to left with HOB down; passed large amt of air while in the RR.  I did take her to the bathroom and she was able to pass more air.  Rates abd pain as a "4" now, no further c/o nausea. Abdomen soft, easily palpable.

## 2016-04-09 NOTE — Progress Notes (Signed)
Report to PACU, RN, vss, BBS= Clear.  

## 2016-04-09 NOTE — Progress Notes (Signed)
Ok to discharge per Dr. Carlean Purl

## 2016-04-10 ENCOUNTER — Telehealth: Payer: Self-pay

## 2016-04-10 NOTE — Telephone Encounter (Signed)
  Follow up Call-  Call back number 04/09/2016  Post procedure Call Back phone  # [phone]  Permission to leave phone message Yes  Some recent data might be hidden     Patient questions:  Do you have a fever, pain , or abdominal swelling? No. Pain Score  0 *  Have you tolerated food without any problems? Yes.    Have you been able to return to your normal activities? Yes.    Do you have any questions about your discharge instructions: Diet   No. Medications  No. Follow up visit  No.  Do you have questions or concerns about your Care? No.  Actions: * If pain score is 4 or above: No action needed, pain <4.

## 2016-05-06 DIAGNOSIS — H20012 Primary iridocyclitis, left eye: Secondary | ICD-10-CM | POA: Diagnosis not present

## 2016-05-21 DIAGNOSIS — S46011A Strain of muscle(s) and tendon(s) of the rotator cuff of right shoulder, initial encounter: Secondary | ICD-10-CM | POA: Diagnosis not present

## 2016-05-21 DIAGNOSIS — I10 Essential (primary) hypertension: Secondary | ICD-10-CM | POA: Diagnosis not present

## 2016-06-11 DIAGNOSIS — M25511 Pain in right shoulder: Secondary | ICD-10-CM | POA: Diagnosis not present

## 2016-06-12 ENCOUNTER — Other Ambulatory Visit: Payer: Self-pay | Admitting: Internal Medicine

## 2016-06-15 ENCOUNTER — Other Ambulatory Visit: Payer: Self-pay | Admitting: Internal Medicine

## 2016-06-15 DIAGNOSIS — M25511 Pain in right shoulder: Secondary | ICD-10-CM

## 2016-06-22 DIAGNOSIS — C50412 Malignant neoplasm of upper-outer quadrant of left female breast: Secondary | ICD-10-CM | POA: Diagnosis not present

## 2016-06-24 ENCOUNTER — Ambulatory Visit
Admission: RE | Admit: 2016-06-24 | Discharge: 2016-06-24 | Disposition: A | Discharge status: Home / Self Care | Payer: Medicare Other | Source: Ambulatory Visit | Attending: Internal Medicine | Admitting: Internal Medicine

## 2016-06-24 DIAGNOSIS — M25511 Pain in right shoulder: Secondary | ICD-10-CM | POA: Diagnosis not present

## 2016-07-08 DIAGNOSIS — M7541 Impingement syndrome of right shoulder: Secondary | ICD-10-CM | POA: Diagnosis not present

## 2016-07-15 DIAGNOSIS — M7541 Impingement syndrome of right shoulder: Secondary | ICD-10-CM | POA: Diagnosis not present

## 2016-07-17 DIAGNOSIS — M7541 Impingement syndrome of right shoulder: Secondary | ICD-10-CM | POA: Diagnosis not present

## 2016-07-21 DIAGNOSIS — M7541 Impingement syndrome of right shoulder: Secondary | ICD-10-CM | POA: Diagnosis not present

## 2016-07-28 DIAGNOSIS — M7541 Impingement syndrome of right shoulder: Secondary | ICD-10-CM | POA: Diagnosis not present

## 2016-07-30 DIAGNOSIS — M7541 Impingement syndrome of right shoulder: Secondary | ICD-10-CM | POA: Diagnosis not present

## 2016-08-04 DIAGNOSIS — M7541 Impingement syndrome of right shoulder: Secondary | ICD-10-CM | POA: Diagnosis not present

## 2016-08-05 DIAGNOSIS — M7541 Impingement syndrome of right shoulder: Secondary | ICD-10-CM | POA: Diagnosis not present

## 2016-08-14 ENCOUNTER — Other Ambulatory Visit: Payer: Self-pay | Admitting: Oncology

## 2016-08-14 DIAGNOSIS — Z853 Personal history of malignant neoplasm of breast: Secondary | ICD-10-CM

## 2016-09-28 ENCOUNTER — Ambulatory Visit
Admission: RE | Admit: 2016-09-28 | Discharge: 2016-09-28 | Disposition: A | Discharge status: Home / Self Care | Payer: Medicare Other | Source: Ambulatory Visit | Attending: Oncology | Admitting: Oncology

## 2016-09-28 DIAGNOSIS — R921 Mammographic calcification found on diagnostic imaging of breast: Secondary | ICD-10-CM | POA: Diagnosis not present

## 2016-09-28 DIAGNOSIS — Z853 Personal history of malignant neoplasm of breast: Secondary | ICD-10-CM

## 2016-09-28 HISTORY — DX: Personal history of antineoplastic chemotherapy: Z92.21

## 2016-11-04 DIAGNOSIS — E7800 Pure hypercholesterolemia, unspecified: Secondary | ICD-10-CM | POA: Diagnosis not present

## 2016-11-04 DIAGNOSIS — Z7289 Other problems related to lifestyle: Secondary | ICD-10-CM | POA: Diagnosis not present

## 2016-11-04 DIAGNOSIS — I10 Essential (primary) hypertension: Secondary | ICD-10-CM | POA: Diagnosis not present

## 2016-11-04 DIAGNOSIS — Z0000 Encounter for general adult medical examination without abnormal findings: Secondary | ICD-10-CM | POA: Diagnosis not present

## 2016-11-04 DIAGNOSIS — C50919 Malignant neoplasm of unspecified site of unspecified female breast: Secondary | ICD-10-CM | POA: Diagnosis not present

## 2017-01-18 ENCOUNTER — Ambulatory Visit (INDEPENDENT_AMBULATORY_CARE_PROVIDER_SITE_OTHER): Payer: Medicare Other | Admitting: Orthopedic Surgery

## 2017-01-18 ENCOUNTER — Encounter: Payer: Self-pay | Admitting: Orthopedic Surgery

## 2017-01-18 ENCOUNTER — Ambulatory Visit (INDEPENDENT_AMBULATORY_CARE_PROVIDER_SITE_OTHER): Payer: Medicare Other

## 2017-01-18 DIAGNOSIS — Z96652 Presence of left artificial knee joint: Secondary | ICD-10-CM | POA: Diagnosis not present

## 2017-01-18 NOTE — Progress Notes (Signed)
ANNUAL FOLLOW UP FOR  LEFT TKA   Chief Complaint  Patient presents with  . Follow-up    ANNUAL XRAY LT TKA, DOS 11/23/11     HPI: The patient is here for the annual  follow-up x-ray for knee replacement   Review of Systems  Musculoskeletal:       She had 2 episodes medial knee pain after long walks resolved after ice elevation and rest      Examination of the LEFT KNEE   Inspection shows : incision healed nicely without erythema, no tenderness no swelling  Range of motion total range of motion is Measured 115 degrees  Stability the knee is stable anterior to posterior as well as medial to lateral  Strength quadriceps strength is normal  Skin no erythema around the skin incision  Cardiovascular NO EDEMA    Medical decision-making section  X-rays ordered with the following personal interpretation  Normal alignment without loosening   Diagnosis  Encounter Diagnosis  Name Primary?  . History of left knee replacement Yes     Plan follow-up 1 year repeat x-rays

## 2017-02-11 ENCOUNTER — Other Ambulatory Visit: Payer: Self-pay | Admitting: Oncology

## 2017-02-11 DIAGNOSIS — R921 Mammographic calcification found on diagnostic imaging of breast: Secondary | ICD-10-CM

## 2017-02-19 IMAGING — MR MR SHOULDER*R* W/O CM
4 of 5 series · 14 of 40 positions shown · non-contrast
Comparison: None.

CLINICAL DATA: Right shoulder pain and weakness for 4 months.

EXAM:
MRI OF THE RIGHT SHOULDER WITHOUT CONTRAST
TECHNIQUE: Multiplanar, multisequence MR imaging of the shoulder was performed.
No intravenous contrast was administered.

[Series 6: T2 fat-sat · axial · right · 3.0mm · 0.44mm/px · z∈[+8,+66]mm · 3 of 25 slices shown (1 of 3)]
[im 4/25]
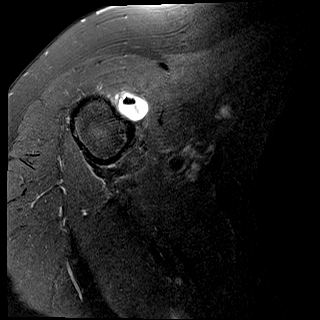
[im 14/25]
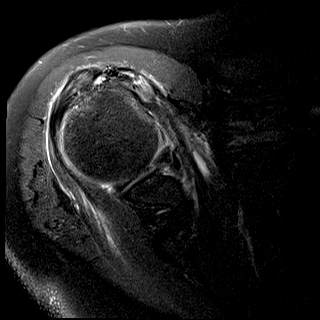
[im 21/25]
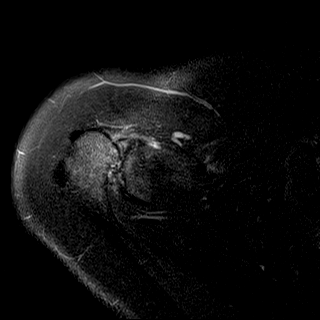

[Series 7: T2 fat-sat · coronal · right · 3.0mm · 0.44mm/px · 3 of 21 slices shown (2 of 3)]
[im 3/21]
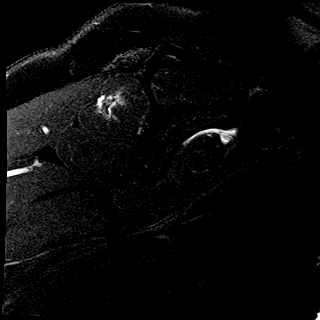
[im 12/21]
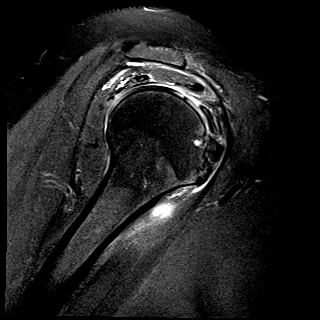
[im 18/21]
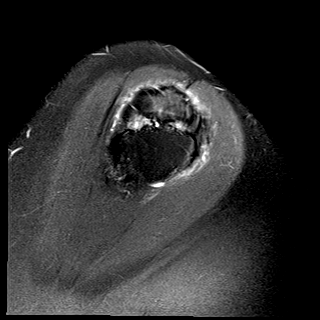

[Series 9: PD · oblique · right · 3.0mm · 0.18mm/px · 5 of 21 slices shown]
[im 1/21]
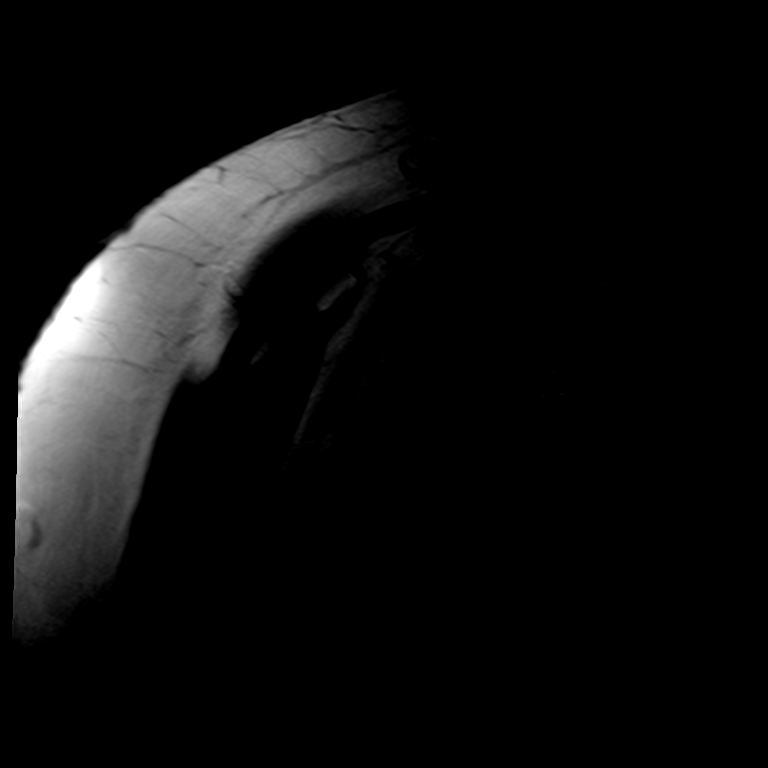
[im 3/21]
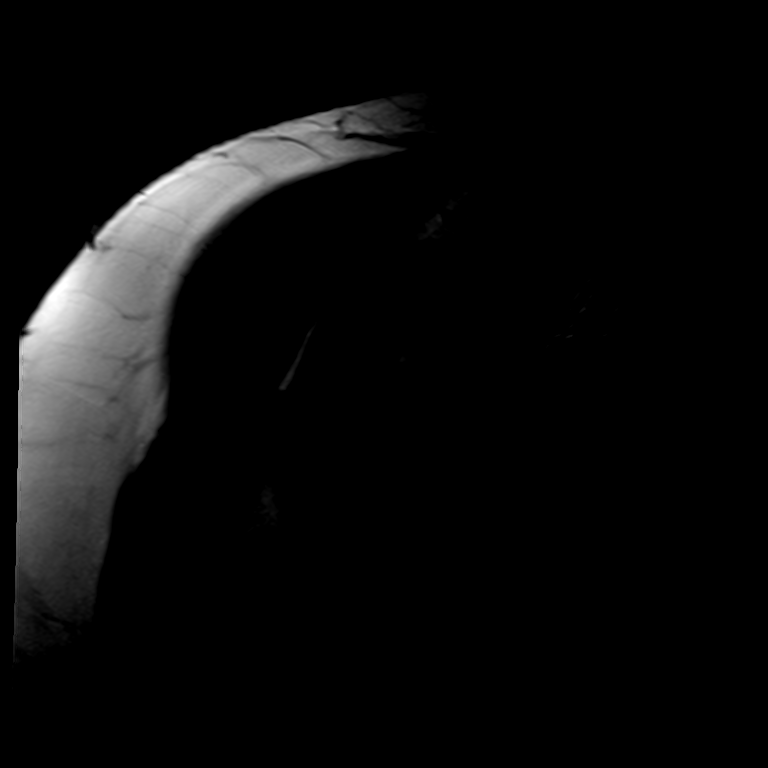
[im 6/21]
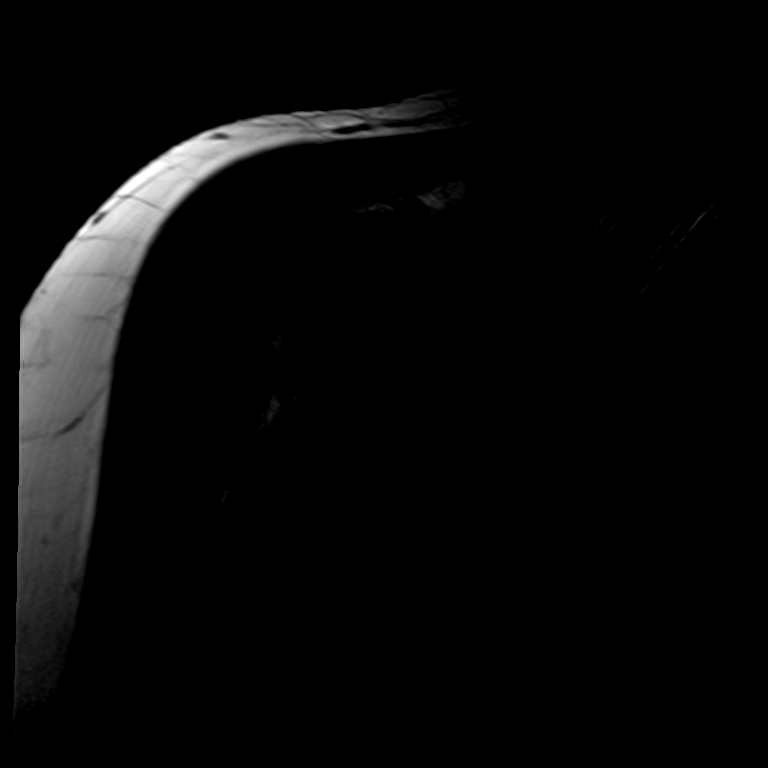
[im 12/21]
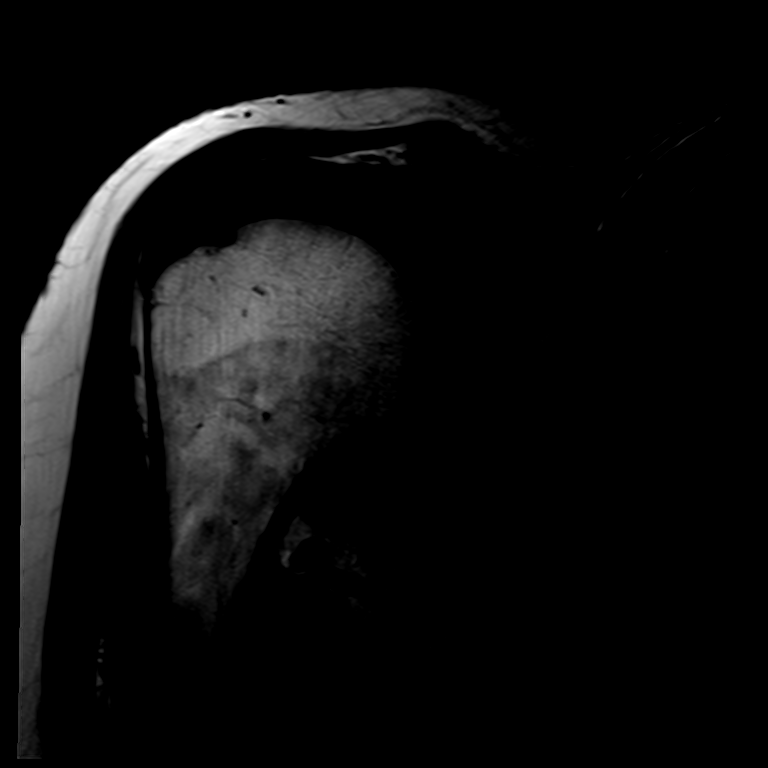
[im 18/21]
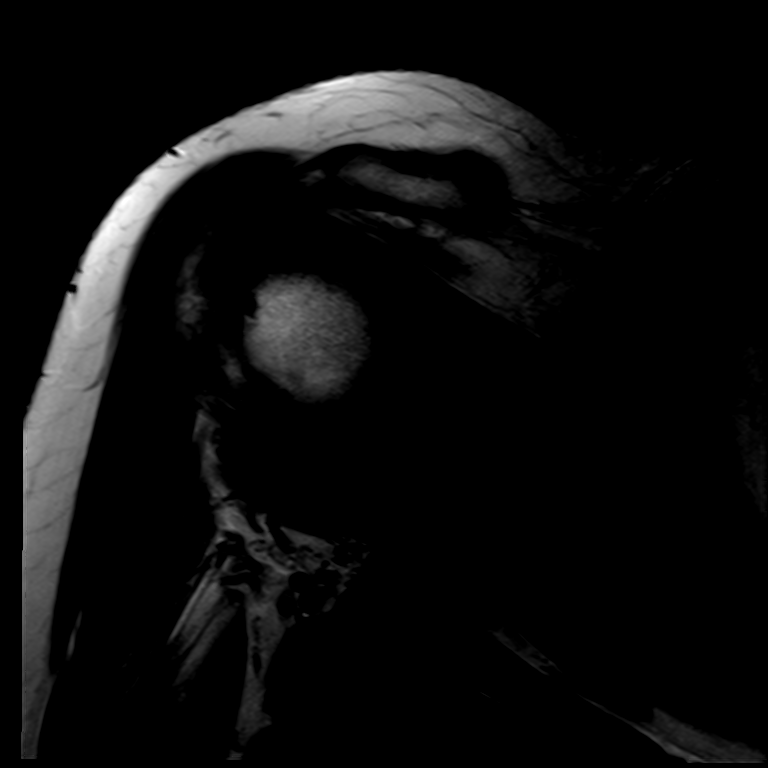

[Series 10: T2 fat-sat · oblique · right · 3.0mm · 0.22mm/px · 3 of 21 slices shown (3 of 3)]
[im 3/21]
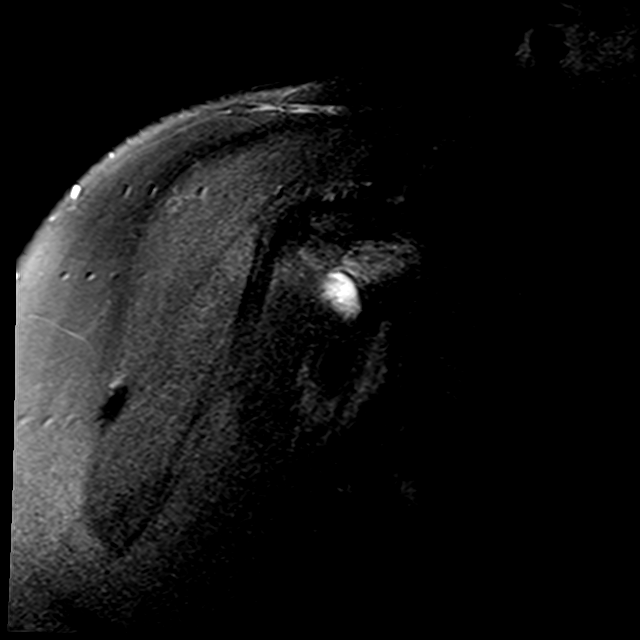
[im 12/21]
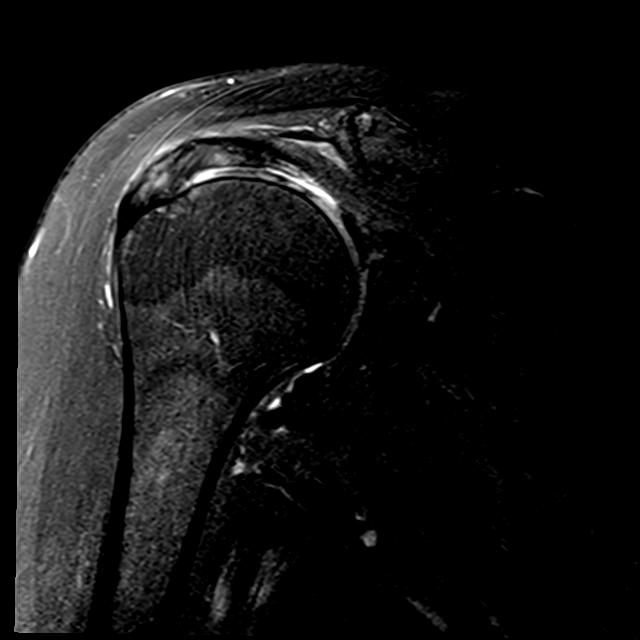
[im 18/21]
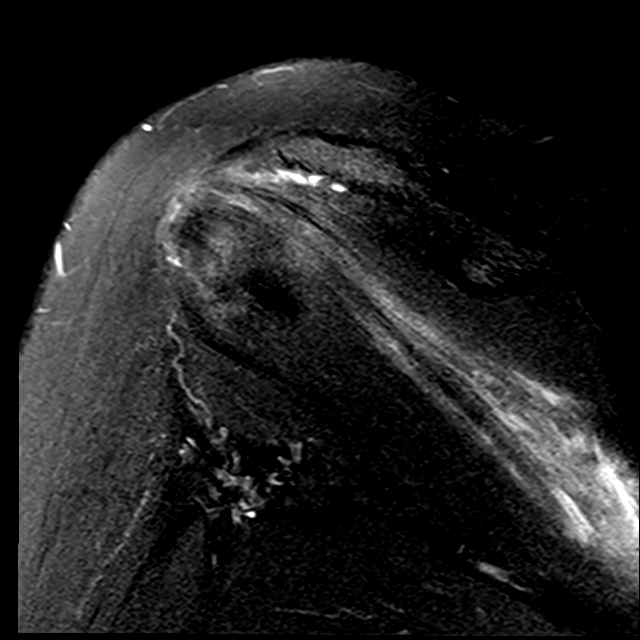

[14 of 40 positions shown; findings below may reference images not displayed]

FINDINGS: Rotator cuff: Severe tendinosis of the supraspinatus tendon with
possible small full-thickness tear of the anterior fibers. Small
insertional interstitial tear of the supraspinatus tendon. Severe
tendinosis of the infraspinatus tendon with a interstitial tear at
the musculotendinous junction extending into the muscle. Teres minor
tendon is intact. Subscapularis tendon is intact.

Muscles: No significant muscle atrophy of the rotator cuff
musculature. Interstitial tear of the infraspinatus tendon at the
musculotendinous junction dissects into the muscle.

Biceps long head: Severe tendinosis of the intraarticular and
proximal extra-articular portion of the long head of the biceps
tendon with tenosynovitis.

Acromioclavicular Joint: Moderate arthropathy of the
acromioclavicular joint. Type I acromion. Small amount of
subacromial/ subdeltoid bursal fluid.

Glenohumeral Joint: No joint effusion.  Mild chondromalacia.

Labrum: Grossly intact, but evaluation is limited by lack of
intraarticular fluid.

Bones:  No marrow signal abnormality.  No fracture or dislocation.

Other: No fluid collection or hematoma.
IMPRESSION: 1. Severe tendinosis of the supraspinatus tendon with possible small
full-thickness tear of the anterior fibers. Small insertional
interstitial tear of the supraspinatus tendon.
2. Severe tendinosis of the infraspinatus tendon with a interstitial
tear at the musculotendinous junction extending into the muscle.
3. Severe tendinosis of the intraarticular and proximal
extra-articular portion of the long head of the biceps tendon with
tenosynovitis.

## 2017-03-04 ENCOUNTER — Other Ambulatory Visit (HOSPITAL_BASED_OUTPATIENT_CLINIC_OR_DEPARTMENT_OTHER): Payer: Medicare Other

## 2017-03-04 ENCOUNTER — Ambulatory Visit (HOSPITAL_BASED_OUTPATIENT_CLINIC_OR_DEPARTMENT_OTHER): Payer: Medicare Other | Admitting: Oncology

## 2017-03-04 VITALS — BP 123/51 | HR 77 | Temp 97.5°F | Resp 17 | Ht 68.0 in | Wt 216.1 lb

## 2017-03-04 DIAGNOSIS — Z170 Estrogen receptor positive status [ER+]: Secondary | ICD-10-CM | POA: Diagnosis not present

## 2017-03-04 DIAGNOSIS — C50412 Malignant neoplasm of upper-outer quadrant of left female breast: Secondary | ICD-10-CM | POA: Diagnosis not present

## 2017-03-04 DIAGNOSIS — Z79811 Long term (current) use of aromatase inhibitors: Secondary | ICD-10-CM | POA: Diagnosis not present

## 2017-03-04 LAB — CBC WITH DIFFERENTIAL/PLATELET
BASO%: 0.6 % (ref 0.0–2.0)
Basophils Absolute: 0 10*3/uL (ref 0.0–0.1)
EOS ABS: 0.2 10*3/uL (ref 0.0–0.5)
EOS%: 2.4 % (ref 0.0–7.0)
HCT: 37.5 % (ref 34.8–46.6)
HEMOGLOBIN: 12.3 g/dL (ref 11.6–15.9)
LYMPH#: 1.9 10*3/uL (ref 0.9–3.3)
LYMPH%: 26.6 % (ref 14.0–49.7)
MCH: 27.6 pg (ref 25.1–34.0)
MCHC: 32.8 g/dL (ref 31.5–36.0)
MCV: 84.3 fL (ref 79.5–101.0)
MONO#: 0.5 10*3/uL (ref 0.1–0.9)
MONO%: 7.5 % (ref 0.0–14.0)
NEUT%: 62.9 % (ref 38.4–76.8)
NEUTROS ABS: 4.5 10*3/uL (ref 1.5–6.5)
Platelets: 220 10*3/uL (ref 145–400)
RBC: 4.45 10*6/uL (ref 3.70–5.45)
RDW: 14.2 % (ref 11.2–14.5)
WBC: 7.1 10*3/uL (ref 3.9–10.3)

## 2017-03-04 LAB — COMPREHENSIVE METABOLIC PANEL
ALBUMIN: 3.7 g/dL (ref 3.5–5.0)
ALK PHOS: 106 U/L (ref 40–150)
ALT: 13 U/L (ref 0–55)
AST: 16 U/L (ref 5–34)
Anion Gap: 8 mEq/L (ref 3–11)
BILIRUBIN TOTAL: 0.71 mg/dL (ref 0.20–1.20)
BUN: 14.9 mg/dL (ref 7.0–26.0)
CO2: 31 mEq/L — ABNORMAL HIGH (ref 22–29)
Calcium: 9.8 mg/dL (ref 8.4–10.4)
Chloride: 104 mEq/L (ref 98–109)
Creatinine: 0.8 mg/dL (ref 0.6–1.1)
EGFR: 74 mL/min/{1.73_m2} — AB (ref >90)
GLUCOSE: 112 mg/dL (ref 70–140)
Potassium: 3.2 mEq/L — ABNORMAL LOW (ref 3.5–5.1)
SODIUM: 144 meq/L (ref 136–145)
TOTAL PROTEIN: 7.1 g/dL (ref 6.4–8.3)

## 2017-03-04 NOTE — Progress Notes (Signed)
[place]  Telephone:(336) [phone] Fax:(336) [phone]     ID: April Walker OB: Oct 25, 1950  MR#: [id number]  MHD#:[id number]  PCP: Levin Erp, MD GYN:  Vania Rea SU: Stark Klein  OTHER MD: [name], Silvano Rusk, Arther Abbott  CHIEF COMPLAINT: Left Breast Cancer, locally advanced  CURRENT TREATMENT:  anastrozole   BREAST CANCER HISTORY: From the original integument:  Avonda had routine screening mammography at [facility] 09/14/2013. This showed a possible mass in the left breast. On 10/11/2013 she underwent left diagnostic mammography and ultrasonography which confirmed an irregular mass in the upper outer left breast measuring up to 3 cm. This was firm and fixed at the 2:30 o'clock position, 6 cm from the nipple. Ultrasound showed an irregular hypoechoic mass measuring 2.8 cm. The left axilla showed no evidence of adenopathy.  On 10/11/2013 the patient underwent left breast biopsy, with the pathology [facility] [phone]) showing an invasive ductal carcinoma, high-grade, estrogen receptor 100% positive, progesterone receptor 43% positive, both with strong staining intensity, with an MIB-1 of 87% and HER-2 amplified, the signals ratio being 2.72, the number per cell 3.40.  On 10/23/2013 the patient underwent bilateral breast MRI. This showed the breast and position to be density be. In the left breast there was a 3 cm irregular enhancing mass in the upper outer quadrant. There was a 9 mm oval mass in the inner lower left breast with no definite fatty hilum. There was a 1.4 cm upper left axillary lymph node with a thickened cortex. There were no other abnormal appearing lymph nodes. Ultrasound of the left breast 10/30/2013 found of the left axillary lymph node with the mildly asymmetrical cortex, and a 7 mm her mass in the 7:30 o'clock position of the left breast. Biopsy of both these suspicious areas was performed 10/30/2013, and the preliminary report is that  they're both benign.  The patient's subsequent history is as detailed below  INTERVAL HISTORY: April Walker returns today for follow-up and treatment of her estrogen receptor positive breast cancer. She continues on anastrozole, with good tolerance.Marland Kitchen Hot flashes and vaginal dryness are not a major issue. She never developed the arthralgias or myalgias that many patients can experience on this medication. She obtains it at a good price.  REVIEW OF SYSTEMS: April Walker she exercises very irregularly in a variety of ways including yoga, walking, and other group activities. She is a little bit stiff when she does not exercise. She has stress headaches sometimes. A detailed review of systems today was otherwise stable  PAST MEDICAL HISTORY: Past Medical History:  Diagnosis Date  . Abnormal cholesterol test   . Allergy    seasonal -  . Arthritis   . Cancer [facility])    breast- left -chemo and radiation s/p lumpectomy  . Cataract    removed both eyes  . Diverticulosis   . GERD (gastroesophageal reflux disease)   . Hearing deficit    no hearing aids  . History of colon polyps 12/09/2010  . Hyperlipidemia   . Hypertension    mild   . Internal hemorrhoids   . Knee pain, left   . Personal history of chemotherapy   . Radiation 05/02/14-06/19/14   Left Breast  . Tubulovillous adenoma of colon   . Wears glasses     PAST SURGICAL HISTORY: Past Surgical History:  Procedure Laterality Date  . BREAST LUMPECTOMY Left    2015  . CATARACT EXTRACTION, BILATERAL  04/2014   bilateral dr Katy Fitch   .  COLONOSCOPY  2002   hemorrhoids  . COLONOSCOPY W/ POLYPECTOMY  12/09/2010   1 cm sigmoid polyp - , diverticulosis, hemorrhoids  . HEMORRHOID SURGERY    . KNEE SURGERY    . KNEE SURGERY  2012   Left knee arthroscopy  . PARTIAL MASTECTOMY WITH NEEDLE LOCALIZATION AND AXILLARY SENTINEL LYMPH NODE BX Left 03/21/14   Dr.Faera Byerly  . POLYPECTOMY    . PORT-A-CATH REMOVAL Left 01/01/2015   Procedure: REMOVAL  PORT-A-CATH;  Surgeon: Stark Klein, MD;  Location: [place];  Service: General;  Laterality: Left;  . PORTACATH PLACEMENT Left 11/17/2013   Procedure: INSERTION PORT-A-CATH;  Surgeon: Stark Klein, MD;  Location: [place];  Service: General;  Laterality: Left;  . right knee arthroscopy  2011 Dr. Aline Brochure  . TONSILLECTOMY AND ADENOIDECTOMY    . TOTAL KNEE ARTHROPLASTY  11/23/2011   Procedure: TOTAL KNEE ARTHROPLASTY;  Surgeon: Carole Civil, MD;  Location: [facility];  Service: Orthopedics;  Laterality: Left;  . TREATMENT FISTULA ANAL    . UPPER GASTROINTESTINAL ENDOSCOPY      FAMILY HISTORY Family History  Problem Relation Age of Onset  . Adopted: Yes  . Stroke Mother   . Heart attack Father    the patient is adopted and has no information on her biological family. the patient's adoptive father died at the age of 76 from a myocardial infarction; productive mother died from a stroke at age 80. The patient does not know of any siblings  GYNECOLOGIC HISTORY: (Reviewed 12/18/2013) Menarche age 70. The patient is GX P0. She underwent menopause in her early 45s and took hormone replacement until 2010.  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 She is single, and lives at home with her black lab, [name], and her cats Patches and [name]    ADVANCED DIRECTIVES: Not in place   HEALTH MAINTENANCE:  (Reviewed 12/18/2013) Social History  Substance Use Topics  . Smoking status: Never Smoker  . Smokeless tobacco: Never Used  . Alcohol use Yes     Comment: socially     Colonoscopy: OCT 2017 Carlean Purl  PAP: Stann Mainland  Bone density: not on file  Lipid panel: not on file   Allergies  Allergen Reactions  . Sulfonamide Derivatives Anaphylaxis and Hives    Pt. Stated, "large welps on neck immediately."  . Triprolidine-Pseudoephedrine Other (See Comments)    This is Actifed.  Increased heart rate to tachycardia.    Current Outpatient Prescriptions  Medication Sig Dispense Refill  . acetaminophen (TYLENOL) 500 MG tablet Take 500 mg by mouth as needed.    Marland Kitchen anastrozole (ARIMIDEX) 1 MG tablet Take 1 tablet (1 mg total) by mouth daily. 90 tablet 4  . loratadine (CLARITIN) 10 MG tablet Take 10 mg by mouth daily.    Marland Kitchen losartan-hydrochlorothiazide (HYZAAR) 100-25 MG tablet Take 1 tablet by mouth daily.  12  . omeprazole (PRILOSEC) 20 MG capsule Take 20 mg by mouth as needed.    . simvastatin (ZOCOR) 10 MG tablet Take 10 mg by mouth daily. Pt TAKES AT BEDTIME     Current Facility-Administered Medications  Medication Dose Route Frequency Provider Last Rate Last Dose  . 0.9 %  sodium chloride infusion  500 mL Intravenous Continuous Gatha Mayer, MD        OBJECTIVE: Middle-aged white woman Who appears well  Vitals:   03/04/17 1422  BP: Marland Kitchen)  123/51  Pulse: 77  Resp: 17  Temp: (!) 97.5 F (36.4 C)  SpO2: 97%     Body mass index is 32.86 kg/m.    ECOG FS:1 - Symptomatic but completely ambulatory Filed Weights   03/04/17 1422  Weight: 216 lb 1.6 oz (98 kg)   Sclerae unicteric, EOMs intact Oropharynx clear and moist No cervical or supraclavicular adenopathy Lungs no rales or rhonchi Heart regular rate and rhythm Abd soft, nontender, positive bowel sounds MSK no focal spinal tenderness, no upper extremity lymphedema Neuro: nonfocal, well oriented, appropriate affect Breasts: Right breast is benign. The left breast has undergone lumpectomy and radiation, with no evidence of local recurrence. Both axillae are benign.  LAB RESULTS:   Lab Results  Component Value Date   WBC 7.1 03/04/2017   NEUTROABS 4.5 03/04/2017   HGB 12.3 03/04/2017   HCT 37.5 03/04/2017   MCV 84.3 03/04/2017   PLT 220 03/04/2017      Chemistry      Component Value Date/Time   NA 144 03/04/2017 1347   K 3.2 (L) 03/04/2017 1347   CL 108 03/21/2014 0801   CO2 31 (H)  03/04/2017 1347   BUN 14.9 03/04/2017 1347   CREATININE 0.8 03/04/2017 1347      Component Value Date/Time   CALCIUM 9.8 03/04/2017 1347   ALKPHOS 106 03/04/2017 1347   AST 16 03/04/2017 1347   ALT 13 03/04/2017 1347   BILITOT 0.71 03/04/2017 1347       STUDIES: Bilateral diagnostic mammography with [facility] at [facility] 09/28/2016 found the breast density to be category B. An area of dystrophic calcifications measuring 0.4 cm at the lumpectomy site is felt to be benign, but six-month follow-up was suggested. That is scheduled for 04/01/2017.  ASSESSMENT: 66 y.o. April Walker woman status post left breast biopsy 10/11/2013 for a clinical T2 N0, stage IIA invasive ductal carcinoma, grade 3, estrogen and progesterone receptor positive, with an MIB-1 of 87%, and HER-2 amplified  (a) additional Left breast and left axillary node biopsy 10/30/2013 benign  (1) started neoadjuvant therapy 11/20/2013 , consisting of carboplatin, docetaxel, trastuzumab and pertuzumab every 3 weeks x6 completed 03/05/2014   (2) trastuzumab alone to continued to complete a year; last dose 11/26/2014. Final echocardiogram 11/27/2014 shows a well preserved ejection fraction.  (3) Patient underwent left lumpectomy with sentinel node biopsy on 03/21/14. Pathology results demonstrated a complete response, pT0 pN0  (4) adjuvant radiation to the left breast 61 Gy completed 06/19/14  (5) anastrozole started 07/06/14   (a) DEXA scan 07/03/2014 was normal, with the lowest T score -1.0  PLAN: Earle is now just about 3 years out from definitive surgery for her breast cancer with no evidence of disease recurrence. This is very favorable.  She is tolerating the anastrozole well and the plan will be to continue that for a total of 5 years. That will take [place] through December 2020  She is due for a bone density. I have put in for her to have that done in September of this year, at the same time as she has her follow-up  mammogram for the calcifications in the left breast. We discussed those today. They are likely benign but they could represent noninvasive disease and might require biopsy  I congratulated her on her excellent exercise program. I also mentioned the tai chi here every Wednesday morning. She is interested in participating  Otherwise she will return to see me April of next year. She knows to  call for any problems that may develop before then. Chauncey Cruel, MD  03/04/17 2:45 PM

## 2017-03-18 ENCOUNTER — Other Ambulatory Visit: Payer: Self-pay | Admitting: Oncology

## 2017-03-18 DIAGNOSIS — C50412 Malignant neoplasm of upper-outer quadrant of left female breast: Secondary | ICD-10-CM

## 2017-03-18 DIAGNOSIS — M8580 Other specified disorders of bone density and structure, unspecified site: Secondary | ICD-10-CM

## 2017-03-18 DIAGNOSIS — Z170 Estrogen receptor positive status [ER+]: Principal | ICD-10-CM

## 2017-03-21 ENCOUNTER — Other Ambulatory Visit: Payer: Self-pay | Admitting: Oncology

## 2017-03-21 DIAGNOSIS — C50412 Malignant neoplasm of upper-outer quadrant of left female breast: Secondary | ICD-10-CM

## 2017-04-01 ENCOUNTER — Ambulatory Visit
Admission: RE | Admit: 2017-04-01 | Discharge: 2017-04-01 | Disposition: A | Discharge status: Home / Self Care | Payer: Medicare Other | Source: Ambulatory Visit | Attending: Oncology | Admitting: Oncology

## 2017-04-01 DIAGNOSIS — R921 Mammographic calcification found on diagnostic imaging of breast: Secondary | ICD-10-CM

## 2017-04-01 DIAGNOSIS — M8580 Other specified disorders of bone density and structure, unspecified site: Secondary | ICD-10-CM

## 2017-04-01 DIAGNOSIS — C50412 Malignant neoplasm of upper-outer quadrant of left female breast: Secondary | ICD-10-CM

## 2017-04-01 DIAGNOSIS — Z170 Estrogen receptor positive status [ER+]: Principal | ICD-10-CM

## 2017-09-09 ENCOUNTER — Other Ambulatory Visit: Payer: Self-pay | Admitting: Oncology

## 2017-09-09 DIAGNOSIS — R921 Mammographic calcification found on diagnostic imaging of breast: Secondary | ICD-10-CM

## 2017-09-09 DIAGNOSIS — Z853 Personal history of malignant neoplasm of breast: Secondary | ICD-10-CM

## 2017-09-28 ENCOUNTER — Other Ambulatory Visit: Payer: Self-pay | Admitting: Oncology

## 2017-09-28 ENCOUNTER — Other Ambulatory Visit: Payer: Self-pay

## 2017-09-28 DIAGNOSIS — R921 Mammographic calcification found on diagnostic imaging of breast: Secondary | ICD-10-CM

## 2017-09-28 DIAGNOSIS — Z853 Personal history of malignant neoplasm of breast: Secondary | ICD-10-CM

## 2017-09-30 ENCOUNTER — Other Ambulatory Visit: Payer: Self-pay | Admitting: Adult Health

## 2017-09-30 ENCOUNTER — Ambulatory Visit
Admission: RE | Admit: 2017-09-30 | Discharge: 2017-09-30 | Disposition: A | Discharge status: Home / Self Care | Payer: Medicare Other | Source: Ambulatory Visit | Attending: Oncology | Admitting: Oncology

## 2017-09-30 DIAGNOSIS — R921 Mammographic calcification found on diagnostic imaging of breast: Secondary | ICD-10-CM

## 2017-09-30 DIAGNOSIS — Z853 Personal history of malignant neoplasm of breast: Secondary | ICD-10-CM

## 2017-10-27 ENCOUNTER — Other Ambulatory Visit: Payer: Self-pay | Admitting: *Deleted

## 2017-10-27 DIAGNOSIS — Z170 Estrogen receptor positive status [ER+]: Principal | ICD-10-CM

## 2017-10-27 DIAGNOSIS — C50412 Malignant neoplasm of upper-outer quadrant of left female breast: Secondary | ICD-10-CM

## 2017-10-27 NOTE — Progress Notes (Signed)
[place]  Telephone:(336) [phone] Fax:(336) [phone]     ID: Greg Eckrich OB: [birth year]  MR#: [id number]  ZYS#:[id number]  PCP: Levin Erp, MD GYN:  Vania Rea SU: Stark Klein  OTHER MD: [name], Silvano Rusk, Arther Abbott  CHIEF COMPLAINT: Left Breast Cancer, locally advanced  CURRENT TREATMENT:  anastrozole   BREAST CANCER HISTORY: From the original integument:  April Walker had routine screening mammography at [facility] 09/14/2013. This showed a possible mass in the left breast. On 10/11/2013 she underwent left diagnostic mammography and ultrasonography which confirmed an irregular mass in the upper outer left breast measuring up to 3 cm. This was firm and fixed at the 2:30 o'clock position, 6 cm from the nipple. Ultrasound showed an irregular hypoechoic mass measuring 2.8 cm. The left axilla showed no evidence of adenopathy.  On 10/11/2013 the patient underwent left breast biopsy, with the pathology [facility] [phone]) showing an invasive ductal carcinoma, high-grade, estrogen receptor 100% positive, progesterone receptor 43% positive, both with strong staining intensity, with an MIB-1 of 87% and HER-2 amplified, the signals ratio being 2.72, the number per cell 3.40.  On 10/23/2013 the patient underwent bilateral breast MRI. This showed the breast and position to be density be. In the left breast there was a 3 cm irregular enhancing mass in the upper outer quadrant. There was a 9 mm oval mass in the inner lower left breast with no definite fatty hilum. There was a 1.4 cm upper left axillary lymph node with a thickened cortex. There were no other abnormal appearing lymph nodes. Ultrasound of the left breast 10/30/2013 found of the left axillary lymph node with the mildly asymmetrical cortex, and a 7 mm her mass in the 7:30 o'clock position of the left breast. Biopsy of both these suspicious areas was performed 10/30/2013, and the preliminary report is  that they're both benign.  The patient's subsequent history is as detailed below  INTERVAL HISTORY: April Walker returns today for follow-up and treatment of her estrogen receptor positive breast cancer. She continues on anastrozole, with good tolerance. She has occasional hot flashes that are usually at night. She denies issues with increased vaginal wetness or dryness.    Since her last visit, she underwent diagnostic bilateral mammography with CAD and tomography on 09/30/2017 at [place] showing: breast density category B. There was no evidence of malignancy. Stable, probably benign left breast calcifications.   REVIEW OF SYSTEMS: April Walker reports that she walks her rescue dogs, Peanut the chiwawa and Charlie the boxer. She also goes to fitness classes at her church. She does yoga, aerobics zumba, and piliates. She sleeps okay, but she has been more tired lately. She denies unusual headaches, visual changes, nausea, vomiting, or dizziness. There has been no unusual cough, phlegm production, or pleurisy. This been no change in bowel or bladder habits. She denies unexplained fatigue or unexplained weight loss, bleeding, rash, or fever. A detailed review of systems was otherwise stable.    PAST MEDICAL HISTORY: Past Medical History:  Diagnosis Date  . Abnormal cholesterol test   . Allergy    seasonal -  . Arthritis   . Cancer [facility]) 2015   breast- left -chemo and radiation s/p lumpectomy  . Cataract    removed both eyes  . Diverticulosis   . GERD (gastroesophageal reflux disease)   . Hearing deficit    no hearing aids  . History of colon polyps 12/09/2010  . Hyperlipidemia   . Hypertension  mild   . Internal hemorrhoids   . Knee pain, left   . Personal history of chemotherapy   . Radiation 05/02/14-06/19/14   Left Breast  . Tubulovillous adenoma of colon   . Wears glasses     PAST SURGICAL HISTORY: Past Surgical History:  Procedure Laterality Date  . BREAST LUMPECTOMY Left     2015  . CATARACT EXTRACTION, BILATERAL  04/2014   bilateral dr Katy Fitch   . COLONOSCOPY  2002   hemorrhoids  . COLONOSCOPY W/ POLYPECTOMY  12/09/2010   1 cm sigmoid polyp - , diverticulosis, hemorrhoids  . HEMORRHOID SURGERY    . KNEE SURGERY    . KNEE SURGERY  2012   Left knee arthroscopy  . PARTIAL MASTECTOMY WITH NEEDLE LOCALIZATION AND AXILLARY SENTINEL LYMPH NODE BX Left 03/21/14   Dr.Faera Byerly  . POLYPECTOMY    . PORT-A-CATH REMOVAL Left 01/01/2015   Procedure: REMOVAL PORT-A-CATH;  Surgeon: Stark Klein, MD;  Location: [place];  Service: General;  Laterality: Left;  . PORTACATH PLACEMENT Left 11/17/2013   Procedure: INSERTION PORT-A-CATH;  Surgeon: Stark Klein, MD;  Location: [place];  Service: General;  Laterality: Left;  . right knee arthroscopy  2011 Dr. Aline Brochure  . TONSILLECTOMY AND ADENOIDECTOMY    . TOTAL KNEE ARTHROPLASTY  11/23/2011   Procedure: TOTAL KNEE ARTHROPLASTY;  Surgeon: Carole Civil, MD;  Location: [facility];  Service: Orthopedics;  Laterality: Left;  . TREATMENT FISTULA ANAL    . UPPER GASTROINTESTINAL ENDOSCOPY      FAMILY HISTORY Family History  Adopted: Yes  Problem Relation Age of Onset  . Stroke Mother   . Heart attack Father    the patient is adopted and has no information on her biological family. the patient's adoptive father died at the age of 19 from a myocardial infarction; productive mother died from a stroke at age 62. The patient does not know of any siblings  GYNECOLOGIC HISTORY: (Reviewed 12/18/2013) Menarche age 67. The patient is GX P0. She underwent menopause in her early 23s and took hormone replacement until 2010.  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 She is single, and lives at home with her  [place] Peanuts and her boxer Evlyn Clines, as well as her cats Patches and [name]    ADVANCED DIRECTIVES: Not in place   HEALTH  MAINTENANCE:  (Reviewed 12/18/2013) Social History   Tobacco Use  . Smoking status: Never Smoker  . Smokeless tobacco: Never Used  Substance Use Topics  . Alcohol use: Yes    Comment: socially  . Drug use: No     Colonoscopy: OCT 2017 Carlean Purl  PAP: Stann Mainland  Bone density: SEPT 2018  Lipid panel: not on file   Allergies  Allergen Reactions  . Sulfonamide Derivatives Anaphylaxis and Hives    Pt. Stated, "large welps on neck immediately."  . Triprolidine-Pseudoephedrine Other (See Comments)    This is Actifed. Increased heart rate to tachycardia.    Current Outpatient Medications  Medication Sig Dispense Refill  . acetaminophen (TYLENOL) 500 MG tablet Take 500 mg by mouth as needed.    Marland Kitchen anastrozole (ARIMIDEX) 1 MG tablet Take 1 tablet (1 mg total) by mouth daily. 90 tablet 3  . loratadine (CLARITIN) 10 MG tablet Take 10 mg by mouth daily.    Marland Kitchen losartan-hydrochlorothiazide (HYZAAR) 100-25 MG tablet Take 1 tablet by mouth daily.  12  .  omeprazole (PRILOSEC) 20 MG capsule Take 20 mg by mouth as needed.    . simvastatin (ZOCOR) 10 MG tablet Take 10 mg by mouth daily. Pt TAKES AT BEDTIME     Current Facility-Administered Medications  Medication Dose Route Frequency Provider Last Rate Last Dose  . 0.9 %  sodium chloride infusion  500 mL Intravenous Continuous Gatha Mayer, MD        OBJECTIVE: Middle-aged white woman in no acute distress  Vitals:   10/28/17 1344  BP: 107/61  Pulse: 71  Resp: 18  Temp: 98.7 F (37.1 C)  SpO2: 100%     Body mass index is 32.84 kg/m.    ECOG FS:1 - Symptomatic but completely ambulatory Filed Weights   10/28/17 1344  Weight: 216 lb (98 kg)   Sclerae unicteric, pupils round and equal No cervical or supraclavicular adenopathy Lungs no rales or rhonchi Heart regular rate and rhythm Abd soft, obese, nontender, positive bowel sounds MSK no focal spinal tenderness, no upper extremity lymphedema Neuro: nonfocal, well oriented, appropriate  affect Breasts: The right breast is unremarkable.  On the left the patient has undergone lumpectomy followed by radiation.  There is no evidence of local recurrence.  Both axillae are benign.  LAB RESULTS:   Lab Results  Component Value Date   WBC 7.1 10/28/2017   NEUTROABS 4.1 10/28/2017   HGB 12.4 10/28/2017   HCT 38.3 10/28/2017   MCV 83.6 10/28/2017   PLT 208 10/28/2017      Chemistry      Component Value Date/Time   NA 144 03/04/2017 1347   K 3.2 (L) 03/04/2017 1347   CL 108 03/21/2014 0801   CO2 31 (H) 03/04/2017 1347   BUN 14.9 03/04/2017 1347   CREATININE 0.8 03/04/2017 1347      Component Value Date/Time   CALCIUM 9.8 03/04/2017 1347   ALKPHOS 106 03/04/2017 1347   AST 16 03/04/2017 1347   ALT 13 03/04/2017 1347   BILITOT 0.71 03/04/2017 1347       STUDIES: Since her last visit, she underwent diagnostic bilateral mammography with CAD and tomography on 09/30/2017 at [place] showing: breast density category B. There was no evidence of malignancy. Stable, probably benign left breast calcifications.   ASSESSMENT: 67 y.o. [place] woman status post left breast biopsy 10/11/2013 for a clinical T2 N0, stage IIA invasive ductal carcinoma, grade 3, estrogen and progesterone receptor positive, with an MIB-1 of 87%, and HER-2 amplified  (a) additional Left breast and left axillary node biopsies 10/30/2013 benign  (1) started neoadjuvant therapy 11/20/2013 , consisting of carboplatin, docetaxel, trastuzumab and pertuzumab every 3 weeks x6 completed 03/05/2014   (2) trastuzumab alone to continued to complete a year; last dose 11/26/2014. Final echocardiogram 11/27/2014 shows a well preserved ejection fraction.  (3) Patient underwent left lumpectomy with sentinel node biopsy on 03/21/14. Pathology results demonstrated a complete response, pT0 pN0  (4) adjuvant radiation to the left breast 61 Gy completed 06/19/14  (5) anastrozole started 07/06/14   (a) DEXA  scan 07/03/2014 was normal, with the lowest T score -1.0  (b) DEXA scan 04/01/2017 showed a T score of -1 point  PLAN: Lovetta will soon be for years out from definitive surgery for her breast cancer with no evidence of disease recurrence.  This is very favorable.  She is a little over 3 years into her 5 planned years of anastrozole.  She tolerates this well.  We reviewed her DEXA scan which shows minimal  change as compared to 2015.  She is very close to normal.  Accordingly I recommend that she continue to walk her dogs and exercise at church.  She will return to see me in 1 year after her next mammogram.  She knows to call for any other issues that may develop before that visit.     Magrinat, Virgie Dad, MD  10/28/17 1:57 PM Medical Oncology and Hematology [facility] [address] [place], [place] [address] Tel. [phone]    Fax. [phone]  This document serves as a record of services personally performed by Lurline Del, MD. It was created on his behalf by Sheron Nightingale, a trained medical scribe. The creation of this record is based on the scribe's personal observations and the provider's statements to them.   I have reviewed the above documentation for accuracy and completeness, and I agree with the above.

## 2017-10-28 ENCOUNTER — Telehealth: Payer: Self-pay | Admitting: Oncology

## 2017-10-28 ENCOUNTER — Inpatient Hospital Stay: Payer: Medicare Other

## 2017-10-28 ENCOUNTER — Inpatient Hospital Stay: Payer: Medicare Other | Attending: Oncology | Admitting: Oncology

## 2017-10-28 VITALS — BP 107/61 | HR 71 | Temp 98.7°F | Resp 18 | Ht 68.0 in | Wt 216.0 lb

## 2017-10-28 DIAGNOSIS — Z79899 Other long term (current) drug therapy: Secondary | ICD-10-CM | POA: Insufficient documentation

## 2017-10-28 DIAGNOSIS — Z9221 Personal history of antineoplastic chemotherapy: Secondary | ICD-10-CM

## 2017-10-28 DIAGNOSIS — M1990 Unspecified osteoarthritis, unspecified site: Secondary | ICD-10-CM

## 2017-10-28 DIAGNOSIS — R232 Flushing: Secondary | ICD-10-CM | POA: Diagnosis not present

## 2017-10-28 DIAGNOSIS — Z170 Estrogen receptor positive status [ER+]: Secondary | ICD-10-CM | POA: Diagnosis not present

## 2017-10-28 DIAGNOSIS — I10 Essential (primary) hypertension: Secondary | ICD-10-CM | POA: Diagnosis not present

## 2017-10-28 DIAGNOSIS — Z79811 Long term (current) use of aromatase inhibitors: Secondary | ICD-10-CM | POA: Diagnosis not present

## 2017-10-28 DIAGNOSIS — C50412 Malignant neoplasm of upper-outer quadrant of left female breast: Secondary | ICD-10-CM | POA: Diagnosis not present

## 2017-10-28 DIAGNOSIS — Z9225 Personal history of immunosupression therapy: Secondary | ICD-10-CM | POA: Diagnosis not present

## 2017-10-28 DIAGNOSIS — K219 Gastro-esophageal reflux disease without esophagitis: Secondary | ICD-10-CM | POA: Diagnosis not present

## 2017-10-28 DIAGNOSIS — Z923 Personal history of irradiation: Secondary | ICD-10-CM | POA: Insufficient documentation

## 2017-10-28 DIAGNOSIS — E785 Hyperlipidemia, unspecified: Secondary | ICD-10-CM | POA: Diagnosis not present

## 2017-10-28 LAB — CMP (CANCER CENTER ONLY)
ALT: 12 U/L (ref 0–55)
AST: 15 U/L (ref 5–34)
Albumin: 3.8 g/dL (ref 3.5–5.0)
Alkaline Phosphatase: 112 U/L (ref 40–150)
Anion gap: 9 (ref 3–11)
BUN: 16 mg/dL (ref 7–26)
CHLORIDE: 104 mmol/L (ref 98–109)
CO2: 29 mmol/L (ref 22–29)
CREATININE: 0.82 mg/dL (ref 0.60–1.10)
Calcium: 9.6 mg/dL (ref 8.4–10.4)
GFR, Estimated: >60 mL/min (ref >60)
Glucose, Bld: 122 mg/dL (ref 70–140)
POTASSIUM: 3.1 mmol/L — AB (ref 3.5–5.1)
Sodium: 142 mmol/L (ref 136–145)
TOTAL PROTEIN: 6.9 g/dL (ref 6.4–8.3)
Total Bilirubin: 0.5 mg/dL (ref 0.2–1.2)

## 2017-10-28 LAB — CBC WITH DIFFERENTIAL (CANCER CENTER ONLY)
Basophils Absolute: 0.1 10*3/uL (ref 0.0–0.1)
Basophils Relative: 1 %
EOS PCT: 3 %
Eosinophils Absolute: 0.2 10*3/uL (ref 0.0–0.5)
HCT: 38.3 % (ref 34.8–46.6)
Hemoglobin: 12.4 g/dL (ref 11.6–15.9)
LYMPHS ABS: 2.2 10*3/uL (ref 0.9–3.3)
LYMPHS PCT: 31 %
MCH: 27.1 pg (ref 25.1–34.0)
MCHC: 32.4 g/dL (ref 31.5–36.0)
MCV: 83.6 fL (ref 79.5–101.0)
MONO ABS: 0.6 10*3/uL (ref 0.1–0.9)
Monocytes Relative: 8 %
Neutro Abs: 4.1 10*3/uL (ref 1.5–6.5)
Neutrophils Relative %: 57 %
PLATELETS: 208 10*3/uL (ref 145–400)
RBC: 4.58 MIL/uL (ref 3.70–5.45)
RDW: 14.6 % — AB (ref 11.2–14.5)
WBC Count: 7.1 10*3/uL (ref 3.9–10.3)

## 2017-10-28 MED ORDER — ANASTROZOLE 1 MG PO TABS: 1.0000 mg | ORAL_TABLET | Freq: Every day | ORAL | 3 refills | Status: DC

## 2017-10-28 NOTE — Telephone Encounter (Signed)
Gave avs and calendar ° °

## 2018-01-17 ENCOUNTER — Ambulatory Visit: Payer: Medicare Other | Admitting: Orthopedic Surgery

## 2018-01-17 ENCOUNTER — Ambulatory Visit (INDEPENDENT_AMBULATORY_CARE_PROVIDER_SITE_OTHER): Payer: Medicare Other

## 2018-01-17 ENCOUNTER — Encounter: Payer: Self-pay | Admitting: Orthopedic Surgery

## 2018-01-17 VITALS — BP 97/59 | HR 78 | Ht 68.0 in | Wt 215.0 lb

## 2018-01-17 DIAGNOSIS — Z96652 Presence of left artificial knee joint: Secondary | ICD-10-CM

## 2018-01-17 NOTE — Progress Notes (Addendum)
ANNUAL FOLLOW UP FOR left TKA   Chief Complaint  Patient presents with  . Follow-up    Left TKA  11/26/11     HPI: The patient is here for the annual  follow-up x-ray for knee replacement. The patient is not complaining of pain weakness instability or stiffness in the repaired knee.  She fell in March fell onto her left knee says she can walk on it so did not seek x-ray at that not having any symptoms at present  Review of Systems  Musculoskeletal:       No pain or tenderness left knee no pain or tenderness right knee    Past Medical History:  Diagnosis Date  . Abnormal cholesterol test   . Allergy    seasonal -  . Arthritis   . Cancer [facility]) 2015   breast- left -chemo and radiation s/p lumpectomy  . Cataract    removed both eyes  . Diverticulosis   . GERD (gastroesophageal reflux disease)   . Hearing deficit    no hearing aids  . History of colon polyps 12/09/2010  . Hyperlipidemia   . Hypertension    mild   . Internal hemorrhoids   . Knee pain, left   . Personal history of chemotherapy   . Radiation 05/02/14-06/19/14   Left Breast  . Tubulovillous adenoma of colon   . Wears glasses      Examination of the left KNEE  BP (!) 97/59   Pulse 78   Ht 5\' 8"  (1.727 m)   Wt 215 lb (97.5 kg)   BMI 32.69 kg/m   General the patient is normally groomed in no distress  Mood normal Affect pleasant   The patient is Awake and alert ; oriented normal   Inspection shows : incision healed nicely without erythema, no tenderness no swelling  Range of motion total range of motion is 120  Stability the knee is stable anterior to posterior as well as medial to lateral  Strength quadriceps strength is normal  Skin no erythema around the skin incision  Cardiovascular NO EDEMA   Neuro: normal sensation in the operative leg   Gait: normal expected gait without cane    Medical decision-making section  X-rays ordered with the following personal interpretation  Normal  alignment without loosening   Diagnosis  Encounter Diagnosis  Name Primary?  . 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

## 2018-08-19 ENCOUNTER — Other Ambulatory Visit: Payer: Self-pay | Admitting: Oncology

## 2018-08-19 DIAGNOSIS — Z9889 Other specified postprocedural states: Secondary | ICD-10-CM

## 2018-10-03 ENCOUNTER — Other Ambulatory Visit: Payer: Self-pay

## 2018-10-03 ENCOUNTER — Ambulatory Visit
Admission: RE | Admit: 2018-10-03 | Discharge: 2018-10-03 | Disposition: A | Discharge status: Home / Self Care | Payer: Medicare Other | Source: Ambulatory Visit | Attending: Oncology | Admitting: Oncology

## 2018-10-03 DIAGNOSIS — Z9889 Other specified postprocedural states: Secondary | ICD-10-CM

## 2018-10-31 ENCOUNTER — Inpatient Hospital Stay: Payer: Medicare Other | Admitting: Oncology

## 2018-10-31 ENCOUNTER — Inpatient Hospital Stay: Payer: Medicare Other

## 2018-11-02 ENCOUNTER — Telehealth: Payer: Self-pay | Admitting: Oncology

## 2018-11-02 NOTE — Telephone Encounter (Signed)
Called regarding upcoming Webex appointment, patient is notified and e-mail sent.

## 2018-11-03 NOTE — Progress Notes (Signed)
[place]  Telephone:(336) [phone] Fax:(336) [phone]     ID: April Walker OB: [birth year]  MR#: [id number]  OJJ#:[id number]  Patient Care Team: Levin Erp, MD as PCP - General (Internal Medicine) Stark Klein, MD as Consulting Physician (General Surgery) , Virgie Dad, MD as Consulting Physician (Oncology) Holley Bouche, NP (Inactive) as Nurse Practitioner (Nurse Practitioner) Gatha Mayer, MD as Consulting Physician (Gastroenterology) Vania Rea, MD as Consulting Physician (Obstetrics and Gynecology) [name], MD as Referring Physician (Radiation Oncology) Carole Civil, MD as Consulting Physician (Orthopedic Surgery) OTHER MD:    CHIEF COMPLAINT: Left Breast Cancer, locally advanced  CURRENT TREATMENT:  anastrozole   I connected with April Walker on 11/04/18 at  9:00 AM EDT by video enabled telemedicine visit and verified that I am speaking with the correct person using two identifiers.   I discussed the limitations, risks, security and privacy concerns of performing an evaluation and management service by telemedicine and the availability of in-person appointments. I also discussed with the patient that there may be a patient responsible charge related to this service. The patient expressed understanding and agreed to proceed.   Other persons participating in the visit and their role in the encounter:   -[place]ma   Patient's location: home  Provider's location: [facility]   Chief Complaint: Left Breast Cancer, locally advanced    BREAST CANCER HISTORY: From the original integument:  April Walker had routine screening mammography at [facility] 09/14/2013. This showed a possible mass in the left breast. On 10/11/2013 she underwent left diagnostic mammography and ultrasonography which confirmed an irregular mass in the upper outer left breast measuring up to 3 cm. This was firm and  fixed at the 2:30 o'clock position, 6 cm from the nipple. Ultrasound showed an irregular hypoechoic mass measuring 2.8 cm. The left axilla showed no evidence of adenopathy.  On 10/11/2013 the patient underwent left breast biopsy, with the pathology [facility] [phone]) showing an invasive ductal carcinoma, high-grade, estrogen receptor 100% positive, progesterone receptor 43% positive, both with strong staining intensity, with an MIB-1 of 87% and HER-2 amplified, the signals ratio being 2.72, the number per cell 3.40.  On 10/23/2013 the patient underwent bilateral breast MRI. This showed the breast and position to be density be. In the left breast there was a 3 cm irregular enhancing mass in the upper outer quadrant. There was a 9 mm oval mass in the inner lower left breast with no definite fatty hilum. There was a 1.4 cm upper left axillary lymph node with a thickened cortex. There were no other abnormal appearing lymph nodes. Ultrasound of the left breast 10/30/2013 found of the left axillary lymph node with the mildly asymmetrical cortex, and a 7 mm her mass in the 7:30 o'clock position of the left breast. Biopsy of both these suspicious areas was performed 10/30/2013, and the preliminary report is that they're both benign.  The patient's subsequent history is as detailed below   INTERVAL HISTORY: April Walker was seen today for follow-up and treatment of her estrogen receptor positive breast cancer.   She continues on anastrozole. She has some occasional hot flashes at night; she has not taken any medications to treat.  April Walker's last bone density screening on 04/01/2017, showed a T-score of -1.2, which is considered osteopenic.    She had bilateral diagnostic mammography with tomography 10/03/2018 finding a breast density category B.  There was no evidence of malignancy.   REVIEW  OF SYSTEMS: April Walker has been staying busy amid the COVID-19 pandemic. She has been cleaning and getting things done that she . She  notes some post nasal drip with some coughing from the pollen that comes and goes; she takes Claritin to treat, with good relief. For exercise, she gets out to walk her dogs, usually a mile or two at a time.   The patient denies unusual headaches, visual changes, nausea, vomiting, or dizziness. There has been no unusual cough, phlegm production, or pleurisy. This been no change in bowel or bladder habits. The patient denies unexplained fatigue or unexplained weight loss, bleeding, rash, or fever. A detailed review of systems was otherwise noncontributory.    PAST MEDICAL HISTORY: Past Medical History:  Diagnosis Date  . Abnormal cholesterol test   . Allergy    seasonal -  . Arthritis   . Cancer [facility]) 2015   breast- left -chemo and radiation s/p lumpectomy  . Cataract    removed both eyes  . Diverticulosis   . GERD (gastroesophageal reflux disease)   . Hearing deficit    no hearing aids  . History of colon polyps 12/09/2010  . Hyperlipidemia   . Hypertension    mild   . Internal hemorrhoids   . Knee pain, left   . Personal history of chemotherapy   . Radiation 05/02/14-06/19/14   Left Breast  . Tubulovillous adenoma of colon   . Wears glasses     PAST SURGICAL HISTORY: Past Surgical History:  Procedure Laterality Date  . BREAST EXCISIONAL BIOPSY Right   . BREAST LUMPECTOMY Left    2015  . CATARACT EXTRACTION, BILATERAL  04/2014   bilateral dr Katy Fitch   . COLONOSCOPY  2002   hemorrhoids  . COLONOSCOPY W/ POLYPECTOMY  12/09/2010   1 cm sigmoid polyp - , diverticulosis, hemorrhoids  . HEMORRHOID SURGERY    . KNEE SURGERY    . KNEE SURGERY  2012   Left knee arthroscopy  . PARTIAL MASTECTOMY WITH NEEDLE LOCALIZATION AND AXILLARY SENTINEL LYMPH NODE BX Left 03/21/14   Dr.Faera Byerly  . POLYPECTOMY    . PORT-A-CATH REMOVAL Left 01/01/2015   Procedure: REMOVAL PORT-A-CATH;  Surgeon: Stark Klein, MD;  Location: [place];  Service: General;  Laterality: Left;  .  PORTACATH PLACEMENT Left 11/17/2013   Procedure: INSERTION PORT-A-CATH;  Surgeon: Stark Klein, MD;  Location: [place];  Service: General;  Laterality: Left;  . right knee arthroscopy  2011 Dr. Aline Brochure  . TONSILLECTOMY AND ADENOIDECTOMY    . TOTAL KNEE ARTHROPLASTY  11/23/2011   Procedure: TOTAL KNEE ARTHROPLASTY;  Surgeon: Carole Civil, MD;  Location: [facility];  Service: Orthopedics;  Laterality: Left;  . TREATMENT FISTULA ANAL    . UPPER GASTROINTESTINAL ENDOSCOPY      FAMILY HISTORY Family History  Adopted: Yes  Problem Relation Age of Onset  . Stroke Mother   . Heart attack Father     The patient is adopted and has no information on her biological family. the patient's adoptive father died at the age of 60 from a myocardial infarction; productive mother died from a stroke at age 58. The patient does not know of any siblings   GYNECOLOGIC HISTORY: (Reviewed 12/18/2013) Menarche age 9. The patient is GX P0. She underwent menopause in her early 30s and took hormone replacement until 2010.   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 She is single, and lives at home with her  [place]aur[place]ia Peanuts and her boxer Evlyn Clines, as well as her cats Patches and [name]    ADVANCED DIRECTIVES: Not in place   HEALTH MAINTENANCE:  (Reviewed 12/18/2013) Social History   Tobacco Use  . Smoking status: Never Smoker  . Smokeless tobacco: Never Used  Substance Use Topics  . Alcohol use: Yes    Comment: socially  . Drug use: No     Colonoscopy: OCT 2017 Carlean Purl  PAP: Stann Mainland  Bone density: SEPT 2018  Lipid panel: not on file   Allergies  Allergen Reactions  . Sulfonamide Derivatives Anaphylaxis and Hives    Pt. Stated, "large welps on neck immediately."  . Triprolidine-Pseudoephedrine Other (See Comments)    This is Actifed. Increased heart rate to tachycardia.    Current Outpatient Medications   Medication Sig Dispense Refill  . acetaminophen (TYLENOL) 500 MG tablet Take 500 mg by mouth as needed.    Marland Kitchen anastrozole (ARIMIDEX) 1 MG tablet Take 1 tablet (1 mg total) by mouth daily. 90 tablet 3  . loratadine (CLARITIN) 10 MG tablet Take 10 mg by mouth daily.    Marland Kitchen losartan-hydrochlorothiazide (HYZAAR) 100-25 MG tablet Take 1 tablet by mouth daily.  12  . omeprazole (PRILOSEC) 20 MG capsule Take 20 mg by mouth as needed.    . simvastatin (ZOCOR) 10 MG tablet Take 10 mg by mouth daily. Pt TAKES AT BEDTIME     Current Facility-Administered Medications  Medication Dose Route Frequency Provider Last Rate Last Dose  . 0.9 %  sodium chloride infusion  500 mL Intravenous Continuous Gatha Mayer, MD        OBJECTIVE: Middle-aged white woman in no acute distress  There were no vitals filed for this visit.   There is no height or weight on file to calculate BMI.    ECOG FS:1 - Symptomatic but completely ambulatory There were no vitals filed for this visit.    LAB RESULTS:  Lab Results  Component Value Date   WBC 7.1 10/28/2017   NEUTROABS 4.1 10/28/2017   HGB 12.4 10/28/2017   HCT 38.3 10/28/2017   MCV 83.6 10/28/2017   PLT 208 10/28/2017      Chemistry      Component Value Date/Time   NA 142 10/28/2017 1335   NA 144 03/04/2017 1347   K 3.1 (L) 10/28/2017 1335   K 3.2 (L) 03/04/2017 1347   CL 104 10/28/2017 1335   CO2 29 10/28/2017 1335   CO2 31 (H) 03/04/2017 1347   BUN 16 10/28/2017 1335   BUN 14.9 03/04/2017 1347   CREATININE 0.82 10/28/2017 1335   CREATININE 0.8 03/04/2017 1347      Component Value Date/Time   CALCIUM 9.6 10/28/2017 1335   CALCIUM 9.8 03/04/2017 1347   ALKPHOS 112 10/28/2017 1335   ALKPHOS 106 03/04/2017 1347   AST 15 10/28/2017 1335   AST 16 03/04/2017 1347   ALT 12 10/28/2017 1335   ALT 13 03/04/2017 1347   BILITOT 0.5 10/28/2017 1335   BILITOT 0.71 03/04/2017 1347       STUDIES: She will be due for repeat bone density September  2020   ASSESSMENT: 68 y.o. [place] woman status post left breast biopsy 10/11/2013 for a clinical T2 N0, stage IIA invasive ductal carcinoma, grade 3, estrogen and progesterone receptor positive, with an MIB-1 of 87%, and HER-2 amplified  (a) additional Left breast and left axillary node biopsies 10/30/2013 benign  (  1) started neoadjuvant therapy 11/20/2013 , consisting of carboplatin, docetaxel, trastuzumab and pertuzumab every 3 weeks x6 completed 03/05/2014   (2) trastuzumab alone to continued to complete a year; last dose 11/26/2014. Final echocardiogram 11/27/2014 shows a well preserved ejection fraction.  (3) Patient underwent left lumpectomy with sentinel node biopsy on 03/21/14. Pathology results demonstrated a complete response, pT0 pN0  (4) adjuvant radiation to the left breast 61 Gy completed 06/19/14  (5) anastrozole started 07/06/14   (a) DEXA scan 07/03/2014 was normal, with the lowest T score -1.0  (b) DEXA scan 04/01/2017 showed a T score of -1 point   PLAN: April Walker is now 4-1/2 years out from definitive surgery with no evidence of disease recurrence.  This is very favorable.  She is tolerating anastrozole well and the plan will be to continue that a total of 5 years.  This means she will "graduate" when she next sees me January 2021.  She will be due for repeat bone density October of this year and I put the order in for that  She knows to call for any other issues that may develop before the next visit. , Virgie Dad, MD  11/04/18 8:46 AM Medical Oncology and Hematology [facility] [address] [place], [place] [address] Tel. [phone]    Fax. [phone]  I, Jacqualyn Posey am acting as a Education administrator for Chauncey Cruel, MD.   I, Lurline Del MD, have reviewed the above documentation for accuracy and completeness, and I agree with the above.

## 2018-11-04 ENCOUNTER — Inpatient Hospital Stay: Payer: Medicare Other | Attending: Oncology | Admitting: Oncology

## 2018-11-04 DIAGNOSIS — Z882 Allergy status to sulfonamides status: Secondary | ICD-10-CM

## 2018-11-04 DIAGNOSIS — Z79899 Other long term (current) drug therapy: Secondary | ICD-10-CM | POA: Diagnosis not present

## 2018-11-04 DIAGNOSIS — M8580 Other specified disorders of bone density and structure, unspecified site: Secondary | ICD-10-CM

## 2018-11-04 DIAGNOSIS — G620 Drug-induced polyneuropathy: Secondary | ICD-10-CM

## 2018-11-04 DIAGNOSIS — T451X5A Adverse effect of antineoplastic and immunosuppressive drugs, initial encounter: Secondary | ICD-10-CM

## 2018-11-04 DIAGNOSIS — C50412 Malignant neoplasm of upper-outer quadrant of left female breast: Secondary | ICD-10-CM | POA: Diagnosis not present

## 2018-11-04 DIAGNOSIS — Z170 Estrogen receptor positive status [ER+]: Secondary | ICD-10-CM | POA: Diagnosis not present

## 2018-11-04 DIAGNOSIS — Z888 Allergy status to other drugs, medicaments and biological substances status: Secondary | ICD-10-CM

## 2018-11-04 MED ORDER — DEXAMETHASONE 4 MG PO TABS: 4.0000 mg | ORAL_TABLET | Freq: Every day | ORAL | 4 refills | Status: DC

## 2018-11-04 MED ORDER — ANASTROZOLE 1 MG PO TABS
1.0000 mg | ORAL_TABLET | Freq: Every day | ORAL | 3 refills | Status: DC
Start: 2018-11-04 — End: 2019-07-24

## 2019-05-22 ENCOUNTER — Ambulatory Visit
Admission: RE | Admit: 2019-05-22 | Discharge: 2019-05-22 | Disposition: A | Discharge status: Home / Self Care | Payer: Medicare Other | Source: Ambulatory Visit | Attending: Oncology | Admitting: Oncology

## 2019-05-22 ENCOUNTER — Other Ambulatory Visit: Payer: Self-pay

## 2019-05-22 DIAGNOSIS — C50412 Malignant neoplasm of upper-outer quadrant of left female breast: Secondary | ICD-10-CM

## 2019-05-22 DIAGNOSIS — G620 Drug-induced polyneuropathy: Secondary | ICD-10-CM

## 2019-05-22 DIAGNOSIS — Z170 Estrogen receptor positive status [ER+]: Secondary | ICD-10-CM

## 2019-07-23 NOTE — Progress Notes (Signed)
[place]  Telephone:([phone]) [phone] Fax:(336) [phone]     ID: Ruthell Feigenbaum OB: [birth year]  MR#: [id number]  NID#:[id number]  Patient Care Team: Michael Boston, MD as PCP - General (Internal Medicine) Stark Klein, MD as Consulting Physician (General Surgery) Charmayne Odell, Virgie Dad, MD as Consulting Physician (Oncology) Holley Bouche, NP (Inactive) as Nurse Practitioner (Nurse Practitioner) Gatha Mayer, MD as Consulting Physician (Gastroenterology) Vania Rea, MD as Consulting Physician (Obstetrics and Gynecology) [name], MD as Referring Physician (Radiation Oncology) Carole Civil, MD as Consulting Physician (Orthopedic Surgery) OTHER MD:    CHIEF COMPLAINT: Left Breast Cancer, locally advanced  CURRENT TREATMENT: Completing 5 years anastrozole   INTERVAL HISTORY: Faun was seen today for follow-up of her estrogen receptor positive breast cancer.   She continues on anastrozole.  She has tolerated this remarkably well.  She has occasional hot flashes, no significant vaginal dryness and never had any arthralgias or myalgias issues.  Since her last visit, she underwent bone density screening at the breast center on 05/22/2019. This showed a T-score of -1.5, which is considered osteopenic.  Her most recent mammography on 10/03/2018 at [facility] [place] showed breast density category B and no evidence of malignancy.   REVIEW OF SYSTEMS: Alyria exercises mostly by walking her dogs and doing some line dancing.  She is being very careful regarding the pandemic.  She was recently started on amlodipine and is tolerating that well.  She has been referred for sleep apnea study but she really does not think she has it.  Aside from these issues a detailed review of systems today was noncontributory   BREAST CANCER HISTORY: From the original integument:  Shanigua had routine screening mammography at [facility] 09/14/2013. This showed a  possible mass in the left breast. On 10/11/2013 she underwent left diagnostic mammography and ultrasonography which confirmed an irregular mass in the upper outer left breast measuring up to 3 cm. This was firm and fixed at the 2:30 o'clock position, 6 cm from the nipple. Ultrasound showed an irregular hypoechoic mass measuring 2.8 cm. The left axilla showed no evidence of adenopathy.  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  On 10/23/2013 the patient underwent bilateral breast MRI. This showed the breast and position to be density be. In the left breast there was a 3 cm irregular enhancing mass in the upper outer quadrant. There was a 9 mm oval mass in the inner lower left breast with no definite fatty hilum. There was a 1.4 cm upper left axillary lymph node with a thickened cortex. There were no other abnormal appearing lymph nodes. Ultrasound of the left breast 10/30/2013 found of the left axillary lymph node with the mildly asymmetrical cortex, and a 7 mm her mass in the 7:30 o'clock position of the left breast. Biopsy of both these suspicious areas was performed 10/30/2013, and the preliminary report is that they're both benign.  The patient's subsequent history is as detailed below   PAST MEDICAL HISTORY: Past Medical History:  Diagnosis Date  . Abnormal cholesterol test   . Allergy    seasonal -  . Arthritis   . Cancer [facility]) 2015   breast- left -chemo and radiation s/p lumpectomy  .  Cataract    removed both eyes  . Diverticulosis   . GERD (gastroesophageal reflux disease)   . Hearing deficit    no hearing aids  . History of colon polyps 12/09/2010  . Hyperlipidemia   . Hypertension    mild    . Internal hemorrhoids   . Knee pain, left   . Personal history of chemotherapy   . Radiation 05/02/14-06/19/14   Left Breast  . Tubulovillous adenoma of colon   . Wears glasses     PAST SURGICAL HISTORY: Past Surgical History:  Procedure Laterality Date  . BREAST EXCISIONAL BIOPSY Right   . BREAST LUMPECTOMY Left    2015  . CATARACT EXTRACTION, BILATERAL  04/2014   bilateral dr Katy Fitch   . COLONOSCOPY  2002   hemorrhoids  . COLONOSCOPY W/ POLYPECTOMY  12/09/2010   1 cm sigmoid polyp - , diverticulosis, hemorrhoids  . HEMORRHOID SURGERY    . KNEE SURGERY    . KNEE SURGERY  2012   Left knee arthroscopy  . PARTIAL MASTECTOMY WITH NEEDLE LOCALIZATION AND AXILLARY SENTINEL LYMPH NODE BX Left 03/21/14   Dr.Faera Byerly  . POLYPECTOMY    . PORT-A-CATH REMOVAL Left 01/01/2015   Procedure: REMOVAL PORT-A-CATH;  Surgeon: Stark Klein, MD;  Location: [place];  Service: General;  Laterality: Left;  . PORTACATH PLACEMENT Left 11/17/2013   Procedure: INSERTION PORT-A-CATH;  Surgeon: Stark Klein, MD;  Location: [place];  Service: General;  Laterality: Left;  . right knee arthroscopy  2011 Dr. Aline Brochure  . TONSILLECTOMY AND ADENOIDECTOMY    . TOTAL KNEE ARTHROPLASTY  11/23/2011   Procedure: TOTAL KNEE ARTHROPLASTY;  Surgeon: Carole Civil, MD;  [facility];  Service: Orthopedics;  Laterality: Left;  . TREATMENT FISTULA ANAL    . UPPER GASTROINTESTINAL ENDOSCOPY      FAMILY HISTORY Family History  Adopted: Yes  Problem Relation Age of Onset  . Stroke Mother   . Heart attack Father     The patient is adopted and has no information on her biological family. The patient's adoptive father died at the age of 5 from a myocardial infarction; adoptive mother died from a stroke at age 56. The patient does not know of any siblings   GYNECOLOGIC HISTORY: (Reviewed 12/18/2013) Menarche age 44. The patient is GX P0. She underwent menopause in her early  73s and took hormone replacement until 2010.   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 She is single, and lives at home with her [place] Peanuts and her boxer Evlyn Clines, as well as her cats Patches and [name]    ADVANCED DIRECTIVES: Not in place   HEALTH MAINTENANCE:   Social History   Tobacco Use  . Smoking status: Never Smoker  . Smokeless tobacco: Never Used  Substance Use Topics  . Alcohol use: Yes    Comment: socially  . Drug use: No     Colonoscopy: OCT 2017 Carlean Purl  PAP: [facility]  Bone density: SEPT 2018  Lipid panel: not on file   Allergies  Allergen Reactions  . Sulfonamide Derivatives Anaphylaxis and Hives    Pt. Stated, "large welps on neck immediately."  . Triprolidine-Pseudoephedrine Other (See Comments)    This is Actifed. Increased heart rate to tachycardia.    Current Outpatient Medications  Medication Sig Dispense Refill  . acetaminophen (TYLENOL) 500 MG tablet Take 500 mg by mouth as needed.    Marland Kitchen  anastrozole (ARIMIDEX) 1 MG tablet Take 1 tablet (1 mg total) by mouth daily. 90 tablet 3  . loratadine (CLARITIN) 10 MG tablet Take 10 mg by mouth daily.    Marland Kitchen losartan-hydrochlorothiazide (HYZAAR) 100-25 MG tablet Take 1 tablet by mouth daily.  12  . omeprazole (PRILOSEC) 20 MG capsule Take 20 mg by mouth as needed.    . simvastatin (ZOCOR) 10 MG tablet Take 10 mg by mouth daily. Pt TAKES AT BEDTIME     Current Facility-Administered Medications  Medication Dose Route Frequency Provider Last Rate Last Admin  . 0.9 %  sodium chloride infusion  500 mL Intravenous Continuous Gatha Mayer, MD        OBJECTIVE: Middle-aged white woman in no acute distress  Vitals:   07/24/19 0922  BP: (!) 123/55  Pulse: 79  Resp: 18  Temp: 98 F (36.7 C)  SpO2: 98%     Body mass index is 34.03 kg/m.    ECOG FS:1 - Symptomatic but completely ambulatory Filed Weights   07/24/19 0922  Weight: 223 lb  12.8 oz (101.5 kg)    Sclerae unicteric, EOMs intact Wearing a mask No cervical or supraclavicular adenopathy Lungs no rales or rhonchi Heart regular rate and rhythm Abd soft, obese, nontender, positive bowel sounds MSK no focal spinal tenderness, no upper extremity lymphedema Neuro: nonfocal, well oriented, appropriate affect Breasts: The right breast is benign.  The left breast is status post lumpectomy and radiation.  There is no evidence of local recurrence.  Both axillae are benign.   LAB RESULTS:  Lab Results  Component Value Date   WBC 7.6 07/24/2019   NEUTROABS 4.9 07/24/2019   HGB 11.9 (L) 07/24/2019   HCT 37.1 07/24/2019   MCV 81.7 07/24/2019   PLT 265 07/24/2019      Chemistry      Component Value Date/Time   NA 142 10/28/2017 1335   NA 144 03/04/2017 1347   K 3.1 (L) 10/28/2017 1335   K 3.2 (L) 03/04/2017 1347   CL 104 10/28/2017 1335   CO2 29 10/28/2017 1335   CO2 31 (H) 03/04/2017 1347   BUN 16 10/28/2017 1335   BUN 14.9 03/04/2017 1347   CREATININE 0.82 10/28/2017 1335   CREATININE 0.8 03/04/2017 1347      Component Value Date/Time   CALCIUM 9.6 10/28/2017 1335   CALCIUM 9.8 03/04/2017 1347   ALKPHOS 112 10/28/2017 1335   ALKPHOS 106 03/04/2017 1347   AST 15 10/28/2017 1335   AST 16 03/04/2017 1347   ALT 12 10/28/2017 1335   ALT 13 03/04/2017 1347   BILITOT 0.5 10/28/2017 1335   BILITOT 0.71 03/04/2017 1347       STUDIES: No results found.    ASSESSMENT: 69 y.o. [place] woman status post left breast biopsy 10/11/2013 for a clinical T2 N0, stage IIA invasive ductal carcinoma, grade 3, estrogen and progesterone receptor positive, with an MIB-1 of 87%, and HER-2 amplified  (a) additional Left breast and left axillary node biopsies 10/30/2013 benign  (1) started neoadjuvant therapy 11/20/2013 , consisting of carboplatin, docetaxel, trastuzumab and pertuzumab every 3 weeks x6 completed 03/05/2014   (2) trastuzumab alone to continued to  complete a year; last dose 11/26/2014. Final echocardiogram 11/27/2014 shows a well preserved ejection fraction.  (3) Patient underwent left lumpectomy with sentinel node biopsy on 03/21/14. Pathology results demonstrated a complete response, pT0 pN0  (4) adjuvant radiation to the left breast 61 Gy completed 06/19/14  (5) anastrozole started 07/06/14   (  a) DEXA scan 07/03/2014 was normal, with the lowest T score -1.0  (b) DEXA scan 04/01/2017 showed a T score of -1 point  (c) DEXA scan 05/22/2019 showed a T score of -1.5   PLAN: Olympia is now 5 and half years out from definitive surgery for her breast cancer with no evidence of disease recurrence.  This is very favorable.  She has tolerated anastrozole well.  She will complete 5 years by the end of this month.  She will stop anastrozole at that time.  She understands there is no need to taper to off.  We discussed her prognosis which is unusually good since she had a complete response to neoadjuvant chemotherapy.  Nevertheless there is a small risk of recurrence and also a small risk of a new breast cancer developing in either breast in the future.  As far as breast cancer follow-up is concerned all she needs is her yearly screening mammography and her yearly physician breast exam.  I gave her information on our survivorship program.  We also discussed pandemic precautions.  I will be glad to see Debhora again at any time in the future if and when the need arises but as of now are making no further routine appointments for her here.  Total encounter time 20 minutes.*   Laqueshia Cihlar, Virgie Dad, MD  07/24/19 9:37 AM Medical Oncology and Hematology [facility] [place], [place] [address] Tel. [phone]    Fax. [phone]   I, Wilburn Mylar, am acting as scribe for Dr. Virgie Dad. Jannelle Notaro.  I, Lurline Del MD, have reviewed the above documentation for accuracy and completeness, and I agree with the  above.    *Total Encounter Time as defined by the Centers for Medicare and Medicaid Services includes, in addition to the face-to-face time of a patient visit (documented in the note above) non-face-to-face time: obtaining and reviewing outside history, ordering and reviewing medications, tests or procedures, care coordination (communications with other health care professionals or caregivers) and documentation in the medical record.

## 2019-07-24 ENCOUNTER — Telehealth: Payer: Self-pay

## 2019-07-24 ENCOUNTER — Other Ambulatory Visit: Payer: Self-pay

## 2019-07-24 ENCOUNTER — Inpatient Hospital Stay: Payer: Medicare Other | Attending: Oncology | Admitting: Oncology

## 2019-07-24 ENCOUNTER — Inpatient Hospital Stay: Payer: Medicare Other

## 2019-07-24 VITALS — BP 123/55 | HR 79 | Temp 98.0°F | Resp 18 | Ht 68.0 in | Wt 223.8 lb

## 2019-07-24 DIAGNOSIS — Z79811 Long term (current) use of aromatase inhibitors: Secondary | ICD-10-CM | POA: Insufficient documentation

## 2019-07-24 DIAGNOSIS — R232 Flushing: Secondary | ICD-10-CM | POA: Insufficient documentation

## 2019-07-24 DIAGNOSIS — Z170 Estrogen receptor positive status [ER+]: Secondary | ICD-10-CM | POA: Insufficient documentation

## 2019-07-24 DIAGNOSIS — Z9225 Personal history of immunosupression therapy: Secondary | ICD-10-CM | POA: Diagnosis not present

## 2019-07-24 DIAGNOSIS — C50412 Malignant neoplasm of upper-outer quadrant of left female breast: Secondary | ICD-10-CM

## 2019-07-24 DIAGNOSIS — Z9221 Personal history of antineoplastic chemotherapy: Secondary | ICD-10-CM | POA: Insufficient documentation

## 2019-07-24 DIAGNOSIS — Z79899 Other long term (current) drug therapy: Secondary | ICD-10-CM | POA: Insufficient documentation

## 2019-07-24 LAB — CBC WITH DIFFERENTIAL/PLATELET
Abs Immature Granulocytes: 0.03 10*3/uL (ref 0.00–0.07)
Basophils Absolute: 0.1 10*3/uL (ref 0.0–0.1)
Basophils Relative: 1 %
Eosinophils Absolute: 0.2 10*3/uL (ref 0.0–0.5)
Eosinophils Relative: 3 %
HCT: 37.1 % (ref 36.0–46.0)
Hemoglobin: 11.9 g/dL — ABNORMAL LOW (ref 12.0–15.0)
Immature Granulocytes: 0 %
Lymphocytes Relative: 24 %
Lymphs Abs: 1.8 10*3/uL (ref 0.7–4.0)
MCH: 26.2 pg (ref 26.0–34.0)
MCHC: 32.1 g/dL (ref 30.0–36.0)
MCV: 81.7 fL (ref 80.0–100.0)
Monocytes Absolute: 0.5 10*3/uL (ref 0.1–1.0)
Monocytes Relative: 7 %
Neutro Abs: 4.9 10*3/uL (ref 1.7–7.7)
Neutrophils Relative %: 65 %
Platelets: 265 10*3/uL (ref 150–400)
RBC: 4.54 MIL/uL (ref 3.87–5.11)
RDW: 14.6 % (ref 11.5–15.5)
WBC: 7.6 10*3/uL (ref 4.0–10.5)
nRBC: 0 % (ref 0.0–0.2)

## 2019-07-24 LAB — COMPREHENSIVE METABOLIC PANEL
ALT: 11 U/L (ref 0–44)
AST: 14 U/L — ABNORMAL LOW (ref 15–41)
Albumin: 3.9 g/dL (ref 3.5–5.0)
Alkaline Phosphatase: 112 U/L (ref 38–126)
Anion gap: 9 (ref 5–15)
BUN: 18 mg/dL (ref 8–23)
CO2: 29 mmol/L (ref 22–32)
Calcium: 8.9 mg/dL (ref 8.9–10.3)
Chloride: 103 mmol/L (ref 98–111)
Creatinine, Ser: 0.82 mg/dL (ref 0.44–1.00)
GFR calc Af Amer: >60 mL/min (ref >60)
GFR calc non Af Amer: >60 mL/min (ref >60)
Glucose, Bld: 113 mg/dL — ABNORMAL HIGH (ref 70–99)
Potassium: 2.8 mmol/L — CL (ref 3.5–5.1)
Sodium: 141 mmol/L (ref 135–145)
Total Bilirubin: 0.7 mg/dL (ref 0.3–1.2)
Total Protein: 7.2 g/dL (ref 6.5–8.1)

## 2019-07-24 MED ORDER — POTASSIUM CHLORIDE CRYS ER 20 MEQ PO TBCR: 20.0000 meq | EXTENDED_RELEASE_TABLET | Freq: Every day | ORAL | 1 refills | Status: DC

## 2019-07-24 NOTE — Telephone Encounter (Signed)
Spoke with pt by phone to inform of potassium level and new prescription for potassium chloride.  Pt instructed to take 1 pill daily until she has had a chance to follow up with her PCP for further guidance. Pt verbalizes understanding of instructions.

## 2019-07-24 NOTE — Telephone Encounter (Signed)
Called pt and lvm for pt to return call Serum potassium 2.8. Prescription for potassium chloride 35meq sent to pt's pharmacy to take once daily. Per Dr Jana Hakim pt should continue medication until she has had a chance to follow up with her PCP for further guidance.

## 2019-08-15 ENCOUNTER — Other Ambulatory Visit: Payer: Self-pay | Admitting: Oncology

## 2019-08-23 ENCOUNTER — Other Ambulatory Visit: Payer: Self-pay | Admitting: Internal Medicine

## 2019-08-23 DIAGNOSIS — Z853 Personal history of malignant neoplasm of breast: Secondary | ICD-10-CM

## 2019-08-24 ENCOUNTER — Other Ambulatory Visit: Payer: Self-pay | Admitting: Oncology

## 2019-08-24 DIAGNOSIS — Z853 Personal history of malignant neoplasm of breast: Secondary | ICD-10-CM

## 2019-08-26 ENCOUNTER — Ambulatory Visit: Payer: Medicare Other | Attending: Internal Medicine

## 2019-08-26 DIAGNOSIS — Z23 Encounter for immunization: Secondary | ICD-10-CM | POA: Insufficient documentation

## 2019-08-26 NOTE — Progress Notes (Signed)
   Covid-19 Vaccination Clinic  Name:  April Walker    MRN: [id number] DOB: 04/30/1951  08/26/2019  April Walker was observed post Covid-19 immunization for 30 minutes based on pre-vaccination screening without incidence. She was provided with Vaccine Information Sheet and instruction to access the V-Safe system.   April Walker was instructed to call 911 with any severe reactions post vaccine: Marland Kitchen Difficulty breathing  . Swelling of your face and throat  . A fast heartbeat  . A bad rash all over your body  . Dizziness and weakness    Immunizations Administered    Name Date Dose VIS Date Route   Pfizer COVID-19 Vaccine 08/26/2019 11:58 AM 0.3 mL 06/16/2019 Intramuscular   Manufacturer: [place]   Lot: [id number]   [place]: [id number]

## 2019-09-07 ENCOUNTER — Other Ambulatory Visit: Payer: Self-pay | Admitting: Hematology and Oncology

## 2019-09-18 ENCOUNTER — Ambulatory Visit: Payer: Medicare Other | Attending: Internal Medicine

## 2019-09-18 DIAGNOSIS — Z23 Encounter for immunization: Secondary | ICD-10-CM

## 2019-09-18 NOTE — Progress Notes (Signed)
   Covid-19 Vaccination Clinic  Name:  April Walker    MRN: [id number] DOB: [birth year]  09/18/2019  [name] was observed post Covid-19 immunization for 15 minutes without incident. She was provided with Vaccine Information Sheet and instruction to access the V-Safe system.   [name] was instructed to call 911 with any severe reactions post vaccine: Marland Kitchen Difficulty breathing  . Swelling of face and throat  . A fast heartbeat  . A bad rash all over body  . Dizziness and weakness   Immunizations Administered    Name Date Dose VIS Date Route   Pfizer COVID-19 Vaccine 09/18/2019  9:10 AM 0.3 mL 06/16/2019 Intramuscular   Manufacturer: [place]   Lot: [id number]   [place]: [id number]

## 2019-10-04 ENCOUNTER — Other Ambulatory Visit: Payer: Self-pay

## 2019-10-04 ENCOUNTER — Ambulatory Visit
Admission: RE | Admit: 2019-10-04 | Discharge: 2019-10-04 | Disposition: A | Discharge status: Home / Self Care | Payer: Medicare Other | Source: Ambulatory Visit | Attending: Internal Medicine | Admitting: Internal Medicine

## 2019-10-04 DIAGNOSIS — Z853 Personal history of malignant neoplasm of breast: Secondary | ICD-10-CM

## 2019-10-04 HISTORY — DX: Personal history of irradiation: Z92.3

## 2020-04-20 ENCOUNTER — Ambulatory Visit: Payer: Medicare Other | Attending: Internal Medicine

## 2020-04-20 DIAGNOSIS — Z23 Encounter for immunization: Secondary | ICD-10-CM

## 2020-04-20 NOTE — Progress Notes (Signed)
° °  Covid-19 Vaccination Clinic  Name:  Caia Lofaro    MRN: [id number] DOB: 04-06-1951  04/20/2020  Ms. Harwood was observed post Covid-19 immunization for 30 minutes based on pre-vaccination screening without incident. She was provided with Vaccine Information Sheet and instruction to access the V-Safe system.   Ms. Colquhoun was instructed to call 911 with any severe reactions post vaccine:  Difficulty breathing   Swelling of face and throat   A fast heartbeat   A bad rash all over body   Dizziness and weakness

## 2020-08-27 ENCOUNTER — Other Ambulatory Visit: Payer: Self-pay | Admitting: Oncology

## 2020-08-27 DIAGNOSIS — Z1231 Encounter for screening mammogram for malignant neoplasm of breast: Secondary | ICD-10-CM

## 2020-10-16 ENCOUNTER — Ambulatory Visit
Admission: RE | Admit: 2020-10-16 | Discharge: 2020-10-16 | Disposition: A | Discharge status: Home / Self Care | Payer: Medicare Other | Source: Ambulatory Visit | Attending: Oncology | Admitting: Oncology

## 2020-10-16 ENCOUNTER — Other Ambulatory Visit: Payer: Self-pay

## 2020-10-16 DIAGNOSIS — Z1231 Encounter for screening mammogram for malignant neoplasm of breast: Secondary | ICD-10-CM

## 2020-12-05 ENCOUNTER — Other Ambulatory Visit: Payer: Self-pay | Admitting: Internal Medicine

## 2020-12-05 DIAGNOSIS — M8580 Other specified disorders of bone density and structure, unspecified site: Secondary | ICD-10-CM

## 2021-03-27 ENCOUNTER — Other Ambulatory Visit: Payer: Self-pay

## 2021-03-27 ENCOUNTER — Ambulatory Visit: Payer: Medicare Other | Admitting: Internal Medicine

## 2021-03-27 ENCOUNTER — Encounter: Payer: Self-pay | Admitting: Internal Medicine

## 2021-03-27 VITALS — BP 132/76 | HR 71 | Ht 68.0 in | Wt 209.0 lb

## 2021-03-27 DIAGNOSIS — R0609 Other forms of dyspnea: Secondary | ICD-10-CM

## 2021-03-27 DIAGNOSIS — R0600 Dyspnea, unspecified: Secondary | ICD-10-CM | POA: Diagnosis not present

## 2021-03-27 NOTE — Progress Notes (Signed)
Cardiology Office Note   Date:  03/27/2021   ID:  April, Walker December 25, 1950, MRN [id number]  PCP:  Michael Boston, MD  Cardiologist:   Dorris Carnes, MD   Pt presents for evaluation of dyspnea   History of Present Illness: April Walker is a 70 y.o. female with a history of HTN, HL, GERD, Breat CA    Followd by L Wile at [place] pt is referred because she has noted DOE     She says that when she is walking up an incline she has to stop 1/2 way up   maybe even more.  With spells she can have a little chest tightness after    Not during   Rests and it is gone   Has tried to build up endurance with walking more but it has not helped She denies palpitations   Note that hte pt had breast cancer and had chemotherapy   Had serial echoes at that time evaluating strain    last one was 2016  LVEF was 55 to 60%  GLS was -14.1% though not tracking well  No outpatient medications have been marked as taking for the 03/27/21 encounter (Office Visit) with Fay Records, MD.   Current Facility-Administered Medications for the 03/27/21 encounter (Office Visit) with Fay Records, MD  Medication   0.9 %  sodium chloride infusion     Allergies:   Sulfonamide derivatives and Triprolidine-pseudoephedrine   Past Medical History:  Diagnosis Date   Abnormal cholesterol test    Allergy    seasonal -   Arthritis    Cancer ([place]) 2015   breast- left -chemo and radiation s/p lumpectomy   Cataract    removed both eyes   Diverticulosis    GERD (gastroesophageal reflux disease)    Hearing deficit    no hearing aids   History of colon polyps 12/09/2010   Hyperlipidemia    Hypertension    mild    Internal hemorrhoids    Knee pain, left    Personal history of chemotherapy    Personal history of radiation therapy    Radiation 05/02/14-06/19/14   Left Breast   Tubulovillous adenoma of colon    Wears glasses     Past Surgical History:  Procedure Laterality Date   BREAST  EXCISIONAL BIOPSY Right 2010   BREAST LUMPECTOMY Left    2015   CATARACT EXTRACTION, BILATERAL  04/2014   bilateral dr Katy Fitch    COLONOSCOPY  2002   hemorrhoids   COLONOSCOPY W/ POLYPECTOMY  12/09/2010   1 cm sigmoid polyp - , diverticulosis, hemorrhoids   HEMORRHOID SURGERY     KNEE SURGERY     KNEE SURGERY  2012   Left knee arthroscopy   PARTIAL MASTECTOMY WITH NEEDLE LOCALIZATION AND AXILLARY SENTINEL LYMPH NODE BX Left 03/21/14   Dr.Faera Byerly   POLYPECTOMY     PORT-A-CATH REMOVAL Left 01/01/2015   Procedure: REMOVAL PORT-A-CATH;  Surgeon: Stark Klein, MD;  Location: [place];  Service: General;  Laterality: Left;   PORTACATH PLACEMENT Left 11/17/2013   Procedure: INSERTION PORT-A-CATH;  Surgeon: Stark Klein, MD;  Location: [place];  Service: General;  Laterality: Left;   right knee arthroscopy  2011 Dr. Aline Brochure   TONSILLECTOMY AND ADENOIDECTOMY     TOTAL KNEE ARTHROPLASTY  11/23/2011   Procedure: TOTAL KNEE ARTHROPLASTY;  Surgeon: Carole Civil, MD;  [facility];  Service: Orthopedics;  Laterality: Left;   TREATMENT FISTULA ANAL     UPPER GASTROINTESTINAL ENDOSCOPY       Social History:  The patient  reports that she has never smoked. She has never used smokeless tobacco. She reports current alcohol use. She reports that she does not use drugs.   Family History:  The patient's family history includes Heart attack in her father; Stroke in her mother. She was adopted.    ROS:  Please see the history of present illness. All other systems are reviewed and  Negative to the above problem except as noted.    PHYSICAL EXAM: VS:  BP 132/76   Pulse 71   Ht 5\' 8"  (1.727 m)   Wt 209 lb (94.8 kg)   SpO2 98%   BMI 31.78 kg/m   GEN: Well nourished, well developed, in no acute distress  HEENT: normal  Neck: no JVD, carotid bruits Cardiac: RRR; no murmurs,  No LE  edema  Respiratory:  clear to auscultation bilaterally,  GI: soft,  nontender, nondistended, + BS  No hepatomegaly  MS: no deformity Moving all extremities   Skin: warm and dry, no rash Neuro:  Strength and sensation are intact Psych: euthymic mood, full affect   EKG:  EKG is ordered today.  NSR   71 bpm   LVH  IWMI  Nonspecific ST changes    Lipid Panel No results found for: CHOL, TRIG, HDL, CHOLHDL, VLDL, LDLCALC, LDLDIRECT    Wt Readings from Last 3 Encounters:  03/27/21 209 lb (94.8 kg)  07/24/19 223 lb 12.8 oz (101.5 kg)  01/17/18 215 lb (97.5 kg)      ASSESSMENT AND PLAN:  1  DOE   Pt with DOE that is rel new, not improving    On exma, fluid looks OK   EKG with inferior Q waves   Recomm a repeat echo to reeval LVEF esp given Ca history     Based on findings consider CT coronary angiogram to eval anatomy     2   HTN   BP is fair  Continue    3  HL  Last lipids   LDL 104   HDL  54      Current medicines are reviewed at length with the patient today.  The patient does not have concerns regarding medicines.  Signed, Dorris Carnes, MD  03/27/2021 10:39 AM    [address], [place], [place]  [address] Phone: [phone]; Fax: [phone]

## 2021-03-27 NOTE — Patient Instructions (Signed)
Medication Instructions:  Your physician recommends that you continue on your current medications as directed. Please refer to the Current Medication list given to you today.  *If you need a refill on your cardiac medications before your next appointment, please call your pharmacy*   Lab Work: none If you have labs (blood work) drawn today and your tests are completely normal, you will receive your results only by: [place] (if you have MyChart) OR A paper copy in the mail If you have any lab test that is abnormal or we need to change your treatment, we will call you to review the results.   Testing/Procedures: Your physician has requested that you have an echocardiogram. Echocardiography is a painless test that uses sound waves to create images of your heart. It provides your doctor with information about the size and shape of your heart and how well your heart's chambers and valves are working. This procedure takes approximately one hour. There are no restrictions for this procedure.    Follow-Up: At [facility], you and your health needs are our priority.  As part of our continuing mission to provide you with exceptional heart care, we have created designated Provider Care Teams.  These Care Teams include your primary Cardiologist (physician) and Advanced Practice Providers (APPs -  Physician Assistants and Nurse Practitioners) who all work together to provide you with the care you need, when you need it.  We recommend signing up for the patient portal called "MyChart".  Sign up information is provided on this After Visit Summary.  MyChart is used to connect with patients for Virtual Visits (Telemedicine).  Patients are able to view lab/test results, encounter notes, upcoming appointments, etc.  Non-urgent messages can be sent to your provider as well.   To learn more about what you can do with MyChart, go to [email or web address].    Your next appointment:   Based on test  results  The format for your next appointment:   In Person  Provider:   You may see Dorris Carnes, MD or one of the following Advanced Practice Providers on your designated Care Team:   Richardson Dopp, PA-C Robbie Lis, [place]   Other Instructions

## 2021-04-14 ENCOUNTER — Other Ambulatory Visit: Payer: Self-pay

## 2021-04-14 ENCOUNTER — Ambulatory Visit (HOSPITAL_COMMUNITY): Payer: Medicare Other | Attending: Cardiovascular Disease

## 2021-04-14 DIAGNOSIS — R0609 Other forms of dyspnea: Secondary | ICD-10-CM | POA: Diagnosis present

## 2021-04-14 LAB — ECHOCARDIOGRAM COMPLETE
Area-P 1/2: 4.35 cm2
S' Lateral: 3.3 cm

## 2021-04-16 ENCOUNTER — Telehealth: Payer: Self-pay

## 2021-04-16 DIAGNOSIS — R0789 Other chest pain: Secondary | ICD-10-CM

## 2021-04-16 DIAGNOSIS — Z01810 Encounter for preprocedural cardiovascular examination: Secondary | ICD-10-CM

## 2021-04-16 MED ORDER — METOPROLOL TARTRATE 100 MG PO TABS: ORAL_TABLET | ORAL | 0 refills | Status: DC

## 2021-04-16 NOTE — Addendum Note (Signed)
Addended by: Stephani Police on: 04/16/2021 04:29 PM   Modules accepted: Orders

## 2021-04-16 NOTE — Telephone Encounter (Signed)
Pt agreed to a Cardiac CT and verbalized understanding of her instructions that I will also send to her My Chart.

## 2021-04-16 NOTE — Addendum Note (Signed)
Addended by: Stephani Police on: 04/16/2021 02:26 PM   Modules accepted: Orders

## 2021-04-16 NOTE — Telephone Encounter (Signed)
-----   Message from Fay Records, MD sent at 04/16/2021  9:43 AM EDT ----- Called patient  Reviewed echo findings   Normal LVEF /RVEF/Valve function  Strain and diastolic funciton normal She still is SOB with activity   ? Anginal equivalent Recomm CT coronary angiogram to eval for significant CAD  pt in agreement with plan

## 2021-04-21 ENCOUNTER — Other Ambulatory Visit: Payer: Medicare Other | Admitting: *Deleted

## 2021-04-21 ENCOUNTER — Other Ambulatory Visit: Payer: Self-pay

## 2021-04-21 DIAGNOSIS — Z01810 Encounter for preprocedural cardiovascular examination: Secondary | ICD-10-CM

## 2021-04-21 LAB — BASIC METABOLIC PANEL
BUN/Creatinine Ratio: 19 (ref 12–28)
BUN: 14 mg/dL (ref 8–27)
CO2: 26 mmol/L (ref 20–29)
Calcium: 9.4 mg/dL (ref 8.7–10.3)
Chloride: 100 mmol/L (ref 96–106)
Creatinine, Ser: 0.73 mg/dL (ref 0.57–1.00)
Glucose: 116 mg/dL — ABNORMAL HIGH (ref 70–99)
Potassium: 3.7 mmol/L (ref 3.5–5.2)
Sodium: 139 mmol/L (ref 134–144)
eGFR: 87 mL/min/{1.73_m2} (ref >59)

## 2021-04-24 ENCOUNTER — Telehealth (HOSPITAL_COMMUNITY): Payer: Self-pay | Admitting: *Deleted

## 2021-04-24 NOTE — Telephone Encounter (Signed)
Attempted to call patient regarding upcoming cardiac CT appointment. °Left message on voicemail with name and callback number ° °Geneal Huebert RN Navigator Cardiac Imaging °[facility] °[phone] Office °[phone] Cell ° °

## 2021-04-25 ENCOUNTER — Other Ambulatory Visit: Payer: Self-pay

## 2021-04-25 ENCOUNTER — Ambulatory Visit (HOSPITAL_COMMUNITY)
Admission: RE | Admit: 2021-04-25 | Discharge: 2021-04-25 | Disposition: A | Discharge status: Home / Self Care | Payer: Medicare Other | Source: Ambulatory Visit | Attending: Internal Medicine | Admitting: Internal Medicine

## 2021-04-25 DIAGNOSIS — R0789 Other chest pain: Secondary | ICD-10-CM | POA: Insufficient documentation

## 2021-04-25 DIAGNOSIS — I700 Atherosclerosis of aorta: Secondary | ICD-10-CM | POA: Insufficient documentation

## 2021-04-25 DIAGNOSIS — I2510 Atherosclerotic heart disease of native coronary artery without angina pectoris: Secondary | ICD-10-CM | POA: Diagnosis not present

## 2021-04-25 MED ORDER — NITROGLYCERIN 0.4 MG SL SUBL
0.8000 mg | SUBLINGUAL_TABLET | Freq: Once | SUBLINGUAL | Status: AC
  Administered 2021-04-25: 0.8 mg via SUBLINGUAL

## 2021-04-25 MED ORDER — NITROGLYCERIN 0.4 MG SL SUBL
SUBLINGUAL_TABLET | SUBLINGUAL | Status: AC
  Filled 2021-04-25: qty 2

## 2021-04-25 MED ORDER — IOHEXOL 350 MG/ML SOLN
100.0000 mL | Freq: Once | INTRAVENOUS | Status: AC | PRN
  Administered 2021-04-25: 100 mL via INTRAVENOUS

## 2021-04-30 NOTE — Addendum Note (Signed)
Addended by: Patterson Hammersmith A on: 04/30/2021 08:58 AM   Modules accepted: Orders

## 2021-05-01 ENCOUNTER — Telehealth: Payer: Self-pay

## 2021-05-01 DIAGNOSIS — E785 Hyperlipidemia, unspecified: Secondary | ICD-10-CM

## 2021-05-01 DIAGNOSIS — R931 Abnormal findings on diagnostic imaging of heart and coronary circulation: Secondary | ICD-10-CM

## 2021-05-01 NOTE — Telephone Encounter (Signed)
PT advised and she will be in 05/12/21 for her Lipomed.Marland Kitchenlast Lipids were May 2022.

## 2021-05-01 NOTE — Telephone Encounter (Signed)
-----   Message from [place], MD sent at 04/28/2021 10:06 PM EDT ----- Reviewed with patient    MIld CAD  Nothing appears to be flow limiting to cause symptoms   She says that she is walking, working out   Breathing is OK  Given score I would recomm 1.  Ec ASA 81 mg  Need to get recent lipid or if she has not had need to get lipomed panel

## 2021-05-12 ENCOUNTER — Other Ambulatory Visit: Payer: Medicare Other | Admitting: *Deleted

## 2021-05-12 ENCOUNTER — Other Ambulatory Visit: Payer: Self-pay

## 2021-05-12 DIAGNOSIS — R931 Abnormal findings on diagnostic imaging of heart and coronary circulation: Secondary | ICD-10-CM

## 2021-05-12 DIAGNOSIS — E785 Hyperlipidemia, unspecified: Secondary | ICD-10-CM

## 2021-05-13 ENCOUNTER — Telehealth: Payer: Self-pay | Admitting: Internal Medicine

## 2021-05-13 NOTE — Telephone Encounter (Signed)
Patient returned Ann's phone call.

## 2021-05-13 NOTE — Telephone Encounter (Signed)
The patient has been notified of the result and verbalized understanding.  All questions (if any) were answered. Antonieta Iba, RN 05/13/2021 11:52 AM   Fay Records, MD  05/08/2021 10:32 PM EDT     LDL Is 104   SHould be lower given that she has some plaquing on artieres I would recomm Crestor 10 mg daily    F/U lipids and LFTs in 8 wks       Fay Records, MD  05/08/2021 10:33 PM EDT     Vit D level is low   25  I would recomm Vit D 5000 U per day    F/U vit D with lipids in 8 wks     Patient currently on simvastatin 10 mg daily. She had lipoprotein panel done 11/7 that has not been reviewed. Will await for review by Dr. Harrington Challenger for further advisement on cholesterol medications. Patient is currently taking Vit D 2000 units.

## 2021-05-14 ENCOUNTER — Telehealth: Payer: Self-pay

## 2021-05-14 DIAGNOSIS — Z79899 Other long term (current) drug therapy: Secondary | ICD-10-CM

## 2021-05-14 DIAGNOSIS — E785 Hyperlipidemia, unspecified: Secondary | ICD-10-CM

## 2021-05-14 LAB — NMR, LIPOPROFILE
Cholesterol, Total: 186 mg/dL (ref 100–199)
HDL Particle Number: 37.9 umol/L (ref >=30.5)
HDL-C: 61 mg/dL (ref >39)
LDL Particle Number: 1163 nmol/L — ABNORMAL HIGH (ref <1000)
LDL Size: 21 nm (ref >20.5)
LDL-C (NIH Calc): 102 mg/dL — ABNORMAL HIGH (ref 0–99)
LP-IR Score: 32 (ref <=45)
Small LDL Particle Number: 481 nmol/L (ref <=527)
Triglycerides: 131 mg/dL (ref 0–149)

## 2021-05-14 LAB — APOLIPOPROTEIN B: Apolipoprotein B: 82 mg/dL (ref <90)

## 2021-05-14 LAB — LIPOPROTEIN A (LPA): Lipoprotein (a): 22 nmol/L (ref <75.0)

## 2021-05-14 MED ORDER — ROSUVASTATIN CALCIUM 20 MG PO TABS: 20.0000 mg | ORAL_TABLET | Freq: Every day | ORAL | 3 refills | Status: DC

## 2021-05-14 NOTE — Telephone Encounter (Signed)
-----   Message from Dorris Carnes V, MD sent at 05/14/2021  9:45 AM EST ----- Spoke to patient about results   Needs tighter control Recomm:  Stop Rx of simvistatin (called in by PCP) 2.  Start Crestor 20 mg  3   Follow up lipomed (no apob or lpa) with liver funciton in 8 ks 4   I will see patient in July/Aug

## 2021-05-14 NOTE — Telephone Encounter (Signed)
New RX ordered for Crestor 20 mg daily..   Recall placed for summer follow up and labs to be drawn fasting 07/15/2021.

## 2021-06-11 ENCOUNTER — Ambulatory Visit
Admission: RE | Admit: 2021-06-11 | Discharge: 2021-06-11 | Disposition: A | Discharge status: Home / Self Care | Payer: Medicare Other | Source: Ambulatory Visit | Attending: Internal Medicine | Admitting: Internal Medicine

## 2021-06-11 ENCOUNTER — Other Ambulatory Visit: Payer: Self-pay

## 2021-06-11 DIAGNOSIS — M8580 Other specified disorders of bone density and structure, unspecified site: Secondary | ICD-10-CM

## 2021-07-15 ENCOUNTER — Other Ambulatory Visit: Payer: Medicare Other | Admitting: *Deleted

## 2021-07-15 ENCOUNTER — Other Ambulatory Visit: Payer: Self-pay

## 2021-07-15 DIAGNOSIS — E785 Hyperlipidemia, unspecified: Secondary | ICD-10-CM

## 2021-07-15 DIAGNOSIS — Z79899 Other long term (current) drug therapy: Secondary | ICD-10-CM

## 2021-07-16 LAB — NMR, LIPOPROFILE
Cholesterol, Total: 156 mg/dL (ref 100–199)
HDL Particle Number: 39.9 umol/L (ref >=30.5)
HDL-C: 66 mg/dL (ref >39)
LDL Particle Number: 786 nmol/L (ref <1000)
LDL Size: 20.5 nm — ABNORMAL LOW (ref >20.5)
LDL-C (NIH Calc): 69 mg/dL (ref 0–99)
LP-IR Score: 31 (ref <=45)
Small LDL Particle Number: 394 nmol/L (ref <=527)
Triglycerides: 117 mg/dL (ref 0–149)

## 2021-07-16 LAB — HEPATIC FUNCTION PANEL
ALT: 10 IU/L (ref 0–32)
AST: 17 IU/L (ref 0–40)
Albumin: 4.4 g/dL (ref 3.7–4.7)
Alkaline Phosphatase: 100 IU/L (ref 44–121)
Bilirubin Total: 0.5 mg/dL (ref 0.0–1.2)
Bilirubin, Direct: 0.18 mg/dL (ref 0.00–0.40)
Total Protein: 6.6 g/dL (ref 6.0–8.5)

## 2021-11-18 ENCOUNTER — Other Ambulatory Visit: Payer: Self-pay | Admitting: Internal Medicine

## 2021-11-18 DIAGNOSIS — Z1231 Encounter for screening mammogram for malignant neoplasm of breast: Secondary | ICD-10-CM

## 2021-11-25 ENCOUNTER — Ambulatory Visit
Admission: RE | Admit: 2021-11-25 | Discharge: 2021-11-25 | Disposition: A | Discharge status: Home / Self Care | Payer: Medicare Other | Source: Ambulatory Visit | Attending: Internal Medicine | Admitting: Internal Medicine

## 2021-11-25 DIAGNOSIS — Z1231 Encounter for screening mammogram for malignant neoplasm of breast: Secondary | ICD-10-CM

## 2022-05-17 ENCOUNTER — Other Ambulatory Visit: Payer: Self-pay | Admitting: Internal Medicine

## 2022-05-18 ENCOUNTER — Other Ambulatory Visit: Payer: Self-pay

## 2022-06-06 ENCOUNTER — Other Ambulatory Visit: Payer: Self-pay | Admitting: Internal Medicine

## 2022-06-11 ENCOUNTER — Other Ambulatory Visit: Payer: Self-pay | Admitting: Internal Medicine

## 2022-06-11 NOTE — Telephone Encounter (Signed)
Rx refill sent to pharmacy. 

## 2022-06-19 ENCOUNTER — Other Ambulatory Visit: Payer: Self-pay | Admitting: Internal Medicine

## 2022-07-28 ENCOUNTER — Ambulatory Visit: Payer: Medicare Other | Admitting: Internal Medicine

## 2022-08-04 NOTE — Progress Notes (Signed)
Cardiology Office Note   Date:  08/07/2022   ID:  Akili, Cuda 02-26-1951, MRN [id number]  PCP:  Michael Boston, MD  Cardiologist:   Dorris Carnes, MD   Pt presents for follow up of HTN, HL     History of Present Illness: April Walker is a 72 y.o. female with a history of HTN, HL, GERD, Breat CA    Followd by L Wile at [facility]   It saw the pt in 2022 for dyspnea.  EKG had inferior Q waves   Echo done showed LVEF and RVEF were normal, normal strain and normal diastolic function The pt also had a CT coronary angiogram  in OCt 2022  Ca score was 91 (66%ile)   Mild nonobstructive CAD   Since seen she has done well  She has been under increased stress the past couple weeks  Moving  Breathing is stable   No CP  rare dizzienss.  No palpitations   Current Meds  Medication Sig   acetaminophen (TYLENOL) 500 MG tablet Take 500 mg by mouth as needed.   hydrochlorothiazide (HYDRODIURIL) 25 MG tablet Take 25 mg by mouth daily.   loratadine (CLARITIN) 10 MG tablet Take 10 mg by mouth daily.   losartan (COZAAR) 100 MG tablet Take 100 mg by mouth daily.   omeprazole (PRILOSEC) 20 MG capsule Take 20 mg by mouth as needed.   rosuvastatin (CRESTOR) 20 MG tablet Take 1 tablet (20 mg total) by mouth daily. Please call [phone] to schedule an overdue appointment.  3rd attempt thank you.     Allergies:   Sulfonamide derivatives and Triprolidine-pseudoephedrine   Past Medical History:  Diagnosis Date   Abnormal cholesterol test    Allergy    seasonal -   Arthritis    Cancer ([place]) 2015   breast- left -chemo and radiation s/p lumpectomy   Cataract    removed both eyes   Diverticulosis    GERD (gastroesophageal reflux disease)    Hearing deficit    no hearing aids   History of colon polyps 12/09/2010   Hyperlipidemia    Hypertension    mild    Internal hemorrhoids    Knee pain, left    Personal history of chemotherapy    Personal history of radiation therapy     Radiation 05/02/14-06/19/14   Left Breast   Tubulovillous adenoma of colon    Wears glasses     Past Surgical History:  Procedure Laterality Date   BREAST EXCISIONAL BIOPSY Right 2010   BREAST LUMPECTOMY Left    2015   CATARACT EXTRACTION, BILATERAL  04/2014   bilateral dr Katy Fitch    COLONOSCOPY  2002   hemorrhoids   COLONOSCOPY W/ POLYPECTOMY  12/09/2010   1 cm sigmoid polyp - , diverticulosis, hemorrhoids   HEMORRHOID SURGERY     KNEE SURGERY     KNEE SURGERY  2012   Left knee arthroscopy   PARTIAL MASTECTOMY WITH NEEDLE LOCALIZATION AND AXILLARY SENTINEL LYMPH NODE BX Left 03/21/14   Dr.Faera Byerly   POLYPECTOMY     PORT-A-CATH REMOVAL Left 01/01/2015   Procedure: REMOVAL PORT-A-CATH;  Surgeon: Stark Klein, MD;  Location: [facility];  Service: General;  Laterality: Left;   PORTACATH PLACEMENT Left 11/17/2013   Procedure: INSERTION PORT-A-CATH;  Surgeon: Stark Klein, MD;  Location: [facility];  Service: General;  Laterality: Left;   right knee arthroscopy  2011 Dr. Aline Brochure   TONSILLECTOMY AND ADENOIDECTOMY  TOTAL KNEE ARTHROPLASTY  11/23/2011   Procedure: TOTAL KNEE ARTHROPLASTY;  Surgeon: Carole Civil, MD;  Location: [facility];  Service: Orthopedics;  Laterality: Left;   TREATMENT FISTULA ANAL     UPPER GASTROINTESTINAL ENDOSCOPY       Social History:  The patient  reports that she has never smoked. She has never used smokeless tobacco. She reports current alcohol use. She reports that she does not use drugs.   Family History:  The patient's family history includes Heart attack in her father; Stroke in her mother. She was adopted.    ROS:  Please see the history of present illness. All other systems are reviewed and  Negative to the above problem except as noted.    PHYSICAL EXAM: VS:  BP 120/68   Pulse 82   Ht '5\' 8"'$  (1.727 m)   Wt 216 lb (98 kg)   BMI 32.84 kg/m   GEN: Well nourished, well developed, in no acute distress  HEENT:  normal  Neck: no JVD, carotid bruit Cardiac: RRR; no murmurs,  No LE  edema  Respiratory:  clear to auscultation bilaterally,  GI: soft, nontender, nondistended, + BS  No hepatomegaly  MS: no deformity Moving all extremities   Skin: warm and dry, no rash Neuro:  Strength and sensation are intact Psych: euthymic mood, full affect   EKG:  EKG is ordered today.  NSR   82 bpm  Q waves III, AVF      Lipid Panel No results found for: "CHOL", "TRIG", "HDL", "CHOLHDL", "VLDL", "LDLCALC", "LDLDIRECT"    Wt Readings from Last 3 Encounters:  08/07/22 216 lb (98 kg)  03/27/21 209 lb (94.8 kg)  07/24/19 223 lb 12.8 oz (101.5 kg)      ASSESSMENT AND PLAN:  1 CAD  Mild on CT angiogram in 2022  Pt without symptoms of angina   Follow   2 HTN  BP is good   Continue losartan  3  HL  Will check lipids today    Will check BMET, A1C, lipomed and Vit D  Tentative follow up in 1 year   Current medicines are reviewed at length with the patient today.  The patient does not have concerns regarding medicines.  Signed, Dorris Carnes, MD  08/07/2022 5:17 [facility] [place], [place], [place]  [address] Phone: [phone]; Fax: [phone]

## 2022-08-07 ENCOUNTER — Encounter: Payer: Self-pay | Admitting: Internal Medicine

## 2022-08-07 ENCOUNTER — Ambulatory Visit: Payer: Medicare Other | Attending: Internal Medicine | Admitting: Internal Medicine

## 2022-08-07 VITALS — BP 120/68 | HR 82 | Ht 68.0 in | Wt 216.0 lb

## 2022-08-07 DIAGNOSIS — E785 Hyperlipidemia, unspecified: Secondary | ICD-10-CM | POA: Diagnosis not present

## 2022-08-07 DIAGNOSIS — R931 Abnormal findings on diagnostic imaging of heart and coronary circulation: Secondary | ICD-10-CM | POA: Diagnosis not present

## 2022-08-07 DIAGNOSIS — R0609 Other forms of dyspnea: Secondary | ICD-10-CM

## 2022-08-07 DIAGNOSIS — Z79899 Other long term (current) drug therapy: Secondary | ICD-10-CM | POA: Diagnosis not present

## 2022-08-07 NOTE — Patient Instructions (Addendum)
Medication Instructions:  Your physician recommends that you continue on your current medications as directed. Please refer to the Current Medication list given to you today.  *If you need a refill on your cardiac medications before your next appointment, please call your pharmacy*  Lab Work: Your physician recommends that you have lab work today- nmr lipo, vitamin D, BMET, A1c  If you have labs (blood work) drawn today and your tests are completely normal, you will receive your results only by: MyChart Message (if you have MyChart) OR A paper copy in the mail If you have any lab test that is abnormal or we need to change your treatment, we will call you to review the results.  Testing/Procedures: None ordered today.  Follow-Up: At [facility], you and your health needs are our priority.  As part of our continuing mission to provide you with exceptional heart care, we have created designated Provider Care Teams.  These Care Teams include your primary Cardiologist (physician) and Advanced Practice Providers (APPs -  Physician Assistants and Nurse Practitioners) who all work together to provide you with the care you need, when you need it.  We recommend signing up for the patient portal called "MyChart".  Sign up information is provided on this After Visit Summary.  MyChart is used to connect with patients for Virtual Visits (Telemedicine).  Patients are able to view lab/test results, encounter notes, upcoming appointments, etc.  Non-urgent messages can be sent to your provider as well.   To learn more about what you can do with MyChart, go to [email or web address].    Your next appointment:   12 month(s)  Provider:   Dorris Carnes, MD     Other Instructions

## 2022-08-09 LAB — BASIC METABOLIC PANEL
BUN/Creatinine Ratio: 22 (ref 12–28)
BUN: 16 mg/dL (ref 8–27)
CO2: 26 mmol/L (ref 20–29)
Calcium: 9.2 mg/dL (ref 8.7–10.3)
Chloride: 105 mmol/L (ref 96–106)
Creatinine, Ser: 0.74 mg/dL (ref 0.57–1.00)
Glucose: 131 mg/dL — ABNORMAL HIGH (ref 70–99)
Potassium: 3.3 mmol/L — ABNORMAL LOW (ref 3.5–5.2)
Sodium: 144 mmol/L (ref 134–144)
eGFR: 85 mL/min/{1.73_m2} (ref >59)

## 2022-08-09 LAB — NMR, LIPOPROFILE
Cholesterol, Total: 164 mg/dL (ref 100–199)
HDL Particle Number: 37.6 umol/L (ref >=30.5)
HDL-C: 76 mg/dL (ref >39)
LDL Particle Number: 660 nmol/L (ref <1000)
LDL Size: 21.4 nm (ref >20.5)
LDL-C (NIH Calc): 60 mg/dL (ref 0–99)
LP-IR Score: <25 (ref <=45)
Small LDL Particle Number: <90 nmol/L (ref <=527)
Triglycerides: 172 mg/dL — ABNORMAL HIGH (ref 0–149)

## 2022-08-09 LAB — HEMOGLOBIN A1C
Est. average glucose Bld gHb Est-mCnc: 126 mg/dL
Hgb A1c MFr Bld: 6 % — ABNORMAL HIGH (ref 4.8–5.6)

## 2022-08-09 LAB — VITAMIN D 25 HYDROXY (VIT D DEFICIENCY, FRACTURES): Vit D, 25-Hydroxy: 36.5 ng/mL (ref 30.0–100.0)

## 2022-08-10 ENCOUNTER — Telehealth: Payer: Self-pay

## 2022-08-10 ENCOUNTER — Other Ambulatory Visit: Payer: Self-pay | Admitting: Internal Medicine

## 2022-08-10 DIAGNOSIS — I10 Essential (primary) hypertension: Secondary | ICD-10-CM

## 2022-08-10 DIAGNOSIS — Z79899 Other long term (current) drug therapy: Secondary | ICD-10-CM

## 2022-08-10 DIAGNOSIS — R600 Localized edema: Secondary | ICD-10-CM

## 2022-08-10 MED ORDER — ROSUVASTATIN CALCIUM 20 MG PO TABS: 20.0000 mg | ORAL_TABLET | Freq: Every day | ORAL | 3 refills | Status: DC

## 2022-08-10 MED ORDER — TRIAMTERENE-HCTZ 37.5-25 MG PO TABS: 0.5000 | ORAL_TABLET | Freq: Every day | ORAL | 3 refills | Status: DC

## 2022-08-10 NOTE — Telephone Encounter (Signed)
Follow Up:     Patient is returning Ann B. Call from today, concerning her results.

## 2022-08-10 NOTE — Telephone Encounter (Signed)
Left a message for the pt to call back and Result and message sent to the pts My Chart for their review.   New RX ordered and BMET ordered but need lab appt made.

## 2022-08-10 NOTE — Telephone Encounter (Signed)
-----   Message from Dorris Carnes V, MD sent at 08/09/2022  3:00 PM EST ----- Lipids:  LDL excellent at 60   Triglycerides 172 (not fasting) Switch HCTZ to Maxzide 37.5/25   Take 1/2 tab per day Recheck BMET in 2 wks    Keep track of BP Hgb A1C is 6   So average sugars in 120x    Cut back on carbs, limit sugars

## 2022-08-11 NOTE — Telephone Encounter (Signed)
Pt verbalized understanding of her lab results and will have repeat labs 08/25/22.

## 2022-08-25 ENCOUNTER — Ambulatory Visit: Payer: Medicare Other | Attending: Internal Medicine

## 2022-08-25 DIAGNOSIS — I10 Essential (primary) hypertension: Secondary | ICD-10-CM

## 2022-08-25 DIAGNOSIS — R600 Localized edema: Secondary | ICD-10-CM

## 2022-08-25 DIAGNOSIS — Z79899 Other long term (current) drug therapy: Secondary | ICD-10-CM

## 2022-08-25 LAB — BASIC METABOLIC PANEL
BUN/Creatinine Ratio: 21 (ref 12–28)
BUN: 15 mg/dL (ref 8–27)
CO2: 27 mmol/L (ref 20–29)
Calcium: 9.9 mg/dL (ref 8.7–10.3)
Chloride: 102 mmol/L (ref 96–106)
Creatinine, Ser: 0.72 mg/dL (ref 0.57–1.00)
Glucose: 90 mg/dL (ref 70–99)
Potassium: 4 mmol/L (ref 3.5–5.2)
Sodium: 144 mmol/L (ref 134–144)
eGFR: 88 mL/min/{1.73_m2} (ref >59)

## 2022-10-13 ENCOUNTER — Other Ambulatory Visit: Payer: Self-pay | Admitting: Internal Medicine

## 2022-10-13 DIAGNOSIS — Z0000 Encounter for general adult medical examination without abnormal findings: Secondary | ICD-10-CM

## 2022-11-27 ENCOUNTER — Ambulatory Visit
Admission: RE | Admit: 2022-11-27 | Discharge: 2022-11-27 | Disposition: A | Discharge status: Home / Self Care | Payer: Medicare Other | Source: Ambulatory Visit | Attending: Internal Medicine | Admitting: Internal Medicine

## 2022-11-27 DIAGNOSIS — Z0000 Encounter for general adult medical examination without abnormal findings: Secondary | ICD-10-CM

## 2023-02-10 DIAGNOSIS — N951 Menopausal and female climacteric states: Secondary | ICD-10-CM | POA: Insufficient documentation

## 2023-02-10 DIAGNOSIS — E669 Obesity, unspecified: Secondary | ICD-10-CM | POA: Insufficient documentation

## 2023-02-15 ENCOUNTER — Ambulatory Visit: Payer: Medicare Other | Admitting: Orthopedic Surgery

## 2023-03-01 ENCOUNTER — Other Ambulatory Visit (INDEPENDENT_AMBULATORY_CARE_PROVIDER_SITE_OTHER): Payer: Medicare Other

## 2023-03-01 ENCOUNTER — Ambulatory Visit: Payer: Medicare Other | Admitting: Orthopedic Surgery

## 2023-03-01 ENCOUNTER — Encounter: Payer: Self-pay | Admitting: Orthopedic Surgery

## 2023-03-01 VITALS — BP 142/81 | HR 87 | Ht 68.0 in | Wt 219.0 lb

## 2023-03-01 DIAGNOSIS — Z01818 Encounter for other preprocedural examination: Secondary | ICD-10-CM

## 2023-03-01 DIAGNOSIS — Z96652 Presence of left artificial knee joint: Secondary | ICD-10-CM | POA: Diagnosis not present

## 2023-03-01 DIAGNOSIS — G8929 Other chronic pain: Secondary | ICD-10-CM

## 2023-03-01 DIAGNOSIS — M1711 Unilateral primary osteoarthritis, right knee: Secondary | ICD-10-CM

## 2023-03-01 MED ORDER — BUPIVACAINE-MELOXICAM ER 400-12 MG/14ML IJ SOLN
400.0000 mg | Freq: Once | INTRAMUSCULAR | Status: AC
Start: 2023-03-01 — End: ?

## 2023-03-01 NOTE — Progress Notes (Signed)
Patient: April Walker           Date of Birth: July 21, 1950           MRN: [id number] Visit Date: 03/01/2023 Requested by: Melida Quitter, MD [address] [place],  [place] [address] PCP: Melida Quitter, MD   Chief Complaint  Patient presents with   Knee Pain    RIGHT/ wants to proceed with right knee replacement    Encounter Diagnoses  Name Primary?   Chronic pain of right knee Yes   Primary osteoarthritis of right knee    S/P total knee arthroplasty, left     Plan:   Right total knee   Chief Complaint  Patient presents with   Knee Pain    RIGHT/ wants to proceed with right knee replacement     Ms. 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   Problem list, medical hx, medications and allergies reviewed   Current Outpatient Medications  Medication Instructions   acetaminophen (TYLENOL) 500 mg, Oral, As needed   loratadine (CLARITIN) 10 mg, Oral, Daily   omeprazole (PRILOSEC) 20 mg, Oral, As needed   rosuvastatin (CRESTOR) 20 mg, Oral, Daily   triamterene-hydrochlorothiazide (MAXZIDE-25) 37.5-25 MG tablet 0.5 tablets, Oral, Daily     Review of Systems  HENT:  Positive for hearing loss.   Musculoskeletal:  Positive for back pain and joint pain.     Allergies  Allergen Reactions   Sulfonamide Derivatives Anaphylaxis and Hives    Pt. Stated, "large welps on neck immediately."   Triprolidine-Pseudoephedrine Other (See Comments)    This is Actifed. Increased heart rate to tachycardia.    BP (!) 142/81   Pulse 87   Ht 5\' 8"  (1.727 m)   Wt 219 lb (99.3 kg)   BMI 33.30 kg/m    Physical exam: Physical Exam Vitals and nursing  note reviewed.  Constitutional:      Appearance: Normal appearance.  HENT:     Head: Normocephalic and atraumatic.  Eyes:     General: No scleral icterus.       Right eye: No discharge.        Left eye: No discharge.     Extraocular Movements: Extraocular movements intact.     Conjunctiva/sclera: Conjunctivae normal.     Pupils: Pupils are equal, round, and reactive to light.  Cardiovascular:     Rate and Rhythm: Normal rate.     Pulses: Normal pulses.  Musculoskeletal:     Right knee: Effusion present.     Right lower leg: Edema present.     Left lower leg: Edema present.     Comments: Right lower back pain  Skin:    General: Skin is warm and dry.     Capillary Refill: Capillary refill takes less than 2 seconds.  Neurological:     General: No focal deficit present.     Mental Status: She is alert and oriented to person, place, and time.     Sensory: No sensory deficit.     Motor: No weakness.     Gait: Gait abnormal.  Psychiatric:        Mood and  Affect: Mood normal.        Behavior: Behavior normal.        Thought Content: Thought content normal.        Judgment: Judgment normal.    Right Knee Exam   Muscle Strength  The patient has normal right knee strength.  Tenderness  The patient is experiencing tenderness in the lateral joint line and medial joint line.  Range of Motion  Extension:  0  Flexion:  100   Tests  Varus: negative Valgus: negative Drawer:  Anterior - negative    Posterior - negative  Other  Erythema: absent Scars: absent Sensation: normal Pulse: present Swelling: none Effusion: effusion present      Data reviewed:   Image(s) reviewed with personal interpretation:   Grade 4 OA with varus right knee  Assessment and plan:  Encounter Diagnoses  Name Primary?   Chronic pain of right knee Yes   Primary osteoarthritis of right knee    S/P total knee arthroplasty, left     72 year old female severe arthritis right knee.  She has  no steps to get into the house.  She will have someone to stay with her after the surgery and discharge  Okay to proceed with right total knee arthroplasty  The procedure has been fully reviewed with the patient; The risks and benefits of surgery have been discussed and explained and understood. Alternative treatment has also been reviewed, questions were encouraged and answered. The postoperative plan is also been reviewed.    No orders of the defined types were placed in this encounter.   Procedures:   no

## 2023-03-01 NOTE — Patient Instructions (Addendum)
Your surgery will be at [facility] by Dr Romeo Apple  plan to be in hospital overnight. The hospital will contact you with a preoperative appointment to discuss Anesthesia.  Please arrive on time or 15 minutes early for the preoperative appointment, they have a very tight schedule if you are late or do not come in your surgery will be cancelled.  The phone number for the preop area is [phone]. Please bring your medications with you for the appointment. They will tell you the arrival time for surgery and medication instructions when you have your preoperative evaluation. Do not wear nail polish the day of your surgery and if you take Phentermine you need to stop this medication ONE WEEK prior to your surgery. f you take Docia Barrier, Jardiance, or Steglatro) - Hold 72 hours before the procedure.  If you take Dwana Melena, [place] or Trulicity do not take for 8 days before your surgery. If you take Victoza, Rybelsis, Saxenda or Adlyxi stop 24 hours before the procedure. Please arrive at the hospital 2 hours before procedure if scheduled at 9:30 or later in the day or at the time the nurse tells you at your preoperative visit.   If you have my chart do not use the time given in my chart use the time given to you by the nurse during your preoperative visit.   Your surgery  time may change. Please be available for phone calls the day of your surgery and the day before. The Short Stay department may need to discuss changes about your surgery time. Not reaching the you could lead to procedure delays and possible cancellation.  You must have a ride home and someone to stay with you for 24 to 48 hours. The person taking you home will receive and sign for the your discharge instructions.  Please be prepared to give your support person's name and telephone number to [employer]. Dr Romeo Apple will need that name and phone number post procedure.   You will also get a call from a representative  of [employer], they have a machine that you will use in the first few weeks after surgery. It is called a CPM.   You will have home physical therapy for 2 weeks after surgery, the home health agency will call you before or just following the surgery to set up visits. [facility] is the agency we normally use, unless you request another agency.   You will get a call also from outpatient therapy for therapy starting when the home therapy is done.  If you have questions or need to Reschedule the surgery, call the office ask for Ayvin Lipinski.      You have decided to proceed with knee replacement surgery. You have decided not to continue with nonoperative measures such as but not limited to oral medication, weight loss, activity modification, physical therapy, bracing, or injection.  We will perform the procedure commonly known as total knee replacement. Some of the risks associated with knee replacement surgery include but are not limited to Bleeding Infection Swelling Stiffness Blood clot Pulmonary embolism  Loosening of the implant Pain that persists even after surgery  Infection is especially devastating complication of knee surgery although rare. If infection does occur your implant will usually have to be removed and several surgeries and antibiotics will be needed to eradicate the infection prior to performing a repeat replacement.   In some cases amputation is required to eradicate the infection. In other rare cases a  knee fusion is needed   In compliance with recent [place] law in federal regulation regarding opioid use and abuse and addiction, we will taper (stop) opioid medication after 2 weeks.  If you're not comfortable with these risks and would like to continue with nonoperative treatment please let Dr. Romeo Apple know prior to your surgery.

## 2023-03-01 NOTE — Addendum Note (Signed)
Addended byCaffie Damme on: 03/01/2023 10:55 AM   Modules accepted: Orders

## 2023-04-20 ENCOUNTER — Telehealth: Payer: Self-pay | Admitting: Radiology

## 2023-04-20 NOTE — Telephone Encounter (Signed)
Dr. Mort Sawyers pt - pt lvm stating that she was calling back to reschedule her surgery.  She can not do it 11/05, but anytime after 11/19 she can.

## 2023-04-20 NOTE — Telephone Encounter (Signed)
I have left message for her to call back ? Dec 4th.

## 2023-04-20 NOTE — Telephone Encounter (Signed)
Nov 19 th  Cancel her surgery I have left message for her to call back let me know if we can RS Nov 5th

## 2023-04-21 NOTE — Telephone Encounter (Signed)
Spoke to her she states she will check with her family and let me know

## 2023-04-22 ENCOUNTER — Telehealth: Payer: Self-pay | Admitting: Orthopedic Surgery

## 2023-04-22 NOTE — Telephone Encounter (Signed)
Ok thanks for letting me know

## 2023-04-22 NOTE — Telephone Encounter (Signed)
Dr. Mort Sawyers pt - pt lvm stating to let Amy know that she can not have her surgery 12/04 because she has family obligations out of town.  She stated she'll call back after the new year to reschedule.

## 2023-04-22 NOTE — Telephone Encounter (Signed)
She called back another note was created She has to wait due to family obligations, she will call back when she is ready

## 2023-05-04 ENCOUNTER — Ambulatory Visit: Payer: Medicare Other | Admitting: Dermatology

## 2023-05-04 ENCOUNTER — Other Ambulatory Visit: Payer: Self-pay | Admitting: Dermatology

## 2023-05-04 ENCOUNTER — Encounter: Payer: Self-pay | Admitting: Dermatology

## 2023-05-04 VITALS — BP 149/91 | HR 78

## 2023-05-04 DIAGNOSIS — L578 Other skin changes due to chronic exposure to nonionizing radiation: Secondary | ICD-10-CM

## 2023-05-04 DIAGNOSIS — W908XXA Exposure to other nonionizing radiation, initial encounter: Secondary | ICD-10-CM

## 2023-05-04 DIAGNOSIS — Z1283 Encounter for screening for malignant neoplasm of skin: Secondary | ICD-10-CM | POA: Diagnosis not present

## 2023-05-04 DIAGNOSIS — L570 Actinic keratosis: Secondary | ICD-10-CM | POA: Diagnosis not present

## 2023-05-04 DIAGNOSIS — L280 Lichen simplex chronicus: Secondary | ICD-10-CM

## 2023-05-04 DIAGNOSIS — D229 Melanocytic nevi, unspecified: Secondary | ICD-10-CM

## 2023-05-04 DIAGNOSIS — L814 Other melanin hyperpigmentation: Secondary | ICD-10-CM

## 2023-05-04 DIAGNOSIS — D1801 Hemangioma of skin and subcutaneous tissue: Secondary | ICD-10-CM

## 2023-05-04 DIAGNOSIS — L281 Prurigo nodularis: Secondary | ICD-10-CM

## 2023-05-04 DIAGNOSIS — L821 Other seborrheic keratosis: Secondary | ICD-10-CM

## 2023-05-04 MED ORDER — TACROLIMUS 0.1 % EX OINT
TOPICAL_OINTMENT | Freq: Two times a day (BID) | CUTANEOUS | 0 refills | Status: DC
Start: 2023-05-04 — End: 2023-05-04

## 2023-05-04 NOTE — Progress Notes (Signed)
New Patient Visit   Subjective  April Walker is a 72 y.o. female who presents for the following:  Total Body Skin Exam (TBSE)  Patient present today for new patient visit for TBSE.The patient denies she has spots, moles and lesions to be evaluated, some may be new or changing and the patient may have concern these could be cancer. Patient has previously been treated by a dermatologist.Patient reports she has hx of bx. Patient denies family history of skin cancers( but she is adopted). Patient reports throughout her lifetime has had moderate sun exposure. Currently, patient reports if she has excessive sun exposure, she does apply sunscreen and/or wears protective coverings.  The following portions of the chart were reviewed this encounter and updated as appropriate: medications, allergies, medical history  Review of Systems:  No other skin or systemic complaints except as noted in HPI or Assessment and Plan.  Objective  Well appearing patient in no apparent distress; mood and affect are within normal limits.  A full examination was performed including scalp, head, eyes, ears, nose, lips, neck, chest, axillae, abdomen, back, buttocks, bilateral upper extremities, bilateral lower extremities, hands, feet, fingers, toes, fingernails, and toenails. All findings within normal limits unless otherwise noted below.   Relevant exam findings are noted in the Assessment and Plan.  Left Parotid Area, Right Buccal Cheek Erythematous thin papules/macules with gritty scale.     Assessment & Plan   LENTIGINES, SEBORRHEIC KERATOSES, HEMANGIOMAS - Benign normal skin lesions - Benign-appearing - Call for any changes  MELANOCYTIC NEVI - Tan-brown and/or pink-flesh-colored symmetric macules and papules - Benign appearing on exam today - Observation - Call clinic for new or changing moles - Recommend daily use of broad spectrum spf 30+ sunscreen to sun-exposed areas.   Mild ACTINIC DAMAGE -  Chronic condition, secondary to cumulative UV/sun exposure - diffuse scaly erythematous macules with underlying dyspigmentation - Recommend daily broad spectrum sunscreen SPF 30+ to sun-exposed areas, reapply every 2 hours as needed.  - Staying in the shade or wearing long sleeves, sun glasses (UVA+UVB protection) and wide brim hats (4-inch brim around the entire circumference of the hat) are also recommended for sun protection.  - Call for new or changing lesions.  ACTINIC KERATOSIS Exam: Erythematous thin papules/macules with gritty scale at the Left Cheek  Actinic keratoses are precancerous spots that appear secondary to cumulative UV radiation exposure/sun exposure over time. They are chronic with expected duration over 1 year. A portion of actinic keratoses will progress to squamous cell carcinoma of the skin. It is not possible to reliably predict which spots will progress to skin cancer and so treatment is recommended to prevent development of skin cancer.  Recommend daily broad spectrum sunscreen SPF 30+ to sun-exposed areas, reapply every 2 hours as needed.  Recommend staying in the shade or wearing long sleeves, sun glasses (UVA+UVB protection) and wide brim hats (4-inch brim around the entire circumference of the hat). Call for new or changing lesions.  Treatment Plan: AK (actinic keratosis) (2) Left Parotid Area; Right Buccal Cheek  Destruction of lesion - Left Parotid Area Complexity: simple   Destruction method: cryotherapy   Informed consent: discussed and consent obtained   Timeout:  patient name, date of birth, surgical site, and procedure verified Lesion destroyed using liquid nitrogen: Yes   Cryotherapy cycles:  2 Post-procedure details: wound care instructions given    PRURIGO NODULARIS/LICHEN SIMPLEX CHRONICUS Exam: Excoriated papules and nodules at B/L Forearms Nodule Count: 15  Flared  Lichen simplex chronicus [facility]) is a persistent itchy area of thickened skin  that is induced by chronic rubbing and/or scratching (chronic dermatitis).  These areas may be pink, hyperpigmented and may have excoriations and bumps (prurigo nodules-PN).  PN/LSC is commonly observed in uncontrolled atopic dermatitis and other forms of eczema, and in other itchy skin conditions (eg, insect bites, scabies).  Sometimes it is not possible to know initial cause of PN/LSC if it has been present for a long time.  It generally responds well to treatment with high potency topical steroids.  It is important to stop rubbing/scratching the area in order to break the itch-scratch-rash-itch cycle, in order for the rash to resolve.   Treatment Plan: - We will plan to prescribe Tacrolimus to apply to the effected areas 2 times daily until areas clear - Avoid picking/rubbing/scratching   SKIN CANCER SCREENING PERFORMED TODAY   No follow-ups on file.   Documentation: I have reviewed the above documentation for accuracy and completeness, and I agree with the above.  Stasia Cavalier, am acting as scribe for Langston Reusing, DO.  Langston Reusing, DO

## 2023-05-04 NOTE — Patient Instructions (Addendum)
Hello [name] ,  Thank you for visiting my office today for your skin cancer screening. Your dedication to maintaining your skin health and addressing concerns promptly is greatly appreciated.  Here is a summary of the key instructions from today's visit:  - Treatment for Actinic Keratosis:   - Procedure: Apply liquid nitrogen treatment to the affected areas on your cheeks.   - Post-Treatment Care: Use Aquaphor twice daily to aid in healing.  - Skin Care Recommendations:   - Daily Moisturizer: Continue using Curel Moisturizer daily.   - For Dry and Itchy Areas: Apply Tacrolimus ointment twice daily.   - Bath Treatment: Take baths with Aveeno oatmeal treatments, using one packet per bath, followed by moisturizer.  - Follow-Up: Please return in 4 months to recheck the actinic keratosis and assess the condition of the parietal nodules. We may consider Dupixent if improvement is not observed with Tacrolimus ointment.  It was reassuring to confirm that your skin shows signs of good health, with normal findings such as cherry angiomas, benign nevi, and lentigines.  Thank you once again for your proactive approach to your dermatological health. I look forward to our next appointment to continue monitoring and managing your skin care.  Best regards,  Dr. Langston Reusing,  Dermatologist    Cryotherapy Aftercare  Wash gently with soap and water everyday.   Apply Vaseline and Band-Aid daily until healed.   Important Information   Due to recent changes in healthcare laws, you may see results of your pathology and/or laboratory studies on MyChart before the doctors have had a chance to review them. We understand that in some cases there may be results that are confusing or concerning to you. Please understand that not all results are received at the same time and often the doctors may need to interpret multiple results in order to provide you with the best plan of care or course of  treatment. Therefore, we ask that you please give [place] 2 business days to thoroughly review all your results before contacting the office for clarification. Should we see a critical lab result, you will be contacted sooner.     If You Need Anything After Your Visit   If you have any questions or concerns for your doctor, please call our main line at [phone]. If no one answers, please leave a voicemail as directed and we will return your call as soon as possible. Messages left after 4 pm will be answered the following business day.    You may also send [place] a message via MyChart. We typically respond to MyChart messages within 1-2 business days.  For prescription refills, please ask your pharmacy to contact our office. Our fax number is [phone].  If you have an urgent issue when the clinic is closed that cannot wait until the next business day, you can page your doctor at the number below.     Please note that while we do our best to be available for urgent issues outside of office hours, we are not available 24/7.    If you have an urgent issue and are unable to reach [place], you may choose to seek medical care at your doctor's office, retail clinic, urgent care center, or emergency room.   If you have a medical emergency, please immediately call 911 or go to the emergency department. In the event of inclement weather, please call our main line at [phone] for an update on the status of any delays or closures.  Dermatology Medication Tips: Please keep the boxes that topical medications come in in order to help keep track of the instructions about where and how to use these. Pharmacies typically print the medication instructions only on the boxes and not directly on the medication tubes.   If your medication is too expensive, please contact our office at [phone] or send [place] a message through MyChart.    We are unable to tell what your co-pay for medications will be in advance as  this is different depending on your insurance coverage. However, we may be able to find a substitute medication at lower cost or fill out paperwork to get insurance to cover a needed medication.    If a prior authorization is required to get your medication covered by your insurance company, please allow [place] 1-2 business days to complete this process.   Drug prices often vary depending on where the prescription is filled and some pharmacies may offer cheaper prices.   The website www.goodrx.com contains coupons for medications through different pharmacies. The prices here do not account for what the cost may be with help from insurance (it may be cheaper with your insurance), but the website can give you the price if you did not use any insurance.  - You can print the associated coupon and take it with your prescription to the pharmacy.  - You may also stop by our office during regular business hours and pick up a GoodRx coupon card.  - If you need your prescription sent electronically to a different pharmacy, notify our office through [facility] or by phone at [phone]

## 2023-05-06 ENCOUNTER — Other Ambulatory Visit: Payer: Self-pay | Admitting: Dermatology

## 2023-05-06 ENCOUNTER — Other Ambulatory Visit: Payer: Self-pay

## 2023-05-06 DIAGNOSIS — L281 Prurigo nodularis: Secondary | ICD-10-CM

## 2023-05-06 MED ORDER — PIMECROLIMUS 1 % EX CREA
TOPICAL_CREAM | Freq: Two times a day (BID) | CUTANEOUS | 0 refills | Status: DC
Start: 2023-05-06 — End: 2023-05-06

## 2023-05-25 ENCOUNTER — Ambulatory Visit (HOSPITAL_COMMUNITY): Admit: 2023-05-25 | Payer: Medicare Other | Admitting: Orthopedic Surgery

## 2023-05-25 SURGERY — ARTHROPLASTY, KNEE, TOTAL
Anesthesia: Choice | Site: Knee | Laterality: Right

## 2023-07-28 ENCOUNTER — Other Ambulatory Visit: Payer: Self-pay | Admitting: Internal Medicine

## 2023-08-08 NOTE — Progress Notes (Signed)
 Cardiology Office Note   Date:  08/10/2023   ID:  April, Walker Jun 06, 1951, MRN [id number]  PCP:  Stephane Leita DEL, MD  Cardiologist:   Vina Gull, MD   Pt presents for follow up of HTN, HL, CAD    History of Present Illness: April Walker is a 73 y.o. female with a history of HTN, HL, GERD, Breast CA    Followd by L Wile at [facility]   It saw the pt in 2022 for dyspnea.  EKG had inferior Q waves   Echo done showed LVEF and RVEF were normal, normal strain and normal diastolic function The pt also had a CCTA  in OCt 2022  Ca score was 91 (66%ile)   Mild nonobstructive CAD   I saw the pt in FeB 2024   Since seen she denies CP  Breathing is good   No dizziness   No palpitations  She admits to not being as active as she could be   Needs to get back to exercising     No outpatient medications have been marked as taking for the 08/10/23 encounter (Office Visit) with Gull Vina GAILS, MD.   Current Facility-Administered Medications for the 08/10/23 encounter (Office Visit) with Gull Vina GAILS, MD  Medication   bupivacaine -meloxicam  ER (ZYNRELEF ) injection 400 mg     Allergies:   Sulfonamide derivatives and Triprolidine-pseudoephedrine   Past Medical History:  Diagnosis Date   Abnormal cholesterol test    Allergy    seasonal -   Arthritis    Cancer (HCC) 2015   breast- left -chemo and radiation s/p lumpectomy   Cataract    removed both eyes   Diverticulosis    GERD (gastroesophageal reflux disease)    Hearing deficit    no hearing aids   History of colon polyps 12/09/2010   Hyperlipidemia    Hypertension    mild    Internal hemorrhoids    Knee pain, left    Personal history of chemotherapy    Personal history of radiation therapy    Radiation 05/02/14-06/19/14   Left Breast   Tubulovillous adenoma of colon    Wears glasses     Past Surgical History:  Procedure Laterality Date   BREAST EXCISIONAL BIOPSY Right 2010   BREAST LUMPECTOMY Left    2015    CATARACT EXTRACTION, BILATERAL  04/2014   bilateral dr octavia    COLONOSCOPY  2002   hemorrhoids   COLONOSCOPY W/ POLYPECTOMY  12/09/2010   1 cm sigmoid polyp - , diverticulosis, hemorrhoids   HEMORRHOID SURGERY     KNEE SURGERY     KNEE SURGERY  2012   Left knee arthroscopy   PARTIAL MASTECTOMY WITH NEEDLE LOCALIZATION AND AXILLARY SENTINEL LYMPH NODE BX Left 03/21/14   Dr.Faera Byerly   POLYPECTOMY     PORT-A-CATH REMOVAL Left 01/01/2015   Procedure: REMOVAL PORT-A-CATH;  Surgeon: Jina Nephew, MD;  Location: [facility];  Service: General;  Laterality: Left;   PORTACATH PLACEMENT Left 11/17/2013   Procedure: INSERTION PORT-A-CATH;  Surgeon: Jina Nephew, MD;  Location: [facility];  Service: General;  Laterality: Left;   right knee arthroscopy  2011 Dr. Margrette   TONSILLECTOMY AND ADENOIDECTOMY     TOTAL KNEE ARTHROPLASTY  11/23/2011   Procedure: TOTAL KNEE ARTHROPLASTY;  Surgeon: Taft FORBES Margrette, MD;  Location: [facility];  Service: Orthopedics;  Laterality: Left;   TREATMENT FISTULA ANAL     UPPER GASTROINTESTINAL ENDOSCOPY  Social History:  The patient  reports that she has never smoked. She has never used smokeless tobacco. She reports current alcohol use. She reports that she does not use drugs.   Family History:  The patient's family history includes Heart attack in her father; Stroke in her mother. She was adopted.    ROS:  Please see the history of present illness. All other systems are reviewed and  Negative to the above problem except as noted.    PHYSICAL EXAM: VS:  BP 120/86   Pulse 73   Ht 5' 8 (1.727 m)   Wt 228 lb 9.6 oz (103.7 kg)   SpO2 96%   BMI 34.76 kg/m   GEN: Well nourished, well developed, in no acute distress  HEENT: normal  Neck: no JVD, no carotid bruits Cardiac: RRR; no murmurs,  No LE  edema  Respiratory:  clear to auscultation GI: soft, nontender, No hepatomegaly  MS: no deformity Moving all extremities      EKG:  EKG is ordered today.  NSR  with occasional PACs   84  bpm  Q waves III, AVF      Lipid Panel No results found for: CHOL, TRIG, HDL, CHOLHDL, VLDL, LDLCALC, LDLDIRECT    Wt Readings from Last 3 Encounters:  08/10/23 228 lb 9.6 oz (103.7 kg)  03/01/23 219 lb (99.3 kg)  08/07/22 216 lb (98 kg)      ASSESSMENT AND PLAN:  1 CAD  Mild on CT angiogram in 2022  Pt remains symptom free     Rx risk factors     2 HTN  BP is well controlled    Continue losartan  3  HL  Last LDL in June 2024 was 90   Earlier in 2024 was 60   Not sure what changed   Would recomm another lipomed   4  Metabolics  A1C 5.8   Reviewed diet   Cut back on carbs   Increase activity         Tentative follow up in 1 year   Current medicines are reviewed at length with the patient today.  The patient does not have concerns regarding medicines.  Signed, Vina Gull, MD  08/10/2023 8:58 AM    [facility] [address], [place], [place]  [address] Phone: [phone]; Fax: [phone]

## 2023-08-10 ENCOUNTER — Ambulatory Visit: Payer: Medicare Other | Attending: Internal Medicine | Admitting: Internal Medicine

## 2023-08-10 VITALS — BP 120/86 | HR 73 | Ht 68.0 in | Wt 228.6 lb

## 2023-08-10 DIAGNOSIS — Z79899 Other long term (current) drug therapy: Secondary | ICD-10-CM

## 2023-08-10 DIAGNOSIS — E785 Hyperlipidemia, unspecified: Secondary | ICD-10-CM

## 2023-08-10 DIAGNOSIS — R931 Abnormal findings on diagnostic imaging of heart and coronary circulation: Secondary | ICD-10-CM

## 2023-08-10 DIAGNOSIS — I10 Essential (primary) hypertension: Secondary | ICD-10-CM | POA: Diagnosis not present

## 2023-08-10 NOTE — Patient Instructions (Addendum)
 Medication Instructions:   *If you need a refill on your cardiac medications before your next appointment, please call your pharmacy*   Lab Work: Nmr, cmet, vit d  If you have labs (blood work) drawn today and your tests are completely normal, you will receive your results only by: MyChart Message (if you have MyChart) OR A paper copy in the mail If you have any lab test that is abnormal or we need to change your treatment, we will call you to review the results.   Testing/Procedures:    Follow-Up: At [facility], you and your health needs are our priority.  As part of our continuing mission to provide you with exceptional heart care, we have created designated Provider Care Teams.  These Care Teams include your primary Cardiologist (physician) and Advanced Practice Providers (APPs -  Physician Assistants and Nurse Practitioners) who all work together to provide you with the care you need, when you need it.  We recommend signing up for the patient portal called MyChart.  Sign up information is provided on this After Visit Summary.  MyChart is used to connect with patients for Virtual Visits (Telemedicine).  Patients are able to view lab/test results, encounter notes, upcoming appointments, etc.  Non-urgent messages can be sent to your provider as well.   To learn more about what you can do with MyChart, go to [email or web address].    Your next appointment: one year

## 2023-08-11 LAB — COMPREHENSIVE METABOLIC PANEL
ALT: 12 [IU]/L (ref 0–32)
AST: 18 [IU]/L (ref 0–40)
Albumin: 4.3 g/dL (ref 3.8–4.8)
Alkaline Phosphatase: 104 [IU]/L (ref 44–121)
BUN/Creatinine Ratio: 18 (ref 12–28)
BUN: 13 mg/dL (ref 8–27)
Bilirubin Total: 0.5 mg/dL (ref 0.0–1.2)
CO2: 25 mmol/L (ref 20–29)
Calcium: 9.6 mg/dL (ref 8.7–10.3)
Chloride: 104 mmol/L (ref 96–106)
Creatinine, Ser: 0.73 mg/dL (ref 0.57–1.00)
Globulin, Total: 2.2 g/dL (ref 1.5–4.5)
Glucose: 99 mg/dL (ref 70–99)
Potassium: 3.8 mmol/L (ref 3.5–5.2)
Sodium: 144 mmol/L (ref 134–144)
Total Protein: 6.5 g/dL (ref 6.0–8.5)
eGFR: 86 mL/min/{1.73_m2} (ref >59)

## 2023-08-11 LAB — NMR, LIPOPROFILE
Cholesterol, Total: 170 mg/dL (ref 100–199)
HDL Particle Number: 42.5 umol/L (ref >=30.5)
HDL-C: 65 mg/dL (ref >39)
LDL Particle Number: 876 nmol/L (ref <1000)
LDL Size: 20.3 nm — ABNORMAL LOW (ref >20.5)
LDL-C (NIH Calc): 78 mg/dL (ref 0–99)
LP-IR Score: 44 (ref <=45)
Small LDL Particle Number: 545 nmol/L — ABNORMAL HIGH (ref <=527)
Triglycerides: 159 mg/dL — ABNORMAL HIGH (ref 0–149)

## 2023-08-11 LAB — VITAMIN D 25 HYDROXY (VIT D DEFICIENCY, FRACTURES): Vit D, 25-Hydroxy: 43.2 ng/mL (ref 30.0–100.0)

## 2023-08-13 ENCOUNTER — Encounter: Payer: Self-pay | Admitting: Internal Medicine

## 2023-08-13 ENCOUNTER — Other Ambulatory Visit: Payer: Self-pay

## 2023-08-13 MED ORDER — VITAMIN D3 1000 UNITS PO CAPS: 1000.0000 [IU] | ORAL_CAPSULE | Freq: Every day | ORAL | 3 refills | Status: AC

## 2023-09-02 ENCOUNTER — Encounter: Payer: Self-pay | Admitting: Dermatology

## 2023-09-02 ENCOUNTER — Ambulatory Visit: Payer: Medicare Other | Admitting: Dermatology

## 2023-09-02 VITALS — BP 134/83

## 2023-09-02 DIAGNOSIS — D492 Neoplasm of unspecified behavior of bone, soft tissue, and skin: Secondary | ICD-10-CM

## 2023-09-02 DIAGNOSIS — L281 Prurigo nodularis: Secondary | ICD-10-CM

## 2023-09-02 DIAGNOSIS — D485 Neoplasm of uncertain behavior of skin: Secondary | ICD-10-CM

## 2023-09-02 NOTE — Patient Instructions (Addendum)

## 2023-09-02 NOTE — Progress Notes (Signed)
   Follow-Up Visit   Subjective  April Walker is a 73 y.o. female who presents for the following: AK  Patient present today for follow up visit. Patient was last evaluated on 05/04/23. 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 Patient reports sxs are better. Patient denies medication changes.  The following portions of the chart were reviewed this encounter and updated as appropriate: medications, allergies, medical history  Review of Systems:  No other skin or systemic complaints except as noted in HPI or Assessment and Plan.  Objective  Well appearing patient in no apparent distress; mood and affect are within normal limits.   A focused examination was performed of the following areas: face   Relevant exam findings are noted in the Assessment and Plan.        Left Buccal Cheek 6mm crusted papule Right Melolabial Fold 6mm crusted papule   NEOPLASM OF UNCERTAIN BEHAVIOR OF SKIN (2) Left Buccal Cheek Skin / nail biopsy Specimen 1 - Surgical pathology Differential Diagnosis: r/o SCC  Check Margins: yes Right Melolabial Fold Skin / nail biopsy Type of biopsy: tangential   Informed consent: discussed and consent obtained   Timeout: patient name, date of birth, surgical site, and procedure verified   Procedure prep:  Patient was prepped and draped in usual sterile fashion Prep type:  Isopropyl alcohol Anesthesia: the lesion was anesthetized in a standard fashion   Anesthetic:  1% lidocaine w/ epinephrine 1-100,000 buffered w/ 8.4% NaHCO3 Instrument used: DermaBlade   Hemostasis achieved with: aluminum chloride   Outcome: patient tolerated procedure well   Post-procedure details: sterile dressing applied and wound care instructions given   Dressing type: petrolatum gauze and bandage   Specimen 2 - Surgical pathology Differential Diagnosis: r/o SCC vs BCC  Check Margins: yes   Return in about 6 months (around  03/01/2024) for TBSE.    Documentation: I have reviewed the above documentation for accuracy and completeness, and I agree with the above.   I, Adylin Hankey Marcha Solders, CMA, am acting as scribe for [employer], DO.   Langston Reusing, DO

## 2023-09-06 LAB — SURGICAL PATHOLOGY

## 2023-09-07 ENCOUNTER — Encounter: Payer: Self-pay | Admitting: Dermatology

## 2023-09-07 NOTE — Progress Notes (Signed)
 Hi April Walker  Dr. Onalee Hua reviewed your biopsy results and they showed the spot removed was benign (not cancerous).  No additional treatment is required.  The detailed report is available to view in MyChart.  Have a great day!  Kind Regards,  Dr. Kermit Balo Care Team

## 2023-10-15 ENCOUNTER — Other Ambulatory Visit: Payer: Self-pay | Admitting: Internal Medicine

## 2023-10-15 DIAGNOSIS — Z1231 Encounter for screening mammogram for malignant neoplasm of breast: Secondary | ICD-10-CM

## 2023-10-18 ENCOUNTER — Other Ambulatory Visit: Payer: Self-pay | Admitting: Internal Medicine

## 2023-10-23 ENCOUNTER — Other Ambulatory Visit: Payer: Self-pay | Admitting: Internal Medicine

## 2023-11-30 ENCOUNTER — Ambulatory Visit

## 2023-12-02 ENCOUNTER — Ambulatory Visit
Admission: RE | Admit: 2023-12-02 | Discharge: 2023-12-02 | Disposition: A | Discharge status: Home / Self Care | Source: Ambulatory Visit | Attending: Internal Medicine | Admitting: Internal Medicine

## 2023-12-02 DIAGNOSIS — Z1231 Encounter for screening mammogram for malignant neoplasm of breast: Secondary | ICD-10-CM

## 2024-03-09 ENCOUNTER — Ambulatory Visit: Payer: Medicare Other | Admitting: Dermatology

## 2024-03-09 ENCOUNTER — Encounter: Payer: Self-pay | Admitting: Dermatology

## 2024-03-09 VITALS — BP 143/88 | HR 88

## 2024-03-09 DIAGNOSIS — W908XXA Exposure to other nonionizing radiation, initial encounter: Secondary | ICD-10-CM

## 2024-03-09 DIAGNOSIS — L814 Other melanin hyperpigmentation: Secondary | ICD-10-CM | POA: Diagnosis not present

## 2024-03-09 DIAGNOSIS — D229 Melanocytic nevi, unspecified: Secondary | ICD-10-CM

## 2024-03-09 DIAGNOSIS — L821 Other seborrheic keratosis: Secondary | ICD-10-CM | POA: Diagnosis not present

## 2024-03-09 DIAGNOSIS — Z1283 Encounter for screening for malignant neoplasm of skin: Secondary | ICD-10-CM | POA: Diagnosis not present

## 2024-03-09 DIAGNOSIS — L578 Other skin changes due to chronic exposure to nonionizing radiation: Secondary | ICD-10-CM

## 2024-03-09 DIAGNOSIS — D1801 Hemangioma of skin and subcutaneous tissue: Secondary | ICD-10-CM | POA: Diagnosis not present

## 2024-03-09 DIAGNOSIS — L281 Prurigo nodularis: Secondary | ICD-10-CM

## 2024-03-09 NOTE — Progress Notes (Signed)
   Total Body Skin Exam (TBSE) Visit   Subjective  April Walker is a 73 y.o. female who presents for the following: Skin Cancer Screening and Full Body Skin Exam  Patient presents today for follow up visit for TBSE. Patient was last evaluated on Feb. 27th 2025 . Patient denies medication changes. Patient reports she does not have spots, moles and lesions of concern to be evaluated. Patient reports throughout her lifetime she has had moderate sun exposure. Currently, patient reports if she has excessive sun exposure, she does apply sunscreen and/or wears protective coverings. 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 Patient denies  family history of skin cancers (she is adopted)  The following portions of the chart were reviewed this encounter and updated as appropriate: medications, allergies, medical history  Review of Systems:  No other skin or systemic complaints except as noted in HPI or Assessment and Plan.  Objective  Well appearing patient in no apparent distress; mood and affect are within normal limits.  A full examination was performed including scalp, head, eyes, ears, nose, lips, neck, chest, axillae, abdomen, back, buttocks, bilateral upper extremities, bilateral lower extremities, hands, feet, fingers, toes, fingernails, and toenails. All findings within normal limits unless otherwise noted below.   Relevant physical exam findings are noted in the Assessment and Plan.    Assessment & Plan   LENTIGINES, SEBORRHEIC KERATOSES, HEMANGIOMAS - Benign normal skin lesions - Benign-appearing - Call for any changes  MELANOCYTIC NEVI - Tan-brown and/or pink-flesh-colored symmetric macules and papules - Benign appearing on exam today - Observation - Call clinic for new or changing moles - Recommend daily use of broad spectrum spf 30+ sunscreen to sun-exposed areas.   MILD ACTINIC DAMAGE - Chronic condition, secondary to cumulative UV/sun  exposure - diffuse scaly erythematous macules with underlying dyspigmentation - Recommend daily broad spectrum sunscreen SPF 30+ to sun-exposed areas, reapply every 2 hours as needed.  - Staying in the shade or wearing long sleeves, sun glasses (UVA+UVB protection) and wide brim hats (4-inch brim around the entire circumference of the hat) are also recommended for sun protection.  - Call for new or changing lesions.  SKIN CANCER SCREENING PERFORMED TODAY.     Return in about 1 year (around 03/09/2025) for TBSE.  I, Doyce Pan, CMA, am acting as scribe for [employer], DO.   Documentation: I have reviewed the above documentation for accuracy and completeness, and I agree with the above.  Delon Lenis, DO

## 2024-03-09 NOTE — Patient Instructions (Signed)

## 2024-05-30 ENCOUNTER — Encounter: Payer: Self-pay | Admitting: Internal Medicine

## 2024-08-16 ENCOUNTER — Ambulatory Visit: Admitting: Internal Medicine

## 2024-08-17 ENCOUNTER — Ambulatory Visit: Admitting: Internal Medicine

## 2025-03-13 ENCOUNTER — Ambulatory Visit: Admitting: Dermatology
